# Patient Record
Sex: Female | Born: 1940
Health system: Southern US, Community
[De-identification: ages and names within clinical notes are randomized; demographics above are authoritative.]

## PROBLEM LIST (undated history)

## (undated) DIAGNOSIS — IMO0001 Reserved for inherently not codable concepts without codable children: Secondary | ICD-10-CM

## (undated) DIAGNOSIS — M316 Other giant cell arteritis: Secondary | ICD-10-CM

## (undated) DIAGNOSIS — IMO0002 Reserved for concepts with insufficient information to code with codable children: Secondary | ICD-10-CM

## (undated) DIAGNOSIS — C50919 Malignant neoplasm of unspecified site of unspecified female breast: Secondary | ICD-10-CM

## (undated) HISTORY — DX: Reserved for inherently not codable concepts without codable children: IMO0001

## (undated) HISTORY — DX: Reserved for concepts with insufficient information to code with codable children: IMO0002

## (undated) HISTORY — PX: TUBAL LIGATION: SHX77

---

## 2006-01-25 ENCOUNTER — Emergency Department (HOSPITAL_COMMUNITY): Admission: EM | Admit: 2006-01-25 | Discharge: 2006-01-25 | Payer: Self-pay | Admitting: *Deleted

## 2006-01-26 ENCOUNTER — Emergency Department (HOSPITAL_COMMUNITY): Admission: EM | Admit: 2006-01-26 | Discharge: 2006-01-26 | Payer: Self-pay | Admitting: Emergency Medicine

## 2013-12-26 ENCOUNTER — Other Ambulatory Visit: Payer: Self-pay

## 2013-12-26 ENCOUNTER — Encounter (HOSPITAL_COMMUNITY): Payer: Self-pay | Admitting: Emergency Medicine

## 2013-12-26 ENCOUNTER — Ambulatory Visit: Payer: Medicare HMO

## 2013-12-26 ENCOUNTER — Observation Stay (HOSPITAL_COMMUNITY)
Admission: EM | Admit: 2013-12-26 | Discharge: 2013-12-28 | Disposition: A | Discharge status: Home / Self Care | Payer: Medicare HMO | Attending: Internal Medicine | Admitting: Internal Medicine

## 2013-12-26 ENCOUNTER — Ambulatory Visit (INDEPENDENT_AMBULATORY_CARE_PROVIDER_SITE_OTHER): Payer: Medicare HMO | Admitting: Emergency Medicine

## 2013-12-26 VITALS — BP 104/68 | HR 121 | Temp 99.2°F | Resp 18 | Ht 65.0 in | Wt 138.6 lb

## 2013-12-26 DIAGNOSIS — Z7982 Long term (current) use of aspirin: Secondary | ICD-10-CM | POA: Insufficient documentation

## 2013-12-26 DIAGNOSIS — R6884 Jaw pain: Secondary | ICD-10-CM | POA: Insufficient documentation

## 2013-12-26 DIAGNOSIS — R5383 Other fatigue: Secondary | ICD-10-CM

## 2013-12-26 DIAGNOSIS — R51 Headache: Secondary | ICD-10-CM

## 2013-12-26 DIAGNOSIS — N63 Unspecified lump in unspecified breast: Secondary | ICD-10-CM

## 2013-12-26 DIAGNOSIS — N6320 Unspecified lump in the left breast, unspecified quadrant: Secondary | ICD-10-CM

## 2013-12-26 DIAGNOSIS — R9431 Abnormal electrocardiogram [ECG] [EKG]: Secondary | ICD-10-CM | POA: Diagnosis present

## 2013-12-26 DIAGNOSIS — R634 Abnormal weight loss: Secondary | ICD-10-CM

## 2013-12-26 DIAGNOSIS — M316 Other giant cell arteritis: Principal | ICD-10-CM | POA: Diagnosis present

## 2013-12-26 DIAGNOSIS — R5381 Other malaise: Secondary | ICD-10-CM | POA: Insufficient documentation

## 2013-12-26 LAB — CBC WITH DIFFERENTIAL/PLATELET
Basophils Absolute: 0 10*3/uL (ref 0.0–0.1)
Basophils Relative: 0 % (ref 0–1)
EOS PCT: 0 % (ref 0–5)
Eosinophils Absolute: 0 10*3/uL (ref 0.0–0.7)
HEMATOCRIT: 34.8 % — AB (ref 36.0–46.0)
Hemoglobin: 12 g/dL (ref 12.0–15.0)
LYMPHS ABS: 1 10*3/uL (ref 0.7–4.0)
LYMPHS PCT: 12 % (ref 12–46)
MCH: 29.4 pg (ref 26.0–34.0)
MCHC: 34.5 g/dL (ref 30.0–36.0)
MCV: 85.3 fL (ref 78.0–100.0)
MONO ABS: 1 10*3/uL (ref 0.1–1.0)
Monocytes Relative: 12 % (ref 3–12)
Neutro Abs: 6.1 10*3/uL (ref 1.7–7.7)
Neutrophils Relative %: 76 % (ref 43–77)
Platelets: 395 10*3/uL (ref 150–400)
RBC: 4.08 MIL/uL (ref 3.87–5.11)
RDW: 12.6 % (ref 11.5–15.5)
WBC: 8.1 10*3/uL (ref 4.0–10.5)

## 2013-12-26 LAB — URINALYSIS, ROUTINE W REFLEX MICROSCOPIC
Bilirubin Urine: NEGATIVE
Glucose, UA: NEGATIVE mg/dL
Hgb urine dipstick: NEGATIVE
KETONES UR: 15 mg/dL — AB
Leukocytes, UA: NEGATIVE
NITRITE: NEGATIVE
PH: 8 (ref 5.0–8.0)
Protein, ur: NEGATIVE mg/dL
Specific Gravity, Urine: 1.009 (ref 1.005–1.030)
UROBILINOGEN UA: 1 mg/dL (ref 0.0–1.0)

## 2013-12-26 LAB — POCT URINALYSIS DIPSTICK
BILIRUBIN UA: NEGATIVE
Blood, UA: NEGATIVE
Glucose, UA: NEGATIVE
KETONES UA: 15
LEUKOCYTES UA: NEGATIVE
Nitrite, UA: NEGATIVE
PROTEIN UA: 30
SPEC GRAV UA: 1.01
Urobilinogen, UA: 1
pH, UA: 7

## 2013-12-26 LAB — COMPREHENSIVE METABOLIC PANEL
ALT: 11 U/L (ref 0–35)
AST: 20 U/L (ref 0–37)
Albumin: 3.2 g/dL — ABNORMAL LOW (ref 3.5–5.2)
Alkaline Phosphatase: 92 U/L (ref 39–117)
BUN: 9 mg/dL (ref 6–23)
CO2: 23 meq/L (ref 19–32)
CREATININE: 0.73 mg/dL (ref 0.50–1.10)
Calcium: 9.5 mg/dL (ref 8.4–10.5)
Chloride: 96 mEq/L (ref 96–112)
GFR, EST NON AFRICAN AMERICAN: 83 mL/min — AB (ref >90)
GLUCOSE: 119 mg/dL — AB (ref 70–99)
Potassium: 3.8 mEq/L (ref 3.7–5.3)
Sodium: 137 mEq/L (ref 137–147)
Total Bilirubin: 0.6 mg/dL (ref 0.3–1.2)
Total Protein: 7.3 g/dL (ref 6.0–8.3)

## 2013-12-26 LAB — POCT CBC
GRANULOCYTE PERCENT: 72.9 % (ref 37–80)
HEMATOCRIT: 39.6 % (ref 37.7–47.9)
Hemoglobin: 12.5 g/dL (ref 12.2–16.2)
Lymph, poc: 1.7 (ref 0.6–3.4)
MCH: 28.2 pg (ref 27–31.2)
MCHC: 31.6 g/dL — AB (ref 31.8–35.4)
MCV: 89.5 fL (ref 80–97)
MID (CBC): 0.8 (ref 0–0.9)
MPV: 8.6 fL (ref 0–99.8)
PLATELET COUNT, POC: 416 10*3/uL (ref 142–424)
POC Granulocyte: 6.8 (ref 2–6.9)
POC LYMPH %: 18.1 % (ref 10–50)
POC MID %: 9 %M (ref 0–12)
RBC: 4.43 M/uL (ref 4.04–5.48)
RDW, POC: 13.6 %
WBC: 9.3 10*3/uL (ref 4.6–10.2)

## 2013-12-26 LAB — TROPONIN I
Troponin I: <0.3 ng/mL (ref <0.30)
Troponin I: <0.3 ng/mL (ref <0.30)

## 2013-12-26 LAB — TSH: TSH: 2.428 u[IU]/mL (ref 0.350–4.500)

## 2013-12-26 LAB — T4, FREE: Free T4: 1.18 ng/dL (ref 0.80–1.80)

## 2013-12-26 LAB — POCT SEDIMENTATION RATE: POCT SED RATE: 80 mm/hr — AB (ref 0–22)

## 2013-12-26 MED ORDER — PREDNISONE 50 MG PO TABS
60.0000 mg | ORAL_TABLET | Freq: Every day | ORAL | Status: DC
  Administered 2013-12-27 – 2013-12-28 (×2): 60 mg via ORAL
  Filled 2013-12-26 (×3): qty 1

## 2013-12-26 MED ORDER — ASPIRIN EC 81 MG PO TBEC
162.0000 mg | DELAYED_RELEASE_TABLET | Freq: Every day | ORAL | Status: DC
  Administered 2013-12-26 – 2013-12-27 (×2): 162 mg via ORAL
  Filled 2013-12-26 (×3): qty 2

## 2013-12-26 MED ORDER — HEPARIN SODIUM (PORCINE) 5000 UNIT/ML IJ SOLN
5000.0000 [IU] | Freq: Three times a day (TID) | INTRAMUSCULAR | Status: DC
  Filled 2013-12-26 (×8): qty 1

## 2013-12-26 MED ORDER — SODIUM CHLORIDE 0.9 % IJ SOLN: 3.0000 mL | Freq: Two times a day (BID) | INTRAMUSCULAR | Status: DC

## 2013-12-26 MED ORDER — PREDNISONE 20 MG PO TABS
60.0000 mg | ORAL_TABLET | Freq: Once | ORAL | Status: AC
  Administered 2013-12-26: 60 mg via ORAL
  Filled 2013-12-26: qty 3

## 2013-12-26 MED ORDER — SODIUM CHLORIDE 0.9 % IV SOLN
1000.0000 mg | Freq: Once | INTRAVENOUS | Status: DC
  Filled 2013-12-26: qty 8

## 2013-12-26 MED ORDER — SODIUM CHLORIDE 0.9 % IV BOLUS (SEPSIS)
1000.0000 mL | Freq: Once | INTRAVENOUS | Status: AC
  Administered 2013-12-26: 1000 mL via INTRAVENOUS

## 2013-12-26 MED ORDER — SODIUM CHLORIDE 0.9 % IV SOLN
INTRAVENOUS | Status: DC
  Administered 2013-12-26 (×2): via INTRAVENOUS

## 2013-12-26 NOTE — ED Notes (Signed)
Pt alert, NAD, calm, interactive, skin W&D, resps e/u, speaking in clear complete sentences, ("feel about the same, not better, not worse"), family at [facility].

## 2013-12-26 NOTE — ED Notes (Signed)
Pt went to [facility] to be seen for weakness that started in January. States that weakness is not getting worse just getting tired of it and never had a PCP in last 20 yrs. Loss of appetite, increased weakness, lethargy, and lost 40 lbs. EMS Sinus tach 115, BP 136/102, Resp 18, 99% room air, CBG 154. Alert/Oriented x4

## 2013-12-26 NOTE — ED Notes (Signed)
EKG completed given to EDP.  

## 2013-12-26 NOTE — H&P (Signed)
[facility] History and Physical  CARRINE KROBOTH NTI:[id number] DOB: Nov 16, 1940 DOA: 12/26/2013  Referring physician: EDP PCP: No primary provider on file.   Chief Complaint: Fatigue, weight loss   HPI: Lindsey [name] is a 73 y.o. female who presented to UC earlier today with c/o night sweats, loss of appetite, weakness, cold intolerance.  Symptoms onset mid Jan.  Also noticed lump in L breast, soreness in temples and prominence of vasculature in temples.  Never had a mammogram nor screening colonoscopy, last check up with doctor several years ago.  Review of Systems: No chest pain, no SOB, no DOE, Systems reviewed.  As above, otherwise negative  History reviewed. No pertinent past medical history. Past Surgical History  Procedure Laterality Date  . Tubal ligation     Social History:  reports that she has never smoked. She has never used smokeless tobacco. She reports that she drinks alcohol. She reports that she does not use illicit drugs.  No Known Allergies  Family History  Problem Relation Age of Onset  . Diabetes Paternal Uncle      Prior to Admission medications   Medication Sig Start Date End Date Taking? Authorizing Provider  aspirin EC 81 MG tablet Take 162 mg by mouth at bedtime.   Yes Historical Provider, MD  Multiple Vitamins-Minerals (MULTIVITAMIN WITH MINERALS) tablet Take 1 tablet by mouth daily.   Yes Historical Provider, MD   Physical Exam: Filed Vitals:   12/26/13 1912  BP: 150/83  Pulse: 99  Temp: 98.3 F (36.8 C)  Resp: 17    BP 150/83  Pulse 99  Temp(Src) 98.3 F (36.8 C) (Oral)  Resp 17  Ht $R'5\' 6"'VY$  (1.676 m)  Wt 62.596 kg (138 lb)  BMI 22.28 kg/m2  SpO2 99%  General Appearance:    Alert, oriented, no distress, appears stated age  Head:    Normocephalic, atraumatic, Prominance of the temporal arteries, bilaterally but especially so on the R side, tender.  Eyes:    PERRL, EOMI, sclera non-icteric        Nose:   Nares without drainage  or epistaxis. Mucosa, turbinates normal  Throat:   Moist mucous membranes. Oropharynx without erythema or exudate.  Neck:   Supple. No carotid bruits.  No thyromegaly.  No lymphadenopathy.   Back:     No CVA tenderness, no spinal tenderness  Lungs:     Clear to auscultation bilaterally, without wheezes, rhonchi or rales  Chest wall:    No tenderness to palpitation, 6x6 cm mobile mass superior left breast.  Heart:    Regular rate and rhythm without murmurs, gallops, rubs  Abdomen:     Soft, non-tender, nondistended, normal bowel sounds, no organomegaly  Genitalia:    deferred  Rectal:    deferred  Extremities:   No clubbing, cyanosis or edema.  Pulses:   2+ and symmetric all extremities  Skin:   Skin color, texture, turgor normal, no rashes or lesions  Lymph nodes:   Cervical, supraclavicular, and axillary nodes normal  Neurologic:   CNII-XII intact. Normal strength, sensation and reflexes      throughout    Labs on Admission:  Basic Metabolic Panel:  Recent Labs Lab 12/26/13 1600  NA 137  K 3.8  CL 96  CO2 23  GLUCOSE 119*  BUN 9  CREATININE 0.73  CALCIUM 9.5   Liver Function Tests:  Recent Labs Lab 12/26/13 1600  AST 20  ALT 11  ALKPHOS 92  BILITOT 0.6  PROT  7.3  ALBUMIN 3.2*   No results found for this basename: LIPASE, AMYLASE,  in the last 168 hours No results found for this basename: AMMONIA,  in the last 168 hours CBC:  Recent Labs Lab 12/26/13 1343 12/26/13 1600  WBC 9.3 8.1  NEUTROABS  --  6.1  HGB 12.5 12.0  HCT 39.6 34.8*  MCV 89.5 85.3  PLT  --  395   Cardiac Enzymes:  Recent Labs Lab 12/26/13 1600  TROPONINI <0.30    BNP (last 3 results) No results found for this basename: PROBNP,  in the last 8760 hours CBG: No results found for this basename: GLUCAP,  in the last 168 hours  Radiological Exams on Admission: Dg Chest 2 View  12/26/2013   CLINICAL DATA:  Left breast mass.  Weight loss.  EXAM: CHEST  2 VIEW  COMPARISON:  None.   FINDINGS: Cardiac silhouette normal in size. Thoracic aorta mildly atherosclerotic. Hilar and mediastinal contours otherwise unremarkable. Lungs hyperinflated. Lungs clear. Bronchovascular markings normal. Pulmonary vascularity normal. No visible pleural effusions. No pneumothorax. Calcification of the central bronchial cartilages. Degenerative changes involving the thoracic spine.  IMPRESSION: Hyperinflation consistent with COPD and/or asthma. No acute cardiopulmonary disease.  No discrepancy with the original interpretation by Dr. Everlene Farrier.   Electronically Signed   By: Evangeline Dakin M.D.   On: 12/26/2013 15:05    EKG: Independently reviewed.  Demonstrates Anterior T wave inversions.  Assessment/Plan Principal Problem:   Temporal arteritis Active Problems:   Breast mass, left   Abnormal resting ECG findings   1. Temporal arteritis - elevated ESR, temporal artery prominance, and symptoms suspicious for temporal arteritis.  Empiric treatment with prednisone 13m daily, first dose now.  Will need temporal artery biopsy. 2. Breast mass, left - diagnostic mammography ordered, may need [place]/ biopsy as well depending on findings. 3. Abnormal resting ECG - serial troponins ordered, patient NPO after midnight in case stress test is desired.    Code Status: Full  Family Communication: [name] Disposition Plan: Admit to inpatient   Time spent: 768min  Crystle Carelli M. [facility] Pager [phone] If 7AM-7PM, please contact the day team taking care of the patient [email or web address] Password [facility]2/16/2015, 8:05 PM

## 2013-12-26 NOTE — ED Provider Notes (Signed)
Medical screening examination/treatment/procedure(s) were conducted as a shared visit with non-physician practitioner(s) and myself.  I personally evaluated the patient during the encounter.    Date: 12/26/2013  Rate: 107  Rhythm: sinus tachycardia  QRS Axis: normal  Intervals: normal  ST/T Wave abnormalities: nonspecific ST/T changes  Conduction Disutrbances:none  Narrative Interpretation:   Old EKG Reviewed: none available    Patient with multiple problems. Her exam is concerning for temporal arteritis, will start on high dose steroids. Her weight loss and breast mass with anorexia are concerning for breast cancer. She has no follow up at this time and never sees a doctor. She also has exertional fatigue with concerning EKG. Could be related to the likely cancer, but with nonspecific ST and T waves I will admit for serial troponins, EKGs and cardiac w/u in addition to her other problems.  Ephraim Hamburger, MD 12/26/13 [phone]

## 2013-12-26 NOTE — Progress Notes (Signed)
Subjective:    Patient ID: Lindsey [name], female    DOB: 03-Aug-1941, 73 y.o.   MRN: [id number]  HPI 73 y.o. Female widow presents to clinic with night sweats, loss of appetite,weakness, cold intolerance since mid January. States that she often has to get up at night to change pajamas due to these night sweats. Has noticed herself having some indigestion and soft/lose stools. Reports that she doesn't have a family dr. Shann Medal that she noticed a lump in her left breast, soreness in her temples and her face breaking out. States that she hasn't been seen for these symptoms before. Has never had a mammogram or screening colonoscopy. States that last check up with a doctor was several years ago.    Review of Systems     Objective:   Physical Exam patient appears ill but not toxic. She has bilateral prominent temporal arteries with multiple branches. They're tender to touch. Her neck is supple chest is clear. Cardiac revealed a very rapid rhythm slightly irregular. There is a 6 x 6 cm mobile mass superior left breast above the areola. There is a pigmented lesion left anterior chest and also left mid abdomen. There are no evident dominant masses palpable. Extremities are without edema. Results for orders placed in visit on 12/26/13  POCT CBC      Result Value Ref Range   WBC 9.3  4.6 - 10.2 K/uL   Lymph, poc 1.7  0.6 - 3.4   POC LYMPH PERCENT 18.1  10 - 50 %L   MID (cbc) 0.8  0 - 0.9   POC MID % 9.0  0 - 12 %M   POC Granulocyte 6.8  2 - 6.9   Granulocyte percent 72.9  37 - 80 %G   RBC 4.43  4.04 - 5.48 M/uL   Hemoglobin 12.5  12.2 - 16.2 g/dL   HCT, POC 39.6  37.7 - 47.9 %   MCV 89.5  80 - 97 fL   MCH, POC 28.2  27 - 31.2 pg   MCHC 31.6 (*) 31.8 - 35.4 g/dL   RDW, POC 13.6     Platelet Count, POC 416  142 - 424 K/uL   MPV 8.6  0 - 99.8 fL  POCT URINALYSIS DIPSTICK      Result Value Ref Range   Color, UA yellow     Clarity, UA clear     Glucose, UA neg     Bilirubin, UA neg     Ketones, UA 15     Spec Grav, UA 1.010     Blood, UA neg     pH, UA 7.0     Protein, UA 30     Urobilinogen, UA 1.0     Nitrite, UA neg     Leukocytes, UA Negative     [facility] reading (PRIMARY) by  Dr. Everlene Farrier is an increased AP diameter but I do not see any lung masses. EKG sinus tachycardia wit rate of 114 your is borderline short and there is diffuse ST changes 23 aVF V1 to V3       Assessment & Plan:  Problem #1 prominent temporal arteries suspicious for temporal arteritis problem #2 weight loss of approximately 40 pounds since last summer problem #3 abnormal EKG with tachycardia and diffuse ST-T changes. Problem #4 mass of the left breast approximately 6 x 7 cm problem #5 no health screening in the last 25 years for her multiple medical problems that could explain her situation. The  prominent temporal arteries and what appears to be a rising sedimentation rate could be related to temporal arteritis the weight loss is very worrisome but possibly could be secondary to thyroid disease. The mass in her left breast needs mammography and biopsy if needed. I did call the daughter at [phone] whose name is Wells Guiles and advised her of the situation . The patient also has 2 pigmented lesions on the abdomen which need to be biopsied. The patient's chest x-ray looked like COPD but I did not see any definite masses the reading from the radiologist is still pending

## 2013-12-26 NOTE — ED Provider Notes (Signed)
CSN: [id number]     Arrival date & time 12/26/13  1503 History   First MD Initiated Contact with Patient 12/26/13 1533     Chief Complaint  Patient presents with  . Abnormal ECG  . Weakness     (Consider location/radiation/quality/duration/timing/severity/associated sxs/prior Treatment) HPI 73 yo female presents by referral from UC with multiple complaints of night sweats, weight loss (23-24 lbs), and weakness since January. Admits to cold interolance "for years". Admits to "indigestion" and few intermittent episodes of loose stools. Denies Nausea, vomiting, constipation. Denies chest pain, shortness of breath, HA, dizziness, visual changes, fever. Admits to red rash on chest in the past week. Denies urinary sxs. Denies bloody or painful stools. Admits to difficulty opening jaw over the past 3 days. Denies alcohol or tobacco use.  Patient has not ever had screening mammogram or colonoscopy. Patient denies having seen a doctor in "years".    History reviewed. No pertinent past medical history. Past Surgical History  Procedure Laterality Date  . Tubal ligation     Family History  Problem Relation Age of Onset  . Diabetes Paternal Uncle    History  Substance Use Topics  . Smoking status: Never Smoker   . Smokeless tobacco: Never Used  . Alcohol Use: Yes     Comment: rare occaisons glass of wine   OB History   Grav Para Term Preterm Abortions TAB SAB Ect Mult Living                 Review of Systems  All other systems reviewed and are negative.      Allergies  Review of patient's allergies indicates no known allergies.  Home Medications   Current Outpatient Rx  Name  Route  Sig  Dispense  Refill  . aspirin EC 81 MG tablet   Oral   Take 162 mg by mouth at bedtime.         . Multiple Vitamins-Minerals (MULTIVITAMIN WITH MINERALS) tablet   Oral   Take 1 tablet by mouth daily.          BP 140/89  Pulse 104  Temp(Src) 98.3 F (36.8 C) (Oral)  Resp 25  Ht 5'  6" (1.676 m)  Wt 138 lb (62.596 kg)  BMI 22.28 kg/m2  SpO2 99% Physical Exam  Nursing note and vitals reviewed. Constitutional: She is oriented to person, place, and time. She appears well-developed and well-nourished. No distress.  HENT:  Head: Normocephalic and atraumatic.    Right Ear: Tympanic membrane and ear canal normal. No drainage. Tympanic membrane is not erythematous.  Left Ear: Tympanic membrane and ear canal normal. No drainage. Tympanic membrane is not erythematous.  Nose: Nose normal. Right sinus exhibits no maxillary sinus tenderness and no frontal sinus tenderness. Left sinus exhibits no maxillary sinus tenderness and no frontal sinus tenderness.  Mouth/Throat: Uvula is midline, oropharynx is clear and moist and mucous membranes are normal. There is trismus in the jaw. No uvula swelling. No oropharyngeal exudate, posterior oropharyngeal edema or posterior oropharyngeal erythema.  Eyes: Conjunctivae and EOM are normal. Pupils are equal, round, and reactive to light. Right eye exhibits no discharge. Left eye exhibits no discharge. No scleral icterus.  Neck: Normal range of motion and phonation normal. Neck supple. No JVD present. No rigidity. No tracheal deviation, no edema and no erythema present.  Cardiovascular: Normal rate, regular rhythm and intact distal pulses.  Exam reveals no gallop and no friction rub.   No murmur heard. Pulmonary/Chest: Effort normal  and breath sounds normal. No stridor. No respiratory distress. She has no decreased breath sounds. She has no wheezes. She has no rhonchi. She has no rales. Right breast exhibits no inverted nipple, no mass, no nipple discharge, no skin change and no tenderness. Left breast exhibits mass. Left breast exhibits no inverted nipple, no nipple discharge, no skin change and no tenderness.  Large firm mass of left breast. Non tender to palpation. No notable skin changes no axillary nodes palpated.   Patient also noted to have a  scattered rash across upper chest. Small erythematous maculopapular plaque-like lesions. Non pruritic, non painful.   Abdominal: Soft. Bowel sounds are normal. She exhibits no distension. There is no hepatosplenomegaly. There is no tenderness. There is no rigidity, no rebound, no guarding, no tenderness at McBurney's point and negative Murphy's sign.  Musculoskeletal: Normal range of motion. She exhibits no edema.  Lymphadenopathy:       Head (right side): Submandibular adenopathy present.       Head (left side): Submandibular adenopathy present.    She has no cervical adenopathy.    She has no axillary adenopathy.       Right: No supraclavicular adenopathy present.       Left: No supraclavicular adenopathy present.  Neurological: She is alert and oriented to person, place, and time.  Skin: Skin is warm and dry. She is not diaphoretic.  Psychiatric: She has a normal mood and affect. Her behavior is normal.    ED Course  Procedures (including critical care time) Labs Review Labs Reviewed  CBC WITH DIFFERENTIAL - Abnormal; Notable for the following:    HCT 34.8 (*)    All other components within normal limits  COMPREHENSIVE METABOLIC PANEL - Abnormal; Notable for the following:    Glucose, Bld 119 (*)    Albumin 3.2 (*)    GFR calc non Af Amer 83 (*)    All other components within normal limits  URINALYSIS, ROUTINE W REFLEX MICROSCOPIC - Abnormal; Notable for the following:    Ketones, ur 15 (*)    All other components within normal limits  TROPONIN I  TSH  T4, FREE  C-REACTIVE PROTEIN  TROPONIN I  TROPONIN I  TROPONIN I  TSH   Imaging Review Dg Chest 2 View  12/26/2013   CLINICAL DATA:  Left breast mass.  Weight loss.  EXAM: CHEST  2 VIEW  COMPARISON:  None.  FINDINGS: Cardiac silhouette normal in size. Thoracic aorta mildly atherosclerotic. Hilar and mediastinal contours otherwise unremarkable. Lungs hyperinflated. Lungs clear. Bronchovascular markings normal. Pulmonary  vascularity normal. No visible pleural effusions. No pneumothorax. Calcification of the central bronchial cartilages. Degenerative changes involving the thoracic spine.  IMPRESSION: Hyperinflation consistent with COPD and/or asthma. No acute cardiopulmonary disease.  No discrepancy with the original interpretation by Dr. Everlene Farrier.   Electronically Signed   By: Evangeline Dakin M.D.   On: 12/26/2013 15:05    EKG Interpretation   None       MDM   Final diagnoses:  Breast mass, left  Abnormal resting ECG findings  Temporal arteritis    Patient afebrile.  Fluctuating tachycardia.  Troponin negative CXR shows hyperinflation but no acute findings.  Normal E-lytes No leukocytosis Elevated ESR at 80 from UC TSH/T4 - pending.  CRP - Pending  Patient discussed with Dr. Regenia Skeeter.   Plan to have patient admitted for further work up and treatment of suspect temporal arteritis, LEFT breast mass, and abnormal ECG.  Sherrie George, PA-C 12/26/13 2042

## 2013-12-26 NOTE — ED Notes (Signed)
Initial IVF bolus continuing, prednisone taken w/o difficulty, up to b/w, steady gait, daughter at [facility]. VSS.

## 2013-12-27 ENCOUNTER — Encounter (HOSPITAL_COMMUNITY)
Admission: EM | Disposition: A | Discharge status: Home / Self Care | Payer: Self-pay | Source: Home / Self Care | Attending: Internal Medicine

## 2013-12-27 ENCOUNTER — Encounter (HOSPITAL_COMMUNITY): Payer: Medicare HMO | Admitting: Anesthesiology

## 2013-12-27 ENCOUNTER — Inpatient Hospital Stay (HOSPITAL_COMMUNITY): Payer: Medicare HMO | Admitting: Anesthesiology

## 2013-12-27 ENCOUNTER — Encounter (HOSPITAL_COMMUNITY): Payer: Self-pay | Admitting: General Surgery

## 2013-12-27 DIAGNOSIS — M316 Other giant cell arteritis: Secondary | ICD-10-CM

## 2013-12-27 DIAGNOSIS — R61 Generalized hyperhidrosis: Secondary | ICD-10-CM

## 2013-12-27 DIAGNOSIS — N63 Unspecified lump in unspecified breast: Secondary | ICD-10-CM

## 2013-12-27 HISTORY — PX: ARTERY BIOPSY: SHX891

## 2013-12-27 LAB — TSH: TSH: 2.391 u[IU]/mL (ref 0.350–4.500)

## 2013-12-27 LAB — COMPREHENSIVE METABOLIC PANEL
ALT: 12 U/L (ref 0–35)
AST: 21 U/L (ref 0–37)
Albumin: 3.6 g/dL (ref 3.5–5.2)
Alkaline Phosphatase: 93 U/L (ref 39–117)
BILIRUBIN TOTAL: 0.6 mg/dL (ref 0.2–1.2)
BUN: 10 mg/dL (ref 6–23)
CALCIUM: 9.2 mg/dL (ref 8.4–10.5)
CHLORIDE: 96 meq/L (ref 96–112)
CO2: 25 meq/L (ref 19–32)
Creat: 0.68 mg/dL (ref 0.50–1.10)
GLUCOSE: 133 mg/dL — AB (ref 70–99)
Potassium: 4.1 mEq/L (ref 3.5–5.3)
Sodium: 132 mEq/L — ABNORMAL LOW (ref 135–145)
TOTAL PROTEIN: 6.7 g/dL (ref 6.0–8.3)

## 2013-12-27 LAB — TROPONIN I

## 2013-12-27 LAB — T4, FREE: FREE T4: 1.34 ng/dL (ref 0.80–1.80)

## 2013-12-27 LAB — C-REACTIVE PROTEIN: CRP: 14 mg/dL — ABNORMAL HIGH (ref <0.60)

## 2013-12-27 SURGERY — BIOPSY TEMPORAL ARTERY
Anesthesia: Monitor Anesthesia Care | Site: Head | Laterality: Right

## 2013-12-27 MED ORDER — CEFAZOLIN SODIUM-DEXTROSE 2-3 GM-% IV SOLR
INTRAVENOUS | Status: AC
  Filled 2013-12-27: qty 50

## 2013-12-27 MED ORDER — PROPOFOL 10 MG/ML IV BOLUS
INTRAVENOUS | Status: AC
  Filled 2013-12-27: qty 20

## 2013-12-27 MED ORDER — LACTATED RINGERS IV SOLN
INTRAVENOUS | Status: DC | PRN
  Administered 2013-12-27 (×2): via INTRAVENOUS

## 2013-12-27 MED ORDER — CEFAZOLIN SODIUM-DEXTROSE 2-3 GM-% IV SOLR
2.0000 g | INTRAVENOUS | Status: AC
  Administered 2013-12-27: 2 g via INTRAVENOUS
  Filled 2013-12-27: qty 50

## 2013-12-27 MED ORDER — FENTANYL CITRATE 0.05 MG/ML IJ SOLN: 25.0000 ug | INTRAMUSCULAR | Status: DC | PRN

## 2013-12-27 MED ORDER — LIDOCAINE HCL (CARDIAC) 20 MG/ML IV SOLN
INTRAVENOUS | Status: DC | PRN
  Administered 2013-12-27: 40 mg via INTRAVENOUS

## 2013-12-27 MED ORDER — LIDOCAINE HCL (PF) 1 % IJ SOLN
INTRAMUSCULAR | Status: AC
  Filled 2013-12-27: qty 30

## 2013-12-27 MED ORDER — ONDANSETRON HCL 4 MG/2ML IJ SOLN
INTRAMUSCULAR | Status: DC | PRN
  Administered 2013-12-27: 4 mg via INTRAVENOUS

## 2013-12-27 MED ORDER — OXYCODONE HCL 5 MG/5ML PO SOLN: 5.0000 mg | Freq: Once | ORAL | Status: DC | PRN

## 2013-12-27 MED ORDER — BUPIVACAINE HCL (PF) 0.25 % IJ SOLN
INTRAMUSCULAR | Status: AC
  Filled 2013-12-27: qty 30

## 2013-12-27 MED ORDER — MORPHINE SULFATE 2 MG/ML IJ SOLN: 1.0000 mg | INTRAMUSCULAR | Status: DC | PRN

## 2013-12-27 MED ORDER — MIDAZOLAM HCL 2 MG/2ML IJ SOLN
INTRAMUSCULAR | Status: AC
  Filled 2013-12-27: qty 2

## 2013-12-27 MED ORDER — ONDANSETRON HCL 4 MG/2ML IJ SOLN: 4.0000 mg | Freq: Four times a day (QID) | INTRAMUSCULAR | Status: DC | PRN

## 2013-12-27 MED ORDER — 0.9 % SODIUM CHLORIDE (POUR BTL) OPTIME
TOPICAL | Status: DC | PRN
  Administered 2013-12-27: 1000 mL

## 2013-12-27 MED ORDER — OXYCODONE HCL 5 MG PO TABS: 5.0000 mg | ORAL_TABLET | Freq: Once | ORAL | Status: DC | PRN

## 2013-12-27 MED ORDER — BUPIVACAINE-EPINEPHRINE (PF) 0.25% -1:200000 IJ SOLN
INTRAMUSCULAR | Status: AC
  Filled 2013-12-27: qty 30

## 2013-12-27 MED ORDER — LIDOCAINE HCL (CARDIAC) 20 MG/ML IV SOLN
INTRAVENOUS | Status: AC
  Filled 2013-12-27: qty 5

## 2013-12-27 MED ORDER — FENTANYL CITRATE 0.05 MG/ML IJ SOLN
INTRAMUSCULAR | Status: AC
  Filled 2013-12-27: qty 5

## 2013-12-27 MED ORDER — HYDROCODONE-ACETAMINOPHEN 5-325 MG PO TABS: 1.0000 | ORAL_TABLET | ORAL | Status: DC | PRN

## 2013-12-27 MED ORDER — LIDOCAINE HCL 1 % IJ SOLN
INTRAMUSCULAR | Status: DC | PRN
  Administered 2013-12-27: 15:00:00

## 2013-12-27 MED ORDER — LACTATED RINGERS IV SOLN: INTRAVENOUS | Status: DC

## 2013-12-27 MED ORDER — ROCURONIUM BROMIDE 50 MG/5ML IV SOLN
INTRAVENOUS | Status: AC
  Filled 2013-12-27: qty 1

## 2013-12-27 MED ORDER — MIDAZOLAM HCL 5 MG/5ML IJ SOLN
INTRAMUSCULAR | Status: DC | PRN
  Administered 2013-12-27: 2 mg via INTRAVENOUS

## 2013-12-27 MED ORDER — PROPOFOL INFUSION 10 MG/ML OPTIME
INTRAVENOUS | Status: DC | PRN
  Administered 2013-12-27: 75 ug/kg/min via INTRAVENOUS

## 2013-12-27 SURGICAL SUPPLY — 52 items
ADH SKN CLS APL DERMABOND .7 (GAUZE/BANDAGES/DRESSINGS) ×1
BLADE SURG 15 STRL LF DISP TIS (BLADE) ×1 IMPLANT
BLADE SURG 15 STRL SS (BLADE) ×2
CANISTER SUCTION 2500CC (MISCELLANEOUS) ×1 IMPLANT
CLIP TI WIDE RED SMALL 6 (CLIP) ×1 IMPLANT
COVER SURGICAL LIGHT HANDLE (MISCELLANEOUS) ×2 IMPLANT
CRADLE DONUT ADULT HEAD (MISCELLANEOUS) ×2 IMPLANT
DERMABOND ADVANCED (GAUZE/BANDAGES/DRESSINGS) ×1
DERMABOND ADVANCED .7 DNX12 (GAUZE/BANDAGES/DRESSINGS) ×1 IMPLANT
DRAPE PED LAPAROTOMY (DRAPES) ×2 IMPLANT
DRAPE UTILITY 15X26 W/TAPE STR (DRAPE) ×2 IMPLANT
ELECT CAUTERY BLADE 6.4 (BLADE) ×2 IMPLANT
ELECT REM PT RETURN 9FT ADLT (ELECTROSURGICAL) ×2
ELECTRODE REM PT RTRN 9FT ADLT (ELECTROSURGICAL) ×1 IMPLANT
GAUZE SPONGE 4X4 16PLY XRAY LF (GAUZE/BANDAGES/DRESSINGS) ×2 IMPLANT
GEL ULTRASOUND 20GR AQUASONIC (MISCELLANEOUS) ×2 IMPLANT
GLOVE BIO SURGEON STRL SZ8 (GLOVE) ×1 IMPLANT
GLOVE BIOGEL M STRL SZ7.5 (GLOVE) ×1 IMPLANT
GLOVE BIOGEL PI IND STRL 6.5 (GLOVE) IMPLANT
GLOVE BIOGEL PI IND STRL 7.0 (GLOVE) IMPLANT
GLOVE BIOGEL PI IND STRL 8 (GLOVE) ×1 IMPLANT
GLOVE BIOGEL PI INDICATOR 6.5 (GLOVE) ×1
GLOVE BIOGEL PI INDICATOR 7.0 (GLOVE) ×1
GLOVE BIOGEL PI INDICATOR 8 (GLOVE)
GLOVE SURG SS PI 7.0 STRL IVOR (GLOVE) ×1 IMPLANT
GOWN STRL NON-REIN LRG LVL3 (GOWN DISPOSABLE) ×2 IMPLANT
GOWN STRL REIN XL XLG (GOWN DISPOSABLE) ×2 IMPLANT
HEMOSTAT SURGICEL 2X14 (HEMOSTASIS) IMPLANT
KIT BASIN OR (CUSTOM PROCEDURE TRAY) ×2 IMPLANT
KIT ROOM TURNOVER OR (KITS) ×2 IMPLANT
NEEDLE 22X1 1/2 (OR ONLY) (NEEDLE) ×1 IMPLANT
NS IRRIG 1000ML POUR BTL (IV SOLUTION) ×2 IMPLANT
PACK SURGICAL SETUP 50X90 (CUSTOM PROCEDURE TRAY) ×2 IMPLANT
PAD ARMBOARD 7.5X6 YLW CONV (MISCELLANEOUS) ×4 IMPLANT
PENCIL BUTTON HOLSTER BLD 10FT (ELECTRODE) ×2 IMPLANT
SPECIMEN JAR SMALL (MISCELLANEOUS) ×2 IMPLANT
SPONGE INTESTINAL PEANUT (DISPOSABLE) IMPLANT
SUCTION FRAZIER TIP 10 FR DISP (SUCTIONS) ×1 IMPLANT
SUT MNCRL AB 4-0 PS2 18 (SUTURE) ×1 IMPLANT
SUT SILK 2 0 (SUTURE) ×2
SUT SILK 2-0 18XBRD TIE 12 (SUTURE) IMPLANT
SUT VIC AB 3-0 54X BRD REEL (SUTURE) ×1 IMPLANT
SUT VIC AB 3-0 BRD 54 (SUTURE)
SUT VIC AB 3-0 SH 18 (SUTURE) ×1 IMPLANT
SUT VIC AB 4-0 P-3 18X BRD (SUTURE) ×1 IMPLANT
SUT VIC AB 4-0 P3 18 (SUTURE)
SYR BULB 3OZ (MISCELLANEOUS) ×1 IMPLANT
SYR CONTROL 10ML LL (SYRINGE) ×2 IMPLANT
TOWEL OR 17X24 6PK STRL BLUE (TOWEL DISPOSABLE) ×1 IMPLANT
TOWEL OR 17X26 10 PK STRL BLUE (TOWEL DISPOSABLE) ×2 IMPLANT
TUBE CONNECTING 12X1/4 (SUCTIONS) ×1 IMPLANT
WATER STERILE IRR 1000ML POUR (IV SOLUTION) IMPLANT

## 2013-12-27 NOTE — Consult Note (Signed)
Reason for Consult:Temporal tenderness Referring Physician: Rai  CC: Temporal tenderness  HPI: Lindsey [name] is an 73 y.o. female who reports that she wen to he clinic yesterday with multiple complaints.  One of them was pain at her temples bilaterally, in her jaws and in her neck.  She was not aware of any visual complaints until she kept being asked about them and she then recalled that she had a few episodes over the past few weeks when she would have some blurry vision.  These spells were short-lived and resolved spontaneously.  Patient admitted for further evaluation.  History reviewed. No pertinent past medical history.  Past Surgical History  Procedure Laterality Date  . Tubal ligation      Family History  Problem Relation Age of Onset  . Diabetes Paternal Uncle     Social History:  reports that she has never smoked. She has never used smokeless tobacco. She reports that she drinks alcohol. She reports that she does not use illicit drugs.  No Known Allergies  Medications:  I have reviewed the patient's current medications. Prior to Admission:  Prescriptions prior to admission  Medication Sig Dispense Refill  . aspirin EC 81 MG tablet Take 162 mg by mouth at bedtime.      . Multiple Vitamins-Minerals (MULTIVITAMIN WITH MINERALS) tablet Take 1 tablet by mouth daily.       Scheduled: . aspirin EC  162 mg Oral QHS  .  ceFAZolin (ANCEF) IV  2 g Intravenous On Call  . heparin  5,000 Units Subcutaneous 3 times per day  . predniSONE  60 mg Oral Q breakfast  . sodium chloride  3 mL Intravenous Q12H    ROS: History obtained from the patient  General ROS: night sweats, loss of appetite Psychological ROS: negative for - behavioral disorder, hallucinations, memory difficulties, mood swings or suicidal ideation Ophthalmic ROS: negative for - blurry vision, double vision, eye pain or loss of vision ENT ROS: negative for - epistaxis, nasal discharge, oral lesions, sore throat,  tinnitus or vertigo Allergy and Immunology ROS: negative for - hives or itchy/watery eyes Hematological and Lymphatic ROS: negative for - bleeding problems, bruising or swollen lymph nodes Endocrine ROS:  temperature intolerance Respiratory ROS: negative for - cough, hemoptysis, shortness of breath or wheezing Cardiovascular ROS: negative for - chest pain, dyspnea on exertion, edema or irregular heartbeat Gastrointestinal ROS: negative for - abdominal pain, diarrhea, hematemesis, nausea/vomiting or stool incontinence Genito-Urinary ROS: negative for - dysuria, hematuria, incontinence or urinary frequency/urgency Musculoskeletal ROS:  weakness Neurological ROS: as noted in HPI Dermatological ROS: negative for rash and skin lesion changes  Physical Examination: Blood pressure 129/68, pulse 92, temperature 98.3 F (36.8 C), temperature source Oral, resp. rate 20, height 5' 6" (1.676 m), weight 63.322 kg (139 lb 9.6 oz), SpO2 97.00%.  Neurologic Examination Mental Status: Alert, oriented, thought content appropriate.  Speech fluent without evidence of aphasia.  Able to follow 3 step commands without difficulty. Cranial Nerves: II: Discs flat bilaterally; Visual fields grossly normal, pupils equal, round, reactive to light and accommodation III,IV, VI: ptosis not present, extra-ocular motions intact bilaterally V,VII: smile symmetric, facial light touch sensation normal bilaterally VIII: hearing normal bilaterally IX,X: gag reflex present XI: bilateral shoulder shrug XII: midline tongue extension Motor: Right : Upper extremity   5/5    Left:     Upper extremity   5/5  Lower extremity   5/5     Lower extremity   5/5 Tone and  bulk:normal tone throughout; no atrophy noted Sensory: Pinprick and light touch intact throughout, bilaterally Deep Tendon Reflexes: 2+ and symmetric throughout Plantars: Right: downgoing   Left: downgoing Cerebellar: normal finger-to-nose, normal rapid alternating  movements and normal heel-to-shin test Gait: normal gait and station CV: pulses palpable throughout     Laboratory Studies:   Basic Metabolic Panel:  Recent Labs Lab 12/26/13 1336 12/26/13 1600  NA 132* 137  K 4.1 3.8  CL 96 96  CO2 25 23  GLUCOSE 133* 119*  BUN 10 9  CREATININE 0.68 0.73  CALCIUM 9.2 9.5    Liver Function Tests:  Recent Labs Lab 12/26/13 1336 12/26/13 1600  AST 21 20  ALT 12 11  ALKPHOS 93 92  BILITOT 0.6 0.6  PROT 6.7 7.3  ALBUMIN 3.6 3.2*   No results found for this basename: LIPASE, AMYLASE,  in the last 168 hours No results found for this basename: AMMONIA,  in the last 168 hours  CBC:  Recent Labs Lab 12/26/13 1343 12/26/13 1600  WBC 9.3 8.1  NEUTROABS  --  6.1  HGB 12.5 12.0  HCT 39.6 34.8*  MCV 89.5 85.3  PLT  --  395    Cardiac Enzymes:  Recent Labs Lab 12/26/13 1600 12/26/13 2011 12/27/13 0438 12/27/13 0658  TROPONINI <0.30 <0.30 <0.30 <0.30    BNP: No components found with this basename: POCBNP,   CBG: No results found for this basename: GLUCAP,  in the last 168 hours  Microbiology: No results found for this or any previous visit.  Coagulation Studies: No results found for this basename: LABPROT, INR,  in the last 72 hours  Urinalysis:  Recent Labs Lab 12/26/13 1343 12/26/13 1551  COLORURINE  --  YELLOW  LABSPEC  --  1.009  PHURINE  --  8.0  GLUCOSEU  --  NEGATIVE  HGBUR  --  NEGATIVE  BILIRUBINUR neg NEGATIVE  KETONESUR  --  15*  PROTEINUR  --  NEGATIVE  UROBILINOGEN 1.0 1.0  NITRITE neg NEGATIVE  LEUKOCYTESUR Negative NEGATIVE    Lipid Panel:  No results found for this basename: chol, trig, hdl, cholhdl, vldl, ldlcalc    HgbA1C:  No results found for this basename: HGBA1C    Urine Drug Screen:   No results found for this basename: labopia, cocainscrnur, labbenz, amphetmu, thcu, labbarb    Alcohol Level: No results found for this basename: ETH,  in the last 168 hours  Other  results: EKG: sinus tachycardia at 107 bpm.  Imaging: Dg Chest 2 View  12/26/2013   CLINICAL DATA:  Left breast mass.  Weight loss.  EXAM: CHEST  2 VIEW  COMPARISON:  None.  FINDINGS: Cardiac silhouette normal in size. Thoracic aorta mildly atherosclerotic. Hilar and mediastinal contours otherwise unremarkable. Lungs hyperinflated. Lungs clear. Bronchovascular markings normal. Pulmonary vascularity normal. No visible pleural effusions. No pneumothorax. Calcification of the central bronchial cartilages. Degenerative changes involving the thoracic spine.  IMPRESSION: Hyperinflation consistent with COPD and/or asthma. No acute cardiopulmonary disease.  No discrepancy with the original interpretation by Dr. Everlene Farrier.   Electronically Signed   By: Evangeline Dakin M.D.   On: 12/26/2013 15:05     Assessment/Plan: 73 year old female presenting with complaints of generalized weakness, bitemporal tenderness, and intermittent visual disturbances.  CRP 14.0.  ESR 80.  Temporal arteritis likely.  Patient started on steroids.  Currently on 40m daily.    Recommendations: 1.  Temporal artery biopsy 2.  Continue prednisone at current dose 3.  No further neurologic intervention is recommended at this time.  If further questions arise, please call or page at that time.  Thank you for allowing neurology to participate in the care of this patient.   Alexis Goodell, MD [facility] [phone] 12/27/2013, 1:38 PM

## 2013-12-27 NOTE — Consult Note (Signed)
Reason for Consult: temporal artery biopsy and left breast mass Referring Physician: Dr. Estill Cotta   HPI: Lindsey [name] is a relatively healthy 73 year old female who presented to [facility] yesterday with complaints of night sweats and weight loss.  She reports 22-23lb unintentional weight loss over the last 6 months.  She was admitted for suspected temporal arteritis and a left beast mass.  She has a ESR of 80, CRP of 14.  A normal TSH, CBC, LFTs showed a low albumin level.  She had an abnormal ECG with negative troponin's x4.  We have been asked to see the patient for further evaluation of the breast mass and temporal artery biopsy.   1) left beast mass.  The patient first noticed this back in November.  She denies pain, nipple discharge or inversion.  It has not changed in size.  She denies fever, erythema around the site.  She has never had a mammogram.  She denies a family history of cancer, particularly breast, ovarian, uterine and colon cancer.  Her symptoms are associated with anorexia, malaise, night sweats an weight loss. 2) presumed temporal arteritis: the patient reports she noticed swelling to bilateral temporal region mid January.  Associate with tenderness, night sweats.  She also reports inability to focus and blurred vision x2 episodes last week.  This has since resolved.  She denies vision loss headaches.  No aggravating or alleviating factors.  No modifying factors.  Mild in severity.  Coarse is unchanged, left she reports has resolved, but increased in size on the right.  She has been started on prednisone.  She takes 2 ASA $Rem'81mg'mjwd$  at HS.    Past surgeries include a tubal ligation.  She is not up to date on preventative care ie mammogram, colonoscopy.   History reviewed. No pertinent past medical history.  Past Surgical History  Procedure Laterality Date  . Tubal ligation      Family History  Problem Relation Age of Onset  . Diabetes Paternal Uncle     Social History:  reports  that she has never smoked. She has never used smokeless tobacco. She reports that she drinks alcohol. She reports that she does not use illicit drugs.  Allergies: No Known Allergies  Medications:  Scheduled Meds: . aspirin EC  162 mg Oral QHS  . heparin  5,000 Units Subcutaneous 3 times per day  . predniSONE  60 mg Oral Q breakfast  . sodium chloride  3 mL Intravenous Q12H   Continuous Infusions: . sodium chloride 75 mL/hr at 12/26/13 2200   PRN Meds:.  Results for orders placed during the hospital encounter of 12/26/13 (from the past 48 hour(s))  URINALYSIS, ROUTINE W REFLEX MICROSCOPIC     Status: Abnormal   Collection Time    12/26/13  3:51 PM      Result Value Ref Range   Color, Urine YELLOW  YELLOW   APPearance CLEAR  CLEAR   Specific Gravity, Urine 1.009  1.005 - 1.030   pH 8.0  5.0 - 8.0   Glucose, UA NEGATIVE  NEGATIVE mg/dL   Hgb urine dipstick NEGATIVE  NEGATIVE   Bilirubin Urine NEGATIVE  NEGATIVE   Ketones, ur 15 (*) NEGATIVE mg/dL   Protein, ur NEGATIVE  NEGATIVE mg/dL   Urobilinogen, UA 1.0  0.0 - 1.0 mg/dL   Nitrite NEGATIVE  NEGATIVE   Leukocytes, UA NEGATIVE  NEGATIVE   Comment: MICROSCOPIC NOT DONE ON URINES WITH NEGATIVE PROTEIN, BLOOD, LEUKOCYTES, NITRITE, OR GLUCOSE <1000 mg/dL.  CBC WITH DIFFERENTIAL     Status: Abnormal   Collection Time    12/26/13  4:00 PM      Result Value Ref Range   WBC 8.1  4.0 - 10.5 K/uL   RBC 4.08  3.87 - 5.11 MIL/uL   Hemoglobin 12.0  12.0 - 15.0 g/dL   HCT 34.8 (*) 36.0 - 46.0 %   MCV 85.3  78.0 - 100.0 fL   MCH 29.4  26.0 - 34.0 pg   MCHC 34.5  30.0 - 36.0 g/dL   RDW 12.6  11.5 - 15.5 %   Platelets 395  150 - 400 K/uL   Neutrophils Relative % 76  43 - 77 %   Neutro Abs 6.1  1.7 - 7.7 K/uL   Lymphocytes Relative 12  12 - 46 %   Lymphs Abs 1.0  0.7 - 4.0 K/uL   Monocytes Relative 12  3 - 12 %   Monocytes Absolute 1.0  0.1 - 1.0 K/uL   Eosinophils Relative 0  0 - 5 %   Eosinophils Absolute 0.0  0.0 - 0.7 K/uL    Basophils Relative 0  0 - 1 %   Basophils Absolute 0.0  0.0 - 0.1 K/uL  COMPREHENSIVE METABOLIC PANEL     Status: Abnormal   Collection Time    12/26/13  4:00 PM      Result Value Ref Range   Sodium 137  137 - 147 mEq/L   Potassium 3.8  3.7 - 5.3 mEq/L   Chloride 96  96 - 112 mEq/L   CO2 23  19 - 32 mEq/L   Glucose, Bld 119 (*) 70 - 99 mg/dL   BUN 9  6 - 23 mg/dL   Creatinine, Ser 0.73  0.50 - 1.10 mg/dL   Calcium 9.5  8.4 - 10.5 mg/dL   Total Protein 7.3  6.0 - 8.3 g/dL   Albumin 3.2 (*) 3.5 - 5.2 g/dL   AST 20  0 - 37 U/L   ALT 11  0 - 35 U/L   Alkaline Phosphatase 92  39 - 117 U/L   Total Bilirubin 0.6  0.3 - 1.2 mg/dL   GFR calc non Af Amer 83 (*) >90 mL/min   GFR calc Af Amer >90  >90 mL/min   Comment: (NOTE)     The eGFR has been calculated using the CKD EPI equation.     This calculation has not been validated in all clinical situations.     eGFR's persistently <90 mL/min signify possible Chronic Kidney     Disease.  TROPONIN I     Status: None   Collection Time    12/26/13  4:00 PM      Result Value Ref Range   Troponin I <0.30  <0.30 ng/mL   Comment:            Due to the release kinetics of cTnI,     a negative result within the first hours     of the onset of symptoms does not rule out     myocardial infarction with certainty.     If myocardial infarction is still suspected,     repeat the test at appropriate intervals.  TSH     Status: None   Collection Time    12/26/13  4:00 PM      Result Value Ref Range   TSH 2.428  0.350 - 4.500 uIU/mL   Comment: Performed at [facility]  T4,  FREE     Status: None   Collection Time    12/26/13  4:00 PM      Result Value Ref Range   Free T4 1.18  0.80 - 1.80 ng/dL   Comment: Performed at [place]     Status: Abnormal   Collection Time    12/26/13  7:30 PM      Result Value Ref Range   CRP 14.0 (*) <0.60 mg/dL   Comment: Performed at [facility]  TROPONIN I      Status: None   Collection Time    12/26/13  8:11 PM      Result Value Ref Range   Troponin I <0.30  <0.30 ng/mL   Comment:            Due to the release kinetics of cTnI,     a negative result within the first hours     of the onset of symptoms does not rule out     myocardial infarction with certainty.     If myocardial infarction is still suspected,     repeat the test at appropriate intervals.  TROPONIN I     Status: None   Collection Time    12/27/13  4:38 AM      Result Value Ref Range   Troponin I <0.30  <0.30 ng/mL   Comment:            Due to the release kinetics of cTnI,     a negative result within the first hours     of the onset of symptoms does not rule out     myocardial infarction with certainty.     If myocardial infarction is still suspected,     repeat the test at appropriate intervals.  TROPONIN I     Status: None   Collection Time    12/27/13  6:58 AM      Result Value Ref Range   Troponin I <0.30  <0.30 ng/mL   Comment:            Due to the release kinetics of cTnI,     a negative result within the first hours     of the onset of symptoms does not rule out     myocardial infarction with certainty.     If myocardial infarction is still suspected,     repeat the test at appropriate intervals.    Dg Chest 2 View  12/26/2013   CLINICAL DATA:  Left breast mass.  Weight loss.  EXAM: CHEST  2 VIEW  COMPARISON:  None.  FINDINGS: Cardiac silhouette normal in size. Thoracic aorta mildly atherosclerotic. Hilar and mediastinal contours otherwise unremarkable. Lungs hyperinflated. Lungs clear. Bronchovascular markings normal. Pulmonary vascularity normal. No visible pleural effusions. No pneumothorax. Calcification of the central bronchial cartilages. Degenerative changes involving the thoracic spine.  IMPRESSION: Hyperinflation consistent with COPD and/or asthma. No acute cardiopulmonary disease.  No discrepancy with the original interpretation by Dr. Everlene Farrier.    Electronically Signed   By: Evangeline Dakin M.D.   On: 12/26/2013 15:05    Review of Systems  Constitutional: Positive for weight loss, malaise/fatigue and diaphoresis. Negative for fever and chills.  HENT: Negative for hearing loss and tinnitus.   Eyes: Positive for blurred vision. Negative for double vision, photophobia, pain, discharge and redness.  Respiratory: Negative for cough, shortness of breath and wheezing.   Cardiovascular: Negative for chest pain and palpitations.  Gastrointestinal: Negative  for nausea, vomiting, abdominal pain, diarrhea, constipation, blood in stool and melena.  Genitourinary: Negative for dysuria, urgency and frequency.  Neurological: Negative for dizziness, tingling, tremors, sensory change, speech change, focal weakness, seizures, loss of consciousness, weakness and headaches.  Psychiatric/Behavioral: Negative.    Blood pressure 123/67, pulse 96, temperature 98.1 F (36.7 C), temperature source Oral, resp. rate 18, height $RemoveBe'5\' 6"'[id number]$  (1.676 m), weight 139 lb 9.6 oz (63.322 kg), SpO2 97.00%. Physical Exam  Constitutional: She is oriented to person, place, and time. She appears well-developed and well-nourished. No distress.  HENT:  Head: Normocephalic and atraumatic.  Palpable right temporal artery, no erythema, minimal tenderness  Eyes: Conjunctivae are normal. Right eye exhibits no discharge. Left eye exhibits no discharge. No scleral icterus.  Cardiovascular: Normal rate, regular rhythm, normal heart sounds and intact distal pulses.  Exam reveals no gallop and no friction rub.   No murmur heard. Respiratory: Effort normal and breath sounds normal. No respiratory distress. She has no wheezes. She has no rales. She exhibits no tenderness.  GI: Soft. Bowel sounds are normal. She exhibits no distension and no mass. There is no tenderness. There is no rebound and no guarding.  Musculoskeletal: Normal range of motion. She exhibits no edema and no tenderness.   Neurological: She is oriented to person, place, and time.  Skin: Skin is warm and dry. No rash noted. She is not diaphoretic. No erythema. No pallor.  Left breast-4-5cm palpable, hard, immovable mass.  There is no nipple inversion asymmetric breasts.  There is no palpable regional adenopathy on my exam  Psychiatric: She has a normal mood and affect. Her behavior is normal. Judgment and thought content normal.    Assessment/Plan: 1. Presumed temporal arteritis- maintain NPO, pending surgeon and OR availability we may proceed with the biopsy today.  Hold next dose of heparin.  The patient is agreeable with the procedure. 2. Left breast mass-agree with diagnostic mammogram.  Further work up will likely be done in the breast clinic on more urgent outpatient basis.  Will discuss with Dr. Redmond Pulling.   Thank you for the consult.   Angeline Trick ANP-BC 12/27/2013, 10:34 AM

## 2013-12-27 NOTE — Anesthesia Procedure Notes (Signed)
Procedure Name: MAC Date/Time: 12/27/2013 2:29 PM Performed by: Jenne Campus Pre-anesthesia Checklist: Patient identified, Emergency Drugs available, Patient being monitored, Suction available and Timeout performed Patient Re-evaluated:Patient Re-evaluated prior to inductionOxygen Delivery Method: Nasal cannula Intubation Type: IV induction

## 2013-12-27 NOTE — Anesthesia Preprocedure Evaluation (Addendum)
Anesthesia Evaluation  Patient identified by MRN, date of birth, ID band  Reviewed: Allergy & Precautions, H&P , NPO status , Patient's Chart, lab work & pertinent test results  History of Anesthesia Complications Negative for: history of anesthetic complications  Airway Mallampati: III TM Distance: >3 FB Neck ROM: Full  Mouth opening: Limited Mouth Opening  Dental  (+) Teeth Intact, Dental Advisory Given   Pulmonary neg pulmonary ROS,  breath sounds clear to auscultation        Cardiovascular negative cardio ROS  Rhythm:Regular Rate:Normal     Neuro/Psych negative neurological ROS  negative psych ROS   GI/Hepatic negative GI ROS, Neg liver ROS,   Endo/Other  negative endocrine ROS  Renal/GU negative Renal ROS     Musculoskeletal   Abdominal   Peds  Hematology   Anesthesia Other Findings   Reproductive/Obstetrics negative OB ROS                           Anesthesia Physical Anesthesia Plan  ASA: I  Anesthesia Plan: MAC   Post-op Pain Management:    Induction: Intravenous  Airway Management Planned: Nasal Cannula  Additional Equipment:   Intra-op Plan:   Post-operative Plan:   Informed Consent: I have reviewed the patients History and Physical, chart, labs and discussed the procedure including the risks, benefits and alternatives for the proposed anesthesia with the patient or authorized representative who has indicated his/her understanding and acceptance.     Plan Discussed with: CRNA, Anesthesiologist and Surgeon  Anesthesia Plan Comments:         Anesthesia Quick Evaluation

## 2013-12-27 NOTE — Progress Notes (Signed)
Patient ID: Lindsey [name]  female  ZOX:[id number]    DOB: [birth year]    DOA: 12/26/2013  PCP: No primary provider on file.  Assessment/Plan: Principal Problem:   Temporal arteritis-suspected with presenting complaints of intermittent visual disturbance, patient started on steroids - ESR, CRP elevated, discussed with Dr. Doy Mince from neurology service for consult - Discussed with Dr. Redmond Pulling for temporal artery biopsy - Continue prednisone 60 mg daily, will need outpatient neurology followup   Active Problems:   Breast mass, left - Patient never had any screening mammogram or colonoscopy is done, she's never been to any physician for primary care - Discussed with surgery service for possible left breast biopsy - Patient will need a mammogram scheduled outpatient and breast ultrasound, primary care physician - Discussed in detail with the patient at the bedside, discussed with case management also     Abnormal EKG - With repolarization changes, serial troponins have been negative, patient has no chest pain or shortness of breath -Repeat EKG in a.m.  DVT Prophylaxis: SCDs  Code Status: Full CODE STATUS  Family Communication: Patient is alert and oriented, discussed in detail with patient herself   Disposition:    Subjective: Patient seen and examined, has multiple complaints      Objective: Weight change:   Intake/Output Summary (Last 24 hours) at 12/27/13 1344 Last data filed at 12/26/13 2050  Gross per 24 hour  Intake   1000 ml  Output      0 ml  Net   1000 ml   Blood pressure 129/68, pulse 92, temperature 98.3 F (36.8 C), temperature source Oral, resp. rate 20, height $RemoveBe'5\' 6"'iQrqPQyCR$  (1.676 m), weight 63.322 kg (139 lb 9.6 oz), SpO2 97.00%.  Physical Exam: General: Alert and awake, oriented x3, not in any acute distress. CVS: S1-S2 clear, no murmur rubs or gallops Chest: clear to auscultation bilaterally, no wheezing, rales or rhonchi Breast: About 5 cm mass in the left  breast medial upper quadrant Abdomen: soft nontender, nondistended, normal bowel sounds  Extremities: no cyanosis, clubbing or edema noted bilaterally Neuro: Cranial nerves II-XII intact, no focal neurological deficits  Lab Results: Basic Metabolic Panel:  Recent Labs Lab 12/26/13 1336 12/26/13 1600  NA 132* 137  K 4.1 3.8  CL 96 96  CO2 25 23  GLUCOSE 133* 119*  BUN 10 9  CREATININE 0.68 0.73  CALCIUM 9.2 9.5   Liver Function Tests:  Recent Labs Lab 12/26/13 1336 12/26/13 1600  AST 21 20  ALT 12 11  ALKPHOS 93 92  BILITOT 0.6 0.6  PROT 6.7 7.3  ALBUMIN 3.6 3.2*   No results found for this basename: LIPASE, AMYLASE,  in the last 168 hours No results found for this basename: AMMONIA,  in the last 168 hours CBC:  Recent Labs Lab 12/26/13 1343 12/26/13 1600  WBC 9.3 8.1  NEUTROABS  --  6.1  HGB 12.5 12.0  HCT 39.6 34.8*  MCV 89.5 85.3  PLT  --  395   Cardiac Enzymes:  Recent Labs Lab 12/26/13 2011 12/27/13 0438 12/27/13 0658  TROPONINI <0.30 <0.30 <0.30   BNP: No components found with this basename: POCBNP,  CBG: No results found for this basename: GLUCAP,  in the last 168 hours   Micro Results: No results found for this or any previous visit (from the past 240 hour(s)).  Studies/Results: Dg Chest 2 View  12/26/2013   CLINICAL DATA:  Left breast mass.  Weight loss.  EXAM: CHEST  2 VIEW  COMPARISON:  None.  FINDINGS: Cardiac silhouette normal in size. Thoracic aorta mildly atherosclerotic. Hilar and mediastinal contours otherwise unremarkable. Lungs hyperinflated. Lungs clear. Bronchovascular markings normal. Pulmonary vascularity normal. No visible pleural effusions. No pneumothorax. Calcification of the central bronchial cartilages. Degenerative changes involving the thoracic spine.  IMPRESSION: Hyperinflation consistent with COPD and/or asthma. No acute cardiopulmonary disease.  No discrepancy with the original interpretation by Dr. Everlene Farrier.    Electronically Signed   By: Evangeline Dakin M.D.   On: 12/26/2013 15:05    Medications: Scheduled Meds: . aspirin EC  162 mg Oral QHS  .  ceFAZolin (ANCEF) IV  2 g Intravenous On Call  . heparin  5,000 Units Subcutaneous 3 times per day  . predniSONE  60 mg Oral Q breakfast  . sodium chloride  3 mL Intravenous Q12H      LOS: 1 day   RAI,RIPUDEEP M.D. [facility] 12/27/2013, 1:44 PM Pager: [phone]  If 7PM-7AM, please contact night-coverage [email or web address] Password [email or web address]

## 2013-12-27 NOTE — Progress Notes (Signed)
INITIAL NUTRITION ASSESSMENT  DOCUMENTATION CODES Per approved criteria  -Not Applicable   INTERVENTION: Supplement diet as appropriate once advanced.   NUTRITION DIAGNOSIS: Unintentional weight loss related to poor appetite as evidenced by per pt report.   Goal: Pt to meet >/= 90% of their estimated nutrition needs   Monitor:  Diet advancement, PO intake, weight trend, labs  Reason for Assessment: Pt identified as at nutrition risk on the Malnutrition Screen Tool  73 y.o. female  Admitting Dx: Temporal arteritis  ASSESSMENT: Pt admitted with fatigue and weight loss. Pt with temporal arteritis and left breast lump. Pt starting steroids. Pt NPO for pending surgical consult. Mammogram pending.  Pt is not followed by a PCP.  Per pt she began to make diet changes back last summer and began to lose some weight (2-3 lb per month). However, in January 2015 pt began to lose her appetite and lose weight more rapidly. She is not sure how much she weighed at that time.  Neurology in to see pt and then pt to surgery. Unable to complete nutrition-focused physical exam at this time.   Height: Ht Readings from Last 1 Encounters:  12/26/13 5\' 6"  (1.676 m)    Weight: Wt Readings from Last 1 Encounters:  12/27/13 139 lb 9.6 oz (63.322 kg)    Ideal Body Weight: 59 kg   % Ideal Body Weight: 107%  Wt Readings from Last 10 Encounters:  12/27/13 139 lb 9.6 oz (63.322 kg)  12/26/13 138 lb 9.6 oz (62.869 kg)    Usual Body Weight: unknown  % Usual Body Weight: -  BMI:  Body mass index is 22.54 kg/(m^2).  Estimated Nutritional Needs: Kcal: 1450-1600 Protein: 70-80 grams Fluid: > 1.5 L/day  Skin: no issues noted  Diet Order: NPO  EDUCATION NEEDS: -No education needs identified at this time   Intake/Output Summary (Last 24 hours) at 12/27/13 1209 Last data filed at 12/26/13 2050  Gross per 24 hour  Intake   1000 ml  Output      0 ml  Net   1000 ml    Last BM: PTA    Labs:   Recent Labs Lab 12/26/13 1336 12/26/13 1600  NA 132* 137  K 4.1 3.8  CL 96 96  CO2 25 23  BUN 10 9  CREATININE 0.68 0.73  CALCIUM 9.2 9.5  GLUCOSE 133* 119*    CBG (last 3)  No results found for this basename: GLUCAP,  in the last 72 hours  Scheduled Meds: . aspirin EC  162 mg Oral QHS  . heparin  5,000 Units Subcutaneous 3 times per day  . predniSONE  60 mg Oral Q breakfast  . sodium chloride  3 mL Intravenous Q12H    Continuous Infusions: . sodium chloride 75 mL/hr at 12/26/13 2200    History reviewed. No pertinent past medical history.  Past Surgical History  Procedure Laterality Date  . Tubal ligation      [place], [place], [place] Pager [phone] After Hours Pager

## 2013-12-27 NOTE — Brief Op Note (Signed)
12/26/2013 - 12/27/2013  4:13 PM  PATIENT:  [name]  73 y.o. female  PRE-OPERATIVE DIAGNOSIS:  Temporal Arteritis  POST-OPERATIVE DIAGNOSIS:  Temporal Arteritis  PROCEDURE:  Procedure(s): BIOPSY TEMPORAL ARTERY (Right)  SURGEON:  Surgeon(s) and Role:    * Gayland Curry, MD - Primary  PHYSICIAN ASSISTANT: none  ASSISTANTS: none   ANESTHESIA:   local and MAC  EBL:  Total I/O In: 1000 [I.V.:1000] Out: -   BLOOD ADMINISTERED:none  DRAINS: none   LOCAL MEDICATIONS USED:  MARCAINE    and LIDOCAINE   SPECIMEN:  Source of Specimen:  right temporal artery  DISPOSITION OF SPECIMEN:  PATHOLOGY  COUNTS:  YES  TOURNIQUET:  * No tourniquets in log *  DICTATION: .Other Dictation: Dictation Number [id number]  PLAN OF CARE: pacu then return to floor  PATIENT DISPOSITION:  PACU - hemodynamically stable.   Delay start of Pharmacological VTE agent (>24hrs) due to surgical blood loss or risk of bleeding: no  Leighton Ruff. Redmond Pulling, MD, FACS General, Bariatric, & Minimally Invasive Surgery [facility] [place], [place]

## 2013-12-27 NOTE — Anesthesia Postprocedure Evaluation (Signed)
Anesthesia Post Note  Patient: Lindsey [name]  Procedure(s) Performed: Procedure(s) (LRB): BIOPSY TEMPORAL ARTERY (Right)  Anesthesia type: MAC  Patient location: PACU  Post pain: Pain level controlled and Adequate analgesia  Post assessment: Post-op Vital signs reviewed, Patient's Cardiovascular Status Stable and Respiratory Function Stable  Last Vitals:  Filed Vitals:   12/27/13 1610  BP: 124/69  Pulse: 86  Temp:   Resp: 15    Post vital signs: Reviewed and stable  Level of consciousness: awake, alert  and oriented  Complications: No apparent anesthesia complications

## 2013-12-27 NOTE — Care Management (Signed)
1406 12-27-13 CM provided pt with the Health Connect Number to set up PCP in her local area. No further needs from CM a this time. Lindsey Roys, RN,BSN [phone]

## 2013-12-27 NOTE — Consult Note (Signed)
I saw the patient, participated in the history, exam and medical decision making, and concur with the physician assistant's note above. No active cardiac ROS. Denies Amaurosis fugax and TIA.   Alert, nad Bounding Rt temporal artery No LAD Breast exam - supervised by nurse -  Rt breast wnl - no nipple/skin retraction, no palp masses. No axill LAD Left breast abnml - obvious well circumscribed mass in upper breast, firm, immobile. Mass from 10 -1 o'clock about 4-5cm; larger than golf ball. No nipple/skin changes. No LAD.  No supraclavicular LAD   night sweats, Weight loss Jaw pain Weakness Cold intolerance L breast mass  Symptoms suggestive of temporal arteritis - discussed bx with pt and daughter. Discussed risk/benefits/complications - bleeding, infection, injury to surrounding structure, scarring, non-dx biopsy, anesthesia complications. Discussed typical post op course. To OR today for temporal artery biopsy  L breast mass - no prior mammogram. Informed pt and daughter that is very suspicious for a cancer til proven o/w. Needs mammogram and u/s and ultimately bx which can be done as outpt. Pt states she will followup with imaging and workup as requested.  Leighton Ruff. Redmond Pulling, MD, FACS General, Bariatric, & Minimally Invasive Surgery [facility], [place]

## 2013-12-28 MED ORDER — PREDNISONE 20 MG PO TABS: 60.0000 mg | ORAL_TABLET | Freq: Every day | ORAL | Status: DC

## 2013-12-28 NOTE — Op Note (Signed)
NAMEMarland Kitchen  PAMI, WOOL NO.:  [id number]  MEDICAL RECORD NO.:  [id number]  LOCATION:  3W10C                        FACILITY:  [facility]  PHYSICIAN:  Leighton Ruff. Redmond Pulling, MD, FACSDATE OF BIRTH:  10-03-1941  DATE OF PROCEDURE:  12/27/2013 DATE OF DISCHARGE:                              OPERATIVE REPORT   PREOPERATIVE DIAGNOSES:  Intermittent visual disturbance, elevated ESR and CRP, jaw claudication, concerning for temporal arteritis.  POSTOPERATIVE DIAGNOSES:  Intermittent visual disturbance, elevated ESR and CRP, jaw claudication, concerning for temporal arteritis.  PROCEDURE:  Right temporal artery biopsy.  SURGEON:  Leighton Ruff. Redmond Pulling, MD, FACS  ASSISTANT SURGEON:  None.  ANESTHESIA:  Local plus monitored anesthesia care.  ESTIMATED BLOOD LOSS:  Minimal.  SPECIMEN:  Right temporal artery.  INDICATIONS FOR PROCEDURE:  The patient is complaining of pain along her temples bilaterally as well as in her jaws and neck, as well as some blurry vision.  They generally resolved spontaneously.  She is found to have an elevated CRP and ESR concerning for temporal arteritis.  We were consulted for temporal artery biopsy.  I have discussed the risks and benefits of the procedure with the patient and her daughter including, but not limited to, bleeding, infection, injury to surrounding structures, scarring, hematoma formation, seroma formation, nondiagnostic biopsy, need for additional procedures, anesthesia complications, nerve injury as well as typical postop recovery course.  DESCRIPTION OF PROCEDURE:  After obtaining informed consent, the patient was taken to the operating room 1 at [facility], placed supine on the operating table.  Monitored anesthesia care was established.  A surgical time-out was performed.  Her right forehead was prepped and draped in the usual standard surgical fashion with Betadine. A surgical time-out was performed.  She received IV  antibiotics prior to skin incision.  She had a palpable bounding right temporal artery. I outlined a planned oblique incision posterior to her temporal artery involving her hairline. The skin incision was anesthetized with local.  A oblique 3 cm incision was made sharply with a 15 blade.  The deep dermis was divided with electrocautery.  The patient did cough somewhat during the procedure and her monitored anesthesia, her MAC was lightened.  I was able to isolate what appeared to be the frontal branch of the superficial temporal artery.  A 2 cm segment was isolated.  Proximally, it was ligated with a 2-0 silk suture as well as a small Hemoclip.  Distally, also ligated in the similar fashion with a 2-0 silk tie and a Hemoclip.  The isolated segment was ligated and excised.  It was passed off the field and sent to Pathology for analysis.  The wound was irrigated.  The deep dermis was reapproximated with inverted 3-0 Vicryl, and the skin was reapproximated with a running 4-0 Monocryl in subcuticular fashion followed by the application of Dermabond.  The patient tolerated the procedure well.  There were no immediate complications.     Leighton Ruff. Redmond Pulling, MD, FACS     EMW/MEDQ  D:  12/27/2013  T:  12/28/2013  Job:  [id number]  cc:   Alexis Goodell, MD Estill Cotta, MD

## 2013-12-28 NOTE — Progress Notes (Signed)
1 Day Post-Op  Subjective: No pain at surgical site.  Wants to shower.    Objective: Vital signs in last 24 hours: Temp:  [98.3 F (36.8 C)-99 F (37.2 C)] 98.5 F (36.9 C) (02/18 0704) Pulse Rate:  [77-96] 77 (02/18 0704) Resp:  [15-20] 18 (02/18 0704) BP: (110-131)/(62-72) 123/62 mmHg (02/18 0704) SpO2:  [94 %-100 %] 95 % (02/18 0704) Last BM Date: 12/26/13  Intake/Output from previous day: 02/17 0701 - 02/18 0700 In: 1100 [I.V.:1100] Out: -  Intake/Output this shift:   PE Wound: right temporal region, incision is c/d/i, dermabond in place.  Lab Results:   Recent Labs  12/26/13 1343 12/26/13 1600  WBC 9.3 8.1  HGB 12.5 12.0  HCT 39.6 34.8*  PLT  --  395   BMET  Recent Labs  12/26/13 1336 12/26/13 1600  NA 132* 137  K 4.1 3.8  CL 96 96  CO2 25 23  GLUCOSE 133* 119*  BUN 10 9  CREATININE 0.68 0.73  CALCIUM 9.2 9.5   PT/INR No results found for this basename: LABPROT, INR,  in the last 72 hours ABG No results found for this basename: PHART, PCO2, PO2, HCO3,  in the last 72 hours  Studies/Results: Dg Chest 2 View  12/26/2013   CLINICAL DATA:  Left breast mass.  Weight loss.  EXAM: CHEST  2 VIEW  COMPARISON:  None.  FINDINGS: Cardiac silhouette normal in size. Thoracic aorta mildly atherosclerotic. Hilar and mediastinal contours otherwise unremarkable. Lungs hyperinflated. Lungs clear. Bronchovascular markings normal. Pulmonary vascularity normal. No visible pleural effusions. No pneumothorax. Calcification of the central bronchial cartilages. Degenerative changes involving the thoracic spine.  IMPRESSION: Hyperinflation consistent with COPD and/or asthma. No acute cardiopulmonary disease.  No discrepancy with the original interpretation by Dr. Everlene Farrier.   Electronically Signed   By: Evangeline Dakin M.D.   On: 12/26/2013 15:05    Anti-infectives: Anti-infectives   Start     Dose/Rate Route Frequency Ordered Stop   12/27/13 1245  [MAR Hold]  ceFAZolin (ANCEF)  IVPB 2 g/50 mL premix     (On MAR Hold since 12/27/13 1352)   2 g 100 mL/hr over 30 Minutes Intravenous On call 12/27/13 1231 12/27/13 1422      Assessment/Plan: Presumed temporal arteritis -s/p biopsy.  POD#1.   -Biopsy pending.   Left breast mass -needs a diagnostic mammogram with an ultrasound and a biopsy of the right breast on Friday which was arranged by primary team. An appointment has been made on behalf of the patient in our clinic on 01/06/14 with Dr. Excell Seltzer.      LOS: 2 days    Alias Villagran ANP-BC 12/28/2013 11:13 AM

## 2013-12-28 NOTE — Care Management Note (Signed)
    Page 1 of 1   12/28/2013     10:57:40 AM   CARE MANAGEMENT NOTE 12/28/2013  Patient:  Lindsey [name], Lindsey [name]   Account Number:  [id number]  Date Initiated:  12/28/2013  Documentation initiated by:  GRAVES-BIGELOW,Amarri Michaelson  Subjective/Objective Assessment:   Temporal arteritis-suspected with presenting complaints of intermittent visual disturbance, patient started on steroids.     Action/Plan:   CM provided pt with an appointment to [facility]. CM will fax information to [facility]. CM did call Arnaldo Natal to make a PCP appointment.   Anticipated DC Date:  12/28/2013   Anticipated DC Plan:  [place]  CM consult      Choice offered to / List presented to:             Status of service:  Completed, signed off Medicare Important Message given?   (If response is "NO", the following Medicare IM given date fields will be blank) Date Medicare IM given:   Date Additional Medicare IM given:    Discharge Disposition:  HOME/SELF CARE  Per UR Regulation:  Reviewed for med. necessity/level of care/duration of stay  If discussed at [place] of Stay Meetings, dates discussed:    Comments:

## 2013-12-28 NOTE — Discharge Instructions (Signed)
Breast Biopsy °A breast biopsy is a procedure where a sample of breast tissue is removed from your breast. The tissue is examined under a microscope to see if cancerous cells are present. A breast biopsy is done when there is: °· Any undiagnosed breast mass (tumor). °· Nipple abnormalities, dimpling, crusting, or ulcerations. °· Abnormal discharge from the nipple, especially blood. °· Redness, swelling, and pain of the breast. °· Calcium deposits (calcifications) or abnormalities seen on a mammogram, ultrasound result, or results of magnetic resonance imaging (MRI). °· Suspicious changes in the breast seen on your mammogram. °If the tumor is found to be cancerous (malignant), a breast biopsy can help to determine what the best treatment is for you. There are many different types of breast biopsies. Talk to your caregiver about your options and which type is best for you. °LET YOUR CAREGIVER KNOW ABOUT: °· Allergies to food or medicine. °· Medicines taken, including vitamins, herbs, eyedrops, over-the-counter medicines, and creams. °· Use of steroids (by mouth or creams). °· Previous problems with anesthetics or numbing medicines. °· History of bleeding problems or blood clots. °· Previous surgery. °· Other health problems, including diabetes and kidney problems. °· Any recent colds or infections. °· Possibility of pregnancy, if this applies. °RISKS AND COMPLICATIONS  °· Bleeding. °· Infection. °· Allergy to medicines. °· Bruising and swelling of the breast. °· Alteration in the shape of the breast. °· Not finding the lump or abnormality. °· Needing more surgery. °BEFORE THE PROCEDURE °· Arrange for someone to drive you home after the procedure. °· Do not smoke for 2 weeks before the procedure. Stop smoking, if you smoke. °· Do not drink alcohol for 24 hours before procedure. °· Wear a good support bra to the procedure. °PROCEDURE  °You may be given a medicine to numb the breast area (local anesthesia) or a medicine  to make you sleep (general anesthesia) during the procedure. The following are the different types of biopsies that can be performed.  °· Fine-needle aspiration A thin needle is attached to a syringe and inserted into the breast lump. Fluid and cells are removed and then looked at under a microscope. If the breast lump cannot be felt, an ultrasound may be used to help locate the lump and place the needle in the correct area.   °· Core needle biopsy A wide, hollow needle (core needle) is inserted into the breast lump 3 6 times to get tissue samples or cores. The samples are removed. The needle is usually placed in the correct area by using an ultrasound or X-ray.   °· Stereotactic biopsy X-ray equipment and a computer are used to analyze X-ray pictures of the breast lump. The computer then finds exactly where the core needle needs to be inserted. Tissue samples are removed.   °· Vacuum-assisted biopsy A small incision (less than ¼ inch) is made in your breast. A biopsy device that includes a hollow needle and vacuum is passed through the incision and into the breast tissue. The vacuum gently draws abnormal breast tissue into the needle to remove it. This type of biopsy removes a larger tissue sample than a regular core needle biopsy. No stitches are needed, and there is usually little scarring. °· Ultrasound-guided core needle biopsy A high frequency ultrasound helps guide the core needle to the area of the mass or abnormality. An incision is made to insert the needle. Tissue samples are removed. °· Open biopsy A larger incision is made in the breast. Your caregiver will attempt   the core needle to the area of the mass or abnormality. An incision is made to insert the needle. Tissue samples are removed.  · Open biopsy A larger incision is made in the breast. Your caregiver will attempt to remove the whole breast lump or as much as possible.  AFTER THE PROCEDURE  · You will be taken to the recovery area. If you are doing well and have no problems, you will be allowed to go home.  · You may notice bruising on your breast. This is normal.  · Your caregiver may apply a pressure dressing on your breast for 24 48 hours. A  pressure dressing is a bandage that is wrapped tightly around the chest to stop fluid from collecting underneath tissues.  Document Released: 10/27/2005 Document Revised: 02/21/2013 Document Reviewed: 11/27/2011  ExitCare® Patient Information ©2014 [employer].

## 2013-12-28 NOTE — Discharge Summary (Signed)
Physician Discharge Summary  Lindsey [name] CRS:[id number] DOB: Sep 27, 1941 DOA: 12/26/2013  PCP: No primary provider on file.  Admit date: 12/26/2013 Discharge date: 12/28/2013  Time spent: 40 minutes  Recommendations for Outpatient Follow-up:  1. Followup with primary care physician within one week.  Discharge Diagnoses:  Principal Problem:   Temporal arteritis Active Problems:   Breast mass, left   Abnormal resting ECG findings   Discharge Condition: Stable  Diet recommendation: Heart healthy  Filed Weights   12/26/13 1504 12/26/13 2115 12/27/13 0542  Weight: 62.596 kg (138 lb) 63.322 kg (139 lb 9.6 oz) 63.322 kg (139 lb 9.6 oz)    History of present illness:  Lindsey [name] is a 73 y.o. female who presented to UC earlier today with c/o night sweats, loss of appetite, weakness, cold intolerance. Symptoms onset mid Jan. Also noticed lump in L breast, soreness in temples and prominence of vasculature in temples. Never had a mammogram nor screening colonoscopy, last check up with doctor several years ago.  Hospital Course:   1. Possible temporal arteritis: Patient initially was presented to urgent care Center for having different symptoms including general weakness and fatigability, she has drenching night sweats, loss of appetite and lost 23 pounds over the past 6 months. She also mentioned that she has intermittent visual disturbances, temporal tenderness. At the time of admission to the hospital ESR and CRP were elevated, neurology service consulted and this is said to be likely temporal arteritis. General surgery consulted for temporal artery biopsy patient was started immediately on prednisone 60 mg. Temporal artery biopsy was done yesterday, biopsy pending. Patient wants to be discharged home today to followup with primary care physician.  2. Left breast mass: Patient said she has never had any screening mammogram done before, she had left breast mass measures about 6x6 cm  above the nipple, there is no fungating masses, no discharge no redness not attached to the skin or the muscle. Patient needs mammogram and breast ultrasound as outpatient. This is scheduled to be done as outpatient 2 days from now Friday, 01/01/2014 results will be sent to primary care physician as well as general surgeon Dr. Andrey Campanile.  Procedures:  Right temporal artery biopsy done by Dr. Andrey Campanile on 12/27/2013.  Consultations:  General surgery  Discharge Exam: Filed Vitals:   12/28/13 0704  BP: 123/62  Pulse: 77  Temp: 98.5 F (36.9 C)  Resp: 18   General: Alert and awake, oriented x3, not in any acute distress. HEENT: anicteric sclera, pupils reactive to light and accommodation, EOMI CVS: S1-S2 clear, no murmur rubs or gallops Chest: clear to auscultation bilaterally, no wheezing, rales or rhonchi, left breast mass measures about 6 x 6 cm 12 o'clock. Abdomen: soft nontender, nondistended, normal bowel sounds, no organomegaly Extremities: no cyanosis, clubbing or edema noted bilaterally Neuro: Cranial nerves II-XII intact, no focal neurological deficits  Discharge Instructions     Medication List         aspirin EC 81 MG tablet  Take 162 mg by mouth at bedtime.     multivitamin with minerals tablet  Take 1 tablet by mouth daily.     predniSONE 20 MG tablet  Commonly known as:  DELTASONE  Take 3 tablets (60 mg total) by mouth daily with breakfast.       No Known Allergies Follow-up Information   Follow up with [facility] On 12/30/2013. (@ 10:15 am please arrive at 10:00 am with insurance information and any prior films if you  have had a mammogram. )    Specialty:  Diagnostic Radiology   Contact information:   [address] [place] [address] [phone]       Follow up with [facility]. Call on 12/28/2013.   Contact information:   [place] [id number]        The results of significant  diagnostics from this hospitalization (including imaging, microbiology, ancillary and laboratory) are listed below for reference.    Significant Diagnostic Studies: Dg Chest 2 View  12/26/2013   CLINICAL DATA:  Left breast mass.  Weight loss.  EXAM: CHEST  2 VIEW  COMPARISON:  None.  FINDINGS: Cardiac silhouette normal in size. Thoracic aorta mildly atherosclerotic. Hilar and mediastinal contours otherwise unremarkable. Lungs hyperinflated. Lungs clear. Bronchovascular markings normal. Pulmonary vascularity normal. No visible pleural effusions. No pneumothorax. Calcification of the central bronchial cartilages. Degenerative changes involving the thoracic spine.  IMPRESSION: Hyperinflation consistent with COPD and/or asthma. No acute cardiopulmonary disease.  No discrepancy with the original interpretation by Dr. Everlene Farrier.   Electronically Signed   By: Evangeline Dakin M.D.   On: 12/26/2013 15:05    Microbiology: No results found for this or any previous visit (from the past 240 hour(s)).   Labs: Basic Metabolic Panel:  Sedimentation rate is 80 mm/hour CRP is 14.0  Recent Labs Lab 12/26/13 1336 12/26/13 1600  NA 132* 137  K 4.1 3.8  CL 96 96  CO2 25 23  GLUCOSE 133* 119*  BUN 10 9  CREATININE 0.68 0.73  CALCIUM 9.2 9.5   Liver Function Tests:  Recent Labs Lab 12/26/13 1336 12/26/13 1600  AST 21 20  ALT 12 11  ALKPHOS 93 92  BILITOT 0.6 0.6  PROT 6.7 7.3  ALBUMIN 3.6 3.2*   No results found for this basename: LIPASE, AMYLASE,  in the last 168 hours No results found for this basename: AMMONIA,  in the last 168 hours CBC:  Recent Labs Lab 12/26/13 1343 12/26/13 1600  WBC 9.3 8.1  NEUTROABS  --  6.1  HGB 12.5 12.0  HCT 39.6 34.8*  MCV 89.5 85.3  PLT  --  395   Cardiac Enzymes:  Recent Labs Lab 12/26/13 1600 12/26/13 2011 12/27/13 0438 12/27/13 0658  TROPONINI <0.30 <0.30 <0.30 <0.30   BNP: BNP (last 3 results) No results found for this basename: PROBNP,  in  the last 8760 hours CBG: No results found for this basename: GLUCAP,  in the last 168 hours     Signed:  Herlinda Heady A  [facility] 12/28/2013, 11:03 AM

## 2013-12-28 NOTE — Progress Notes (Signed)
Discussed with NP  Martavion Couper M. Keliyah Spillman, MD, FACS General, Bariatric, & Minimally Invasive Surgery [facility]  

## 2013-12-28 NOTE — Transfer of Care (Signed)
Immediate Anesthesia Transfer of Care Note  Patient: Lindsey [name]  Procedure(s) Performed: Procedure(s): BIOPSY TEMPORAL ARTERY (Right)  Patient Location: PACU  Anesthesia Type:MAC  Level of Consciousness: awake, alert , oriented and patient cooperative  Airway & Oxygen Therapy: Patient Spontanous Breathing and Patient connected to nasal cannula oxygen  Post-op Assessment: Report given to PACU RN and Post -op Vital signs reviewed and stable  Post vital signs: Reviewed  Complications: No apparent anesthesia complications

## 2013-12-30 ENCOUNTER — Encounter (HOSPITAL_COMMUNITY): Payer: Self-pay | Admitting: General Surgery

## 2014-01-02 ENCOUNTER — Telehealth (INDEPENDENT_AMBULATORY_CARE_PROVIDER_SITE_OTHER): Payer: Self-pay | Admitting: General Surgery

## 2014-01-02 NOTE — Telephone Encounter (Signed)
Called pt to let her know her temporal artery bx results were positive for temporal arteritis. Pt said she had f/u established for [place]. ( i went ahead and forwarded results to provider she is scheduled to see). Pt also voiced that she was going back to [facility] tomorrow for breast bx which I encouraged her to do. Pt voiced understanding.

## 2014-01-03 ENCOUNTER — Other Ambulatory Visit: Payer: Self-pay | Admitting: Radiology

## 2014-01-03 DIAGNOSIS — C50919 Malignant neoplasm of unspecified site of unspecified female breast: Secondary | ICD-10-CM

## 2014-01-03 HISTORY — PX: BREAST SURGERY: SHX581

## 2014-01-03 HISTORY — DX: Malignant neoplasm of unspecified site of unspecified female breast: C50.919

## 2014-01-06 ENCOUNTER — Encounter (INDEPENDENT_AMBULATORY_CARE_PROVIDER_SITE_OTHER): Payer: Medicare HMO | Admitting: General Surgery

## 2014-01-16 ENCOUNTER — Encounter: Payer: Self-pay | Admitting: Physician Assistant

## 2014-01-16 ENCOUNTER — Ambulatory Visit (INDEPENDENT_AMBULATORY_CARE_PROVIDER_SITE_OTHER): Payer: Medicare HMO | Admitting: Physician Assistant

## 2014-01-16 VITALS — BP 122/70 | HR 100 | Temp 98.1°F | Resp 16 | Wt 140.0 lb

## 2014-01-16 DIAGNOSIS — D242 Benign neoplasm of left breast: Secondary | ICD-10-CM

## 2014-01-16 DIAGNOSIS — D249 Benign neoplasm of unspecified breast: Secondary | ICD-10-CM

## 2014-01-16 DIAGNOSIS — Z0000 Encounter for general adult medical examination without abnormal findings: Secondary | ICD-10-CM

## 2014-01-16 DIAGNOSIS — R61 Generalized hyperhidrosis: Secondary | ICD-10-CM

## 2014-01-16 DIAGNOSIS — Z1211 Encounter for screening for malignant neoplasm of colon: Secondary | ICD-10-CM

## 2014-01-16 DIAGNOSIS — M316 Other giant cell arteritis: Secondary | ICD-10-CM

## 2014-01-16 MED ORDER — PREDNISONE 20 MG PO TABS: ORAL_TABLET | ORAL | Status: DC

## 2014-01-16 MED ORDER — PREDNISONE 20 MG PO TABS: 60.0000 mg | ORAL_TABLET | Freq: Every day | ORAL | Status: DC

## 2014-01-16 NOTE — Progress Notes (Signed)
Pre visit review using our clinic review tool, if applicable. No additional management support is needed unless otherwise documented below in the visit note. 

## 2014-01-16 NOTE — Patient Instructions (Addendum)
It was great to meet you today Lindsey [name]!   We have ordered no labs for you today.   I have sent a refill prescription of Prednisone to your preferred pharmacy. Please take as directed.   Take 2 tablets (40 mg total) daily for the next two weeks, then take one tablet ( 20 mg total) for the next two weeks. Then continue with 20 mg daily from then. You can make the decrease in medication as long as you are not experiencing a change in vision or increase in headaches or head sensitivity.   I would like you to return to the office in one month for further evaluation of your prednisone usage.   Please be certain to keep your future appointments as scheduled.   Temporal Arteritis Arteries are blood vessels that carry blood from the heart to all parts of the body. Temporal arteritis is a swelling (inflammation) of certain large arteries. This usually affects arteries in the head and neck area, including arteries in the area on the side of the head, between the ears and eyes (temples). The condition can be very painful. It also can cause serious problems, even blindness. Early diagnosis and treatment is very important. CAUSES  Temporal arteritis results from the body reacting to injury or infection (inflammation). This may occur when the body's immune system (which fights germs and disease) makes a mistake. It attacks its own arteries. No one knows why this happens. However, certain things (risk factors) make it more likely that a person will develop temporal arteritis. They include:  Age. Most people with temporal arteritis are older than 50. The average age is 12.  Sex. Three times more women than men develop the condition.  Race and ethnic background. Caucasians are more likely to have temporal arteritis than other races. So are people whose families came from [place], [place] or [place]  Having polymyalgia rheumatica (PMR). This condition causes stiffness and  pain in the joints of the neck, shoulders and hips. About 15% of people with PMR also have temporal arteritis. SYMPTOMS  Not everyone with temporal arteritis has the same symptoms. Some people have just one symptom. Others may have several. The most common symptom is a new headache, often in the temple region. Symptoms may show up in other parts of the body too.   Symptoms affecting the head may include:  Temporal arteries that feel hard or swollen. It may hurt when the temples are touched.  Pain when combing your hair, or when laying your head on a pillow.  Pain in the jaw when chewing.  Pain in the throat or tongue.  Visual problems, including sudden loss of vision in one eye, or seeing double.  Symptoms in other parts of the body may include:  Fever.  Fatigue.  A dry cough.  Pain in the hips and shoulders.  Pain in the arms during exercise.  Depression.  Weight loss. DIAGNOSIS  Symptoms of temporal arteritis are similar to symptoms for other conditions. This can make it hard to tell if you have the condition. To be sure, your caregiver will ask about your symptoms and do a physical exam. Certain tests may be necessary, such as:   An exam of your temples. Often, the temporal arteries will be swollen and hard. This can be felt.  A complete blood count. This test shows how many red blood cells are in your blood. Most people with temporal arteritis do not have enough red blood cells (anemic).  Erythrocyte sedimentation (also called sed rate test). It measures inflammation in the body. Almost everyone with temporal arteritis has a high sed rate.  C reactive protein test. This also shows if there is inflammation.  Biopsy a temporal artery. This means the caregiver will take out a small piece of an artery. Then, it is checked under a microscope for inflammation. More than one biopsy may be needed. That is because inflammation can be in one part of an artery and not in others.  Your caregiver may need to check more than one spot. TREATMENT  Starting treatment right away is very important. Often, you will need to see a specialist in immunologic diseases (rheumatologist). Goals of treatment include protecting your eyesight. Once vision is gone, it might not come back. The normal treatment is medication. It usually works well and quickly. Most people start getting better in a few days. Medication options include:  Corticosteroids. These are powerful drugs that fight inflammation. These drugs are most often used to treat temporal arteritis.  Usually, a high dose is taken at first. After symptoms improve, a smaller dose is used. The goal is to take the smallest dose possible and still control your symptoms. That is because using corticosteroids for a long time can cause problems. They can make muscles and bones weak. They can cause blood pressure to go up, and cause diabetes. Also, people often gain weight when they take corticosteroids. Corticosteroids may need to be taken for one or two years.  Several newer drugs are being tested to treat temporal arteritis. Researchers are testing to see if new drugs will work as well as corticosteroids, but cause fewer problems than them. Testing of these drugs is not yet complete.  Some specialists recommend low dose aspirin to prevent blood clots. HOME CARE INSTRUCTIONS   Take any corticosteroids that your caregiver prescribes. Follow the directions carefully.  Take any vitamins or supplements that your caregiver suggests. This may include vitamin D and calcium. They help keep your bones from becoming weak.  Keep all appointments for checkups. Your caregiver will watch for any problems from the medication. Checkups may include:  Periodic blood tests.  Bone density testing. This checks how strong or weak your bones are.  Blood pressure checks. If your blood pressure rises, you may need to take a drug to control it while you are  taking corticosteroids.  Blood sugar checks. This is to be sure you are not developing diabetes. If you have diabetes, corticosteroid medications may make it worse and require increased treatment.  Exercise. First, talk with your caregiver about what would be OK for you to do. Aerobic exercise (which increases your heart rate) is usually suggested. It does not have to require a lot of energy. Walking is aerobic exercise. This type of exercise is good because it helps prevent bone loss. It also helps control your blood pressure.  Follow a healthy diet. The goal is to prevent bone damage and diabetes. Include good sources of protein in your diet. Also, include fruits, vegetables and whole grains. Your caregiver can refer you to an expert on healthy eating (dietitian) for more advice. SEEK MEDICAL CARE IF:   The symptoms that lead to your diagnosis return.  You develop worsening fever, fatigue, headache, weight loss, or pain in your jaw.  You develop signs of infection. Infections can be worse if you are on corticosteroid medication. SEEK IMMEDIATE MEDICAL CARE IF:   Your eyesight changes.  Pain does not go away, even  after taking pain medicine.  You feel pain in your chest.  Breathing is difficult.  One side of your face or body suddenly becomes weak or numb.  You develop a fever of more than 102 F (38.9 C). Document Released: 08/24/2009 Document Revised: 01/19/2012 Document Reviewed: 08/24/2009 [facility] Patient Information 2014 [place], [place].

## 2014-01-16 NOTE — Progress Notes (Signed)
Subjective:    Patient ID: Lindsey [name], female    DOB: 07-08-1941, 73 y.o.   MRN: [id number]  HPI Comments: Patient is a 73 year old female who presents to the office to establish care. Patient is accompanied by her daughter, Jacqlyn Larsen. Reports she has not been seen by a PCP in a number of years. Reports recently diagnosed with temporal arteritis, 12/26/13, has been on Prednisone 60 mg daily since day of diagnosis. Reports approximately one month prior to and up to time of diagnosis she had been experiencing night sweats, HA and temporal head pain with temporal arteries swelling bilateral. States since taking the steroids her symptoms have resolved other than the night sweats. Night sweats started in mid January and have continued since. States has to wake up during night and change her pajamas and bedding. Reports she was also diagnosed with a papilloma of her left breast which she is having removed this month, follows with Dr. Excell Seltzer. Has not had colonoscopy. Patient expresses mild anxiety regarding health issues. Reports she was "ugly" while in hospital because she has always been independent and healthy and does not like everyone telling her right now what she needs to be doing.  Denies chest pain/palpitations, SOB, extremity swelling, cough, visual change/disturbance, lightheaded, dizziness, numbness or weakness.  Flu vaccine: 11/14 Tetanus: 2008 Pneumovax: no Mammogram: 2/15 Colonoscopy: never    Review of Systems  Constitutional: Positive for appetite change (decreased). Negative for fever and chills.       Night sweats  Eyes: Negative for pain and visual disturbance.  Respiratory: Negative for cough, shortness of breath and wheezing.   Cardiovascular: Negative for chest pain and palpitations.  Gastrointestinal: Negative for nausea, vomiting and abdominal pain.  Genitourinary: Negative.   Musculoskeletal: Negative for arthralgias and myalgias.  Neurological: Positive for headaches.  Negative for dizziness, weakness, light-headedness and numbness.  History reviewed. No pertinent past medical history. Family History  Problem Relation Age of Onset  . Diabetes Paternal Uncle    History   Social History  . Marital Status: Widowed    Spouse Name: N/A    Number of Children: N/A  . Years of Education: N/A   Social History Main Topics  . Smoking status: Never Smoker   . Smokeless tobacco: Never Used  . Alcohol Use: No     Comment: rare occaisons glass of wine  . Drug Use: No  . Sexual Activity: No   Other Topics Concern  . None   Social History Narrative  . None   Past Surgical History  Procedure Laterality Date  . Tubal ligation    . Artery biopsy Right 12/27/2013    Procedure: BIOPSY TEMPORAL ARTERY;  Surgeon: Gayland Curry, MD;  Location: [facility];  Service: General;  Laterality: Right;  . Breast surgery      breast biopsy   No Known Allergies Current Outpatient Prescriptions on File Prior to Visit  Medication Sig Dispense Refill  . aspirin EC 81 MG tablet Take 162 mg by mouth at bedtime.      . Multiple Vitamins-Minerals (MULTIVITAMIN WITH MINERALS) tablet Take 1 tablet by mouth daily.       No current facility-administered medications on file prior to visit.      Objective:   Physical Exam  Vitals reviewed. Constitutional: She is oriented to person, place, and time. She appears well-developed and well-nourished. No distress.  HENT:  Head: Normocephalic and atraumatic.  Right Ear: Tympanic membrane, external ear and ear canal  normal.  Left Ear: Tympanic membrane, external ear and ear canal normal.  Nose: Nose normal.  Mouth/Throat: Uvula is midline, oropharynx is clear and moist and mucous membranes are normal. No oropharyngeal exudate.  Right temple with well healing surgical scar. No surrounding erythema or drainage noted. Bilateral temples nontender  Eyes: Conjunctivae and EOM are normal. Pupils are equal, round, and reactive to light. No  scleral icterus.  Neck: Trachea normal and normal range of motion.  Cardiovascular: Normal rate, regular rhythm and intact distal pulses.  Exam reveals no gallop and no friction rub.   No murmur heard. Pulses:      Radial pulses are 2+ on the right side, and 2+ on the left side.       Posterior tibial pulses are 2+ on the right side, and 2+ on the left side.  Pulmonary/Chest: Effort normal and breath sounds normal. She has no decreased breath sounds. She has no wheezes. She has no rhonchi. She has no rales.  Abdominal: Soft. Normal appearance and bowel sounds are normal. There is no hepatosplenomegaly. There is no tenderness. There is no rigidity, no rebound and no guarding.  Genitourinary:  deferred  Musculoskeletal: Normal range of motion.  FROM U/LE bilateral, FROM thoracic and lumbar spine. No midline tenderness noted.   Lymphadenopathy:       Head (right side): No submental, no submandibular, no tonsillar, no preauricular, no posterior auricular and no occipital adenopathy present.       Head (left side): No submental, no submandibular, no tonsillar, no preauricular, no posterior auricular and no occipital adenopathy present.    She has no cervical adenopathy.       Right: No supraclavicular adenopathy present.       Left: No supraclavicular adenopathy present.  Neurological: She is alert and oriented to person, place, and time. She has normal strength. No cranial nerve deficit or sensory deficit. She displays a negative Romberg sign. Coordination and gait normal. GCS eye subscore is 4. GCS verbal subscore is 5. GCS motor subscore is 6.  Reflex Scores:      Patellar reflexes are 2+ on the right side and 2+ on the left side. Skin: Skin is warm and dry. She is not diaphoretic.  Psychiatric: Her speech is normal and behavior is normal. Judgment and thought content normal. Her mood appears anxious. Cognition and memory are normal.   Lab Results  Component Value Date   WBC 8.1 12/26/2013    HGB 12.0 12/26/2013   HCT 34.8* 12/26/2013   PLT 395 12/26/2013   GLUCOSE 119* 12/26/2013   ALT 11 12/26/2013   AST 20 12/26/2013   NA 137 12/26/2013   K 3.8 12/26/2013   CL 96 12/26/2013   CREATININE 0.73 12/26/2013   BUN 9 12/26/2013   CO2 23 12/26/2013   TSH 2.428 12/26/2013   Filed Vitals:   01/16/14 1455  BP: 122/70  Pulse: 100  Temp: 98.1 F (36.7 C)  Resp: 16   Wt Readings from Last 3 Encounters:  01/16/14 140 lb (63.504 kg)  12/27/13 139 lb 9.6 oz (63.322 kg)  12/27/13 139 lb 9.6 oz (63.322 kg)      Assessment & Plan:     CPX/v70.0 - Patient has been counseled on age-appropriate routine health concerns for screening and prevention. These are reviewed and up-to-date. Immunizations are up-to-date or declined. Labs and ECG reviewed.  Temporal arteritis: Refill Prednisone 20 mg. Instructed to discontinue the 60 mg dose and begin to take as below:  Take 2 tablets (40 mg total) daily for the next two weeks, then take one tablet ( 20 mg total) for the next two weeks. Then continue with 20 mg daily from then. You can make the decrease in medication as long as you are not experiencing a change in vision or increase in headaches or head sensitivity.  Instructed to return to the office in one month for further evaluation of your prednisone usage.   Night sweats: Negative chest xray TSH normal To monitor frequency until one month follow up  Papilloma Keep appointment as scheduled for surgical removal.

## 2014-01-19 ENCOUNTER — Ambulatory Visit (INDEPENDENT_AMBULATORY_CARE_PROVIDER_SITE_OTHER): Payer: Medicare HMO | Admitting: General Surgery

## 2014-01-19 ENCOUNTER — Encounter (INDEPENDENT_AMBULATORY_CARE_PROVIDER_SITE_OTHER): Payer: Self-pay | Admitting: General Surgery

## 2014-01-19 VITALS — BP 134/78 | HR 68 | Temp 98.2°F | Resp 14 | Ht 65.0 in | Wt 138.0 lb

## 2014-01-19 DIAGNOSIS — D249 Benign neoplasm of unspecified breast: Secondary | ICD-10-CM

## 2014-01-19 DIAGNOSIS — N6320 Unspecified lump in the left breast, unspecified quadrant: Secondary | ICD-10-CM

## 2014-01-19 DIAGNOSIS — D242 Benign neoplasm of left breast: Secondary | ICD-10-CM

## 2014-01-19 DIAGNOSIS — N63 Unspecified lump in unspecified breast: Secondary | ICD-10-CM

## 2014-01-19 NOTE — Progress Notes (Signed)
Subjective:   left breast mass  Patient ID: Lindsey [name], female   DOB: 1940-11-13, 73 y.o.   MRN: [id number]  HPI Patient is a pleasant 73 year old female here for evaluation of a left breast mass. The patient was recently admitted to the hospital for a constellation of symptoms suggestive of temporal arteritis. She had a temporal artery biopsy and indeed did have this diagnosis. She is now on prednisone and feeling significantly better. At the time of evaluation she was noted to have a left breast mass which she says has been present since about November. She had not had any recent imaging. She has no personal history of breast disease or any family history of breast cancer. After discharge she underwent evaluation with mammogram and ultrasound at [facility]. I've reviewed the studies. Mammogram revealed about a 5 cm multi-lobulated discrete mass at the 12:00 position in the breast. Ultrasound showed it to 4.7 cm partially solid mostly fluid-filled mass. Port core needle biopsy was performed and most of the was obtained was old blood and fluid but a small area of vascular tissue was targeted. Pathology has returned showing fragments of a papillary lesion most consistent with intraductal papilloma. Mass went down a lot after the fluid was aspirated but has enlarged again somewhat.  History reviewed. No pertinent past medical history. Past Surgical History  Procedure Laterality Date  . Tubal ligation    . Artery biopsy Right 12/27/2013    Procedure: BIOPSY TEMPORAL ARTERY;  Surgeon: Gayland Curry, MD;  Location: [facility];  Service: General;  Laterality: Right;  . Breast surgery      breast biopsy   Current Outpatient Prescriptions  Medication Sig Dispense Refill  . Multiple Vitamins-Minerals (MULTIVITAMIN WITH MINERALS) tablet Take 1 tablet by mouth daily.      . predniSONE (DELTASONE) 20 MG tablet Take 2 tablets (40 mg total) daily for the next two weeks, then take one tablet ( 20 mg total) for the next  two weeks. Then continue with 20 mg daily from then.  60 tablet  0  . aspirin EC 81 MG tablet Take 162 mg by mouth at bedtime.       No current facility-administered medications for this visit.   No Known Allergies History  Substance Use Topics  . Smoking status: Never Smoker   . Smokeless tobacco: Never Used  . Alcohol Use: No     Comment: rare occaisons glass of wine     Review of Systems  Constitutional: Positive for fatigue. Negative for fever and chills.  Cardiovascular: Negative.   Gastrointestinal: Negative.   Neurological: Positive for headaches.       Objective:   Physical Exam BP 134/78  Pulse 68  Temp(Src) 98.2 F (36.8 C) (Oral)  Resp 14  Ht 5\' 5"  (1.651 m)  Wt 138 lb (62.596 kg)  BMI 22.96 kg/m2 General: Alert, well-developed Caucasian female, in no distress Skin: Warm and dry without rash or infection. HEENT: No palpable masses or thyromegaly. Sclera nonicteric. Pupils equal round and reactive. Healing incision right temple Lymph nodes: No cervical, supraclavicular, or inguinal nodes palpable. Breasts: In the upper left breast 12:00 position is a soft rounded approximately 3-1/2 cm mass. No other masses in either breast no palpable axillary adenopathy Lungs: Breath sounds clear and equal without increased work of breathing Cardiovascular: Regular rate and rhythm without murmur. No JVD or edema. Peripheral pulses intact. Abdomen: Nondistended. Soft and nontender. No masses palpable. No organomegaly. No palpable hernias. Extremities: No edema  or joint swelling or deformity. No chronic venous stasis changes. Neurologic: Alert and fully oriented. Gait normal.    Assessment:     Left breast mass with recent imaging and biopsy indicating mostly old hematoma associated with a papillary lesion consistent with breast papilloma. Due to symptoms and to rule out a small chance of associated cancer I recommended complete excision of the area. We discussed the nature of  the surgery and recovery. We discussed risks of general anesthesia the slight risk of bleeding infection or possible need for further treatment based on final diagnosis. All of her about his questions were answered.     Plan:     Left breast lumpectomy under general anesthesia as an outpatient.

## 2014-01-30 ENCOUNTER — Encounter (INDEPENDENT_AMBULATORY_CARE_PROVIDER_SITE_OTHER): Payer: Self-pay

## 2014-01-30 ENCOUNTER — Other Ambulatory Visit (INDEPENDENT_AMBULATORY_CARE_PROVIDER_SITE_OTHER): Payer: Self-pay | Admitting: *Deleted

## 2014-01-30 ENCOUNTER — Other Ambulatory Visit (INDEPENDENT_AMBULATORY_CARE_PROVIDER_SITE_OTHER): Payer: Self-pay | Admitting: General Surgery

## 2014-01-30 DIAGNOSIS — C50919 Malignant neoplasm of unspecified site of unspecified female breast: Secondary | ICD-10-CM

## 2014-01-30 MED ORDER — HYDROCODONE-ACETAMINOPHEN 5-325 MG PO TABS: 1.0000 | ORAL_TABLET | ORAL | Status: DC | PRN

## 2014-02-01 ENCOUNTER — Telehealth (INDEPENDENT_AMBULATORY_CARE_PROVIDER_SITE_OTHER): Payer: Self-pay | Admitting: General Surgery

## 2014-02-01 NOTE — Telephone Encounter (Signed)
Called pt and discussed path report 

## 2014-02-02 ENCOUNTER — Other Ambulatory Visit (INDEPENDENT_AMBULATORY_CARE_PROVIDER_SITE_OTHER): Payer: Self-pay

## 2014-02-02 DIAGNOSIS — C50919 Malignant neoplasm of unspecified site of unspecified female breast: Secondary | ICD-10-CM

## 2014-02-08 ENCOUNTER — Other Ambulatory Visit (INDEPENDENT_AMBULATORY_CARE_PROVIDER_SITE_OTHER): Payer: Self-pay

## 2014-02-08 ENCOUNTER — Encounter: Payer: Self-pay | Admitting: *Deleted

## 2014-02-08 DIAGNOSIS — C50919 Malignant neoplasm of unspecified site of unspecified female breast: Secondary | ICD-10-CM

## 2014-02-08 NOTE — Progress Notes (Signed)
Received the go to scheduled both Med Onc and Rad Onc from [place] at [place] [facility].  Emailed Santiago Glad in [place] to make her aware and took paperwork to Dr. Jana Hakim for an appt.

## 2014-02-08 NOTE — Addendum Note (Signed)
Addended by: Ivor Costa on: 02/08/2014 02:04 PM   Modules accepted: Orders

## 2014-02-10 ENCOUNTER — Encounter: Payer: Self-pay | Admitting: Radiation Oncology

## 2014-02-10 DIAGNOSIS — C50919 Malignant neoplasm of unspecified site of unspecified female breast: Secondary | ICD-10-CM | POA: Insufficient documentation

## 2014-02-10 NOTE — Progress Notes (Signed)
Location of Breast Cancer:  Left Breast, 12 o'clock position   Histology per Pathology Report: Diagnosis 01/03/14: Breast, left, needle core biopsy: DISAGGREGATED FRAGMENTS OF PAPILLARY LESION, SEE COMMENT  Diagnosis 01/03/14: LEFT BREAST, [id number] BENIGN APPEARING DUCTAL EPITHELIAL CELLS AND ABUNDANT BLOOD, NO MALIGNANT CELLS IDENTIFIED  Receptor Status: ER( 100%  ), PR ( 10%), Her2-neu ( -  )  Verbal report per pathology dept, Dr Saralyn Pilar, Dr Lyndon Code  Did patient present with symptoms (if so, please note symptoms) or was this found on screening mammography?: found by patient this past November  Past/Anticipated interventions by surgeon, if any:  01/30/14: Breast, excision, left mass (from previous biopsy)- INVASIVE DUCTAL CARCINOMA WITH PAPILLARY FEATURES (INVASIVE PAPILLARY CARCINOMA), SEE COMMENT.- PREVIOUS BIOPSY SITE PRESENT.- TUMOR IS LESS THAN 0.1MM FROM RESECTION MARGIN,  Dr. Excell Seltzer, Marland Kitchen, Follow up  Appt. 02/17/14  Past/Anticipated interventions by medical oncology, if any: Chemotherapy , Dr Jana Hakim new consultation on 02/17/14  Lymphedema issues, if any: no  Pain issues, if any:   no  SAFETY ISSUES:  Prior radiation? No  Pacemaker/ICD? No  Possible current pregnancy NO  Is the patient on methotrexate? NO  Current Complaints / other details: widowed for many years, retired from tele communications age 73, traveled, was Psychologist, occupational at [employer] x 7 years, 2 daughters, no grandchildren menarche age 65, P9, first live birth age 28, menopause age 60, no HRT    Roslynn Amble, Felicita Gage, RN 02/10/2014,5:40 PM

## 2014-02-11 ENCOUNTER — Other Ambulatory Visit: Payer: Self-pay | Admitting: Oncology

## 2014-02-13 ENCOUNTER — Encounter: Payer: Self-pay | Admitting: *Deleted

## 2014-02-13 ENCOUNTER — Telehealth: Payer: Self-pay | Admitting: *Deleted

## 2014-02-13 DIAGNOSIS — Z170 Estrogen receptor positive status [ER+]: Secondary | ICD-10-CM | POA: Insufficient documentation

## 2014-02-13 DIAGNOSIS — C50412 Malignant neoplasm of upper-outer quadrant of left female breast: Secondary | ICD-10-CM | POA: Insufficient documentation

## 2014-02-13 NOTE — Telephone Encounter (Signed)
Confirmed pt for 02/17/14 at 2:30 with Dr. Jana Hakim.  Notified Dr. Excell Seltzer and his nurse of appt.  Pt denies further needs.  Gave pt contact information.

## 2014-02-13 NOTE — Progress Notes (Signed)
New pt packet mailed

## 2014-02-14 ENCOUNTER — Encounter: Payer: Self-pay | Admitting: *Deleted

## 2014-02-14 NOTE — Progress Notes (Signed)
Chart completed, placed on Dr. Virgie Dad desk for review. Appt 02/17/14.

## 2014-02-15 ENCOUNTER — Encounter: Payer: Self-pay | Admitting: *Deleted

## 2014-02-15 ENCOUNTER — Ambulatory Visit
Admission: RE | Admit: 2014-02-15 | Discharge: 2014-02-15 | Disposition: A | Discharge status: Home / Self Care | Payer: Medicare HMO | Source: Ambulatory Visit | Attending: Radiation Oncology | Admitting: Radiation Oncology

## 2014-02-15 VITALS — BP 127/84 | HR 105 | Temp 97.4°F | Resp 20 | Ht 65.0 in | Wt 142.9 lb

## 2014-02-15 DIAGNOSIS — C50419 Malignant neoplasm of upper-outer quadrant of unspecified female breast: Secondary | ICD-10-CM | POA: Insufficient documentation

## 2014-02-15 DIAGNOSIS — Z510 Encounter for antineoplastic radiation therapy: Secondary | ICD-10-CM | POA: Insufficient documentation

## 2014-02-15 DIAGNOSIS — C50412 Malignant neoplasm of upper-outer quadrant of left female breast: Secondary | ICD-10-CM

## 2014-02-15 HISTORY — DX: Other giant cell arteritis: M31.6

## 2014-02-15 HISTORY — DX: Malignant neoplasm of unspecified site of unspecified female breast: C50.919

## 2014-02-15 NOTE — Progress Notes (Signed)
Radiation Oncology         [phone]) [phone] ________________________________  Initial outpatient Consultation - Date: 02/15/2014   Name: Lindsey [name] MRN: [id number]   DOB: 09-08-1941  REFERRING PHYSICIAN: Mariella Saa, MD  DIAGNOSIS: T1cNX Invasive Ductal Carcinoma of the Left Breast  HISTORY OF PRESENT ILLNESS::Lindsey [name] is a 73 y.o. female  updated a left breast mass in November. She elected not to address this. She was then hospitalized for temporal arteritis. As part of the hospital stay she complained of this left breast mass. An ultrasound and biopsy was performed the ultrasound showed a cystic structure in the left breast. Surgical excision was recommended and this was performed on 01/30/2014. Pathology from this excision showed an invasive ductal carcinoma\invasive papillary carcinoma measuring 1.5 cm. No lymph node sampling was performed. This tumor was reported to be ER/PR positive and HER-2 negative at conference this morning. Margins were close but clear.  She was referred to me from Dr. Johna Sheriff for my opinion regarding radiation in the management of her disease. We discussed her case this morning and multidisciplinary conference. She is seeing surgery on Friday as well as medical oncology. She has healed up well from her surgery. She has no complaints today. No prior history of breast cancer. She is GX P2 with no hormone replacement. She underwent menopause at 11 her first live birth was at 59 and menopause at 77.  PREVIOUS RADIATION THERAPY: No  PAST MEDICAL HISTORY:  has a past medical history of Breast cancer (01/03/14) and Temporal arteritis.    PAST SURGICAL HISTORY: Past Surgical History  Procedure Laterality Date  . Tubal ligation      many years ago  . Artery biopsy Right 12/27/2013    Procedure: BIOPSY TEMPORAL ARTERY;  Surgeon: Atilano Ina, MD;  Location: [facility] OR;  Service: General;  Laterality: Right;  . Breast surgery  01/03/14    breast biopsy     FAMILY HISTORY:  Family History  Problem Relation Age of Onset  . Diabetes Paternal Uncle   . Alzheimer's disease Mother     SOCIAL HISTORY:  History  Substance Use Topics  . Smoking status: Never Smoker   . Smokeless tobacco: Never Used  . Alcohol Use: No     Comment: rare occaisons glass of wine    ALLERGIES: Review of patient's allergies indicates no known allergies.  MEDICATIONS:  Current Outpatient Prescriptions  Medication Sig Dispense Refill  . Multiple Vitamins-Minerals (MULTIVITAMIN WITH MINERALS) tablet Take 1 tablet by mouth daily.      . predniSONE (DELTASONE) 20 MG tablet Take 2 tablets (40 mg total) daily for the next two weeks, then take one tablet ( 20 mg total) for the next two weeks. Then continue with 20 mg daily from then.  60 tablet  0  . aspirin EC 81 MG tablet Take 162 mg by mouth at bedtime.       No current facility-administered medications for this encounter.    REVIEW OF SYSTEMS:  A 15 point review of systems is documented in the electronic medical record. This was obtained by the nursing staff. However, I reviewed this with the patient to discuss relevant findings and make appropriate changes.  Pertinent items are noted in HPI.  PHYSICAL EXAM:  Filed Vitals:   02/15/14 0828  BP: 127/84  Pulse: 105  Temp: 97.4 F (36.3 C)  Resp: 20  .142 lb 14.4 oz (64.819 kg). Pleasant female in no distress appears younger than her  stated age. She has a well-healed curvilinear incision in the superior aspect the left breast. No palpable abnormalities except her scar tissue underneath the incision no palpable abnormalities of the right breast. No palpable cervical subclavicular or axillary adenopathy bilaterally. She is alert and oriented x3.  LABORATORY DATA:  Lab Results  Component Value Date   WBC 8.1 12/26/2013   HGB 12.0 12/26/2013   HCT 34.8* 12/26/2013   MCV 85.3 12/26/2013   PLT 395 12/26/2013   Lab Results  Component Value Date   NA 137 12/26/2013    K 3.8 12/26/2013   CL 96 12/26/2013   CO2 23 12/26/2013   Lab Results  Component Value Date   ALT 11 12/26/2013   AST 20 12/26/2013   ALKPHOS 92 12/26/2013   BILITOT 0.6 12/26/2013     RADIOGRAPHY: No results found.    IMPRESSION: T1 C. N0 invasive ductal/papillary carcinoma of the left breast  PLAN: I discussed with the patient today he the equivalency in terms of survival between mastectomy and lumpectomy plus radiation. We discussed the role of radiation and decreasing local failures in patients who undergo lumpectomy. We discussed that she had not had the lymph nodes removed and therefore her lymph nodes had not been addressed. We discussed that if she chose radiation I would recommend treating with high tangents to treat the lower axillary lymph nodes. We discussed the results of randomized trials showing equivalency in terms of survival between radiation after lumpectomy and radiation plus antiestrogen therapy. We discussed possible side effects of radiation including skin redness and fatigue. I asked her breast cancer navigator to come down and meet with her. She is going to meet with surgery medical oncology on Friday and let me know she is interested in proceeding forward.  I spent 40 minutes  face to face with the patient and more than 50% of that time was spent in counseling and/or coordination of care.   ------------------------------------------------  Thea Silversmith, MD

## 2014-02-15 NOTE — Progress Notes (Signed)
Please see the Nurse Progress Note in the MD Initial Consult Encounter for this patient. 

## 2014-02-16 ENCOUNTER — Ambulatory Visit: Payer: Medicare HMO | Admitting: Physician Assistant

## 2014-02-16 ENCOUNTER — Encounter (INDEPENDENT_AMBULATORY_CARE_PROVIDER_SITE_OTHER): Payer: Self-pay

## 2014-02-17 ENCOUNTER — Ambulatory Visit (INDEPENDENT_AMBULATORY_CARE_PROVIDER_SITE_OTHER): Payer: Medicare HMO | Admitting: General Surgery

## 2014-02-17 ENCOUNTER — Encounter: Payer: Self-pay | Admitting: Physician Assistant

## 2014-02-17 ENCOUNTER — Encounter: Payer: Self-pay | Admitting: Oncology

## 2014-02-17 ENCOUNTER — Other Ambulatory Visit (HOSPITAL_BASED_OUTPATIENT_CLINIC_OR_DEPARTMENT_OTHER): Payer: Medicare HMO

## 2014-02-17 ENCOUNTER — Encounter (INDEPENDENT_AMBULATORY_CARE_PROVIDER_SITE_OTHER): Payer: Self-pay

## 2014-02-17 ENCOUNTER — Ambulatory Visit (HOSPITAL_BASED_OUTPATIENT_CLINIC_OR_DEPARTMENT_OTHER): Payer: Medicare HMO

## 2014-02-17 ENCOUNTER — Ambulatory Visit (HOSPITAL_BASED_OUTPATIENT_CLINIC_OR_DEPARTMENT_OTHER): Payer: Medicare HMO | Admitting: Oncology

## 2014-02-17 ENCOUNTER — Encounter (INDEPENDENT_AMBULATORY_CARE_PROVIDER_SITE_OTHER): Payer: Self-pay | Admitting: General Surgery

## 2014-02-17 VITALS — BP 134/74 | HR 72 | Temp 97.8°F | Ht 65.0 in | Wt 141.6 lb

## 2014-02-17 VITALS — BP 126/77 | HR 106 | Temp 98.1°F | Resp 20 | Ht 65.0 in | Wt 142.2 lb

## 2014-02-17 DIAGNOSIS — C50919 Malignant neoplasm of unspecified site of unspecified female breast: Secondary | ICD-10-CM

## 2014-02-17 DIAGNOSIS — M316 Other giant cell arteritis: Secondary | ICD-10-CM

## 2014-02-17 DIAGNOSIS — C50419 Malignant neoplasm of upper-outer quadrant of unspecified female breast: Secondary | ICD-10-CM

## 2014-02-17 DIAGNOSIS — C50412 Malignant neoplasm of upper-outer quadrant of left female breast: Secondary | ICD-10-CM

## 2014-02-17 LAB — COMPREHENSIVE METABOLIC PANEL (CC13)
ALK PHOS: 80 U/L (ref 40–150)
ALT: 9 U/L (ref 0–55)
AST: 20 U/L (ref 5–34)
Albumin: 3.7 g/dL (ref 3.5–5.0)
Anion Gap: 11 mEq/L (ref 3–11)
BUN: 18.9 mg/dL (ref 7.0–26.0)
CALCIUM: 9.9 mg/dL (ref 8.4–10.4)
CHLORIDE: 104 meq/L (ref 98–109)
CO2: 26 mEq/L (ref 22–29)
Creatinine: 1 mg/dL (ref 0.6–1.1)
Glucose: 145 mg/dl — ABNORMAL HIGH (ref 70–140)
Potassium: 4.5 mEq/L (ref 3.5–5.1)
Sodium: 141 mEq/L (ref 136–145)
Total Bilirubin: 0.47 mg/dL (ref 0.20–1.20)
Total Protein: 6.8 g/dL (ref 6.4–8.3)

## 2014-02-17 LAB — CBC WITH DIFFERENTIAL/PLATELET
BASO%: 0.3 % (ref 0.0–2.0)
BASOS ABS: 0 10*3/uL (ref 0.0–0.1)
EOS ABS: 0 10*3/uL (ref 0.0–0.5)
EOS%: 0 % (ref 0.0–7.0)
HCT: 40.2 % (ref 34.8–46.6)
HEMOGLOBIN: 13.2 g/dL (ref 11.6–15.9)
LYMPH%: 6.1 % — AB (ref 14.0–49.7)
MCH: 28.7 pg (ref 25.1–34.0)
MCHC: 32.7 g/dL (ref 31.5–36.0)
MCV: 87.5 fL (ref 79.5–101.0)
MONO#: 0.2 10*3/uL (ref 0.1–0.9)
MONO%: 1.9 % (ref 0.0–14.0)
NEUT%: 91.7 % — ABNORMAL HIGH (ref 38.4–76.8)
NEUTROS ABS: 8.1 10*3/uL — AB (ref 1.5–6.5)
Platelets: 303 10*3/uL (ref 145–400)
RBC: 4.59 10*6/uL (ref 3.70–5.45)
RDW: 16.5 % — ABNORMAL HIGH (ref 11.2–14.5)
WBC: 8.8 10*3/uL (ref 3.9–10.3)
lymph#: 0.5 10*3/uL — ABNORMAL LOW (ref 0.9–3.3)

## 2014-02-17 NOTE — Progress Notes (Signed)
Chief complaint: Followup left breast lumpectomy  History: Patient returns for followup of left breast lumpectomy. Preoperative biopsy indicated a benign papilloma. However final pathology has revealed a papillary carcinoma. Surgical margins were negative. We did not do a sentinel lymph node biopsy due to her benign core needle biopsy but on discussion at cancer conference medical and radiation oncology we do not plan a sentinel lymph node biopsy as it would not change therapy.  She is feeling well. No postoperative difficulties  Exam: BP 134/74  Pulse 72  Temp(Src) 97.8 F (36.6 C) (Oral)  Ht 5\' 5"  (1.651 m)  Wt 141 lb 9.6 oz (64.229 kg)  BMI 23.56 kg/m2 General: Appears well Breasts: Nicely healing left breast incision with minimal induration  Assessment and plan: Status post lumpectomy for papillary cancer of the left breast. She has seen radiation oncology and will see medical oncology today. Initial recommendations are either radiation or hormonal therapy. I will see her back in 6 months for long-term followup.

## 2014-02-17 NOTE — Progress Notes (Signed)
Checked in new patient with no financial issue.

## 2014-02-17 NOTE — Progress Notes (Signed)
[place]  Telephone:([phone]) [phone] Fax:(336) [phone]     ID: Lindsey [name] OB: February 12, 1941  MR#: [id number]  UYQ#:[id number]  PCP: Scarlette Calico, MD GYN:   SU: Excell Seltzer OTHER MD: Thea Silversmith  CHIEF COMPLAINT: "I was feeling sick in February and they found it had temporal arteritis and also breast cancer"  BREAST CANCER HISTORY: Lindsey [name] noted a mass in her left breast sometime in November. She thought about it but decided to do nothing and specifically she did not bring it to her primary care physician's attention. More recently, in February, she started having systemic symptoms including weight loss, drenching sweats, and overall weakness. She presented to the emergency room with these symptoms and was referred to Dr. Nena Jordan for evaluation. He noted a bilateral temporal artery prominence and tenderness and set the patient up for Temporal artery biopsy, performed to 17 2059 showing ([id number]) giant cell arteritis. She was started on prednisone and her symptoms have considerably improved. She is now on a taper.  As part of her evaluation she was noted to have a left breast mass and was referred to [facility] for evaluation. Ultrasound of the left breast mass to 20 02/27/2014 showed it to be largely cystic and it was aspirated the same day. The pathology ([id number]) showed this aggregated fragments of a papillary lesion without cytologic atypia. Accordingly the patient was referred to Dr. Excell Seltzer for excisional biopsy. This was performed 01/30/2014, and showed ([id number] an invasive ductal carcinoma with papillary features (invasive papillary carcinoma), grade 1, measuring 1.5 cm. Margins were negative but less than a millimeter there was no evidence of lymphovascular invasion. The cells were estrogen receptor 100% positive, progesterone receptor 11% positive, with an MIB-1 of 8% and no HER-2 amplification, the signals ratio being 1.56 and a copy number per cell  3.90.  The patient's case was presented at the multidisciplinary breast cancer conference for age 73. The consensus at that time was that the patient would not need chemotherapy and also would not need sentinel lymph node biopsy as it would not change treatment.  The patient's subsequent history is as detailed below.   INTERVAL HISTORY: Lindsey [name] was evaluated in the breast clinic for 08/29/2014.   REVIEW OF SYSTEMS: And tells me her night sweats have resolved and she is no longer losing weight. Her prednisone is down to 20 mg daily and is being tapered further, to as low dose as she may need to keep her temporal arteritis under control. She has no symptoms related to the recent surgery, and particularly had no pain, fever, bleeding, or swelling. A detailed review of systems today was otherwise noncontributory.  PAST MEDICAL HISTORY: Past Medical History  Diagnosis Date  . Breast cancer 01/03/14    left, 12 o'clock, upper outer  . Temporal arteritis     PAST SURGICAL HISTORY: Past Surgical History  Procedure Laterality Date  . Tubal ligation      many years ago  . Artery biopsy Right 12/27/2013    Procedure: BIOPSY TEMPORAL ARTERY;  Surgeon: Gayland Curry, MD;  Location: [facility];  Service: General;  Laterality: Right;  . Breast surgery  01/03/14    breast biopsy    FAMILY HISTORY Family History  Problem Relation Age of Onset  . Diabetes Paternal Uncle   . Alzheimer's disease Mother    the patient's father died at the age of 6, from unclear causes. The patient's mother died at age 93. The patient had  2 brothers, no sisters. There is no breast or ovarian cancer in the family to her knowledge.  GYNECOLOGIC HISTORY:  Menarche age 35, first live birth age 40, she is Temelec P2. She stopped having periods approximately 1983 and did not take hormone replacement. She did take birth control pills remotely for about 10 years with no complications  SOCIAL HISTORY:  And worked as an Glass blower/designer.  She is now retired. She is a widow. She lives alone at home, with no pets. Her daughter Jonnie Finner lives in [place], where she works for [employer]. Her daughter Claris Gower lives in [place], [place], where she works in a hotel industry. The patient has no grandchildren. She is a Tourist information centre manager    ADVANCED DIRECTIVES: Not in place   HEALTH MAINTENANCE: History  Substance Use Topics  . Smoking status: Never Smoker   . Smokeless tobacco: Never Used  . Alcohol Use: No     Comment: rare occaisons glass of wine     Colonoscopy: Never  PAP:  Bone density: Never  Lipid panel:  No Known Allergies  Current Outpatient Prescriptions  Medication Sig Dispense Refill  . aspirin EC 81 MG tablet Take 162 mg by mouth at bedtime.      . Multiple Vitamins-Minerals (MULTIVITAMIN WITH MINERALS) tablet Take 1 tablet by mouth daily.      . predniSONE (DELTASONE) 20 MG tablet Take 2 tablets (40 mg total) daily for the next two weeks, then take one tablet ( 20 mg total) for the next two weeks. Then continue with 20 mg daily from then.  60 tablet  0   No current facility-administered medications for this visit.    OBJECTIVE: Older white female who appears stated age 73 Vitals:   02/17/14 1451  BP: 126/77  Pulse: 106  Temp: 98.1 F (36.7 C)  Resp: 20     Body mass index is 23.66 kg/(m^2).    ECOG FS:1 - Symptomatic but completely ambulatory  Ocular: Sclerae unicteric, pupils equal, round and reactive to light Ear-nose-throat: Oropharynx clear and moist Lymphatic: No cervical or supraclavicular adenopathy Lungs no rales or rhonchi, good excursion bilaterally Heart regular rate and rhythm, no murmur appreciated Abd soft, nontender, positive bowel sounds MSK no focal spinal tenderness, no joint edema Neuro: non-focal, well-oriented, appropriate affect Breasts: The right breast is unremarkable. The left breast is status post recent excisional biopsy. The incision is healing nicely. The cosmetic  result is good. There are no skin or nipple changes of concern. I do not palpate any abnormalities in the left axilla   LAB RESULTS:  CMP     Component Value Date/Time   NA 137 12/26/2013 1600   K 3.8 12/26/2013 1600   CL 96 12/26/2013 1600   CO2 23 12/26/2013 1600   GLUCOSE 119* 12/26/2013 1600   BUN 9 12/26/2013 1600   CREATININE 0.73 12/26/2013 1600   CREATININE 0.68 12/26/2013 1336   CALCIUM 9.5 12/26/2013 1600   PROT 7.3 12/26/2013 1600   ALBUMIN 3.2* 12/26/2013 1600   AST 20 12/26/2013 1600   ALT 11 12/26/2013 1600   ALKPHOS 92 12/26/2013 1600   BILITOT 0.6 12/26/2013 1600   GFRNONAA 83* 12/26/2013 1600   GFRAA >90 12/26/2013 1600    I No results found for this basename: SPEP, UPEP,  kappa and lambda light chains    Lab Results  Component Value Date   WBC 8.8 02/17/2014   NEUTROABS 8.1* 02/17/2014   HGB 13.2 02/17/2014   HCT 40.2  02/17/2014   MCV 87.5 02/17/2014   PLT 303 02/17/2014      Chemistry      Component Value Date/Time   NA 137 12/26/2013 1600   K 3.8 12/26/2013 1600   CL 96 12/26/2013 1600   CO2 23 12/26/2013 1600   BUN 9 12/26/2013 1600   CREATININE 0.73 12/26/2013 1600   CREATININE 0.68 12/26/2013 1336      Component Value Date/Time   CALCIUM 9.5 12/26/2013 1600   ALKPHOS 92 12/26/2013 1600   AST 20 12/26/2013 1600   ALT 11 12/26/2013 1600   BILITOT 0.6 12/26/2013 1600       No results found for this basename: LABCA2    No components found with this basename: LABCA125    No results found for this basename: INR,  in the last 168 hours  Urinalysis    Component Value Date/Time   COLORURINE YELLOW 12/26/2013 [place] 12/26/2013 1551   LABSPEC 1.009 12/26/2013 1551   PHURINE 8.0 12/26/2013 1551   GLUCOSEU NEGATIVE 12/26/2013 1551   [place] 12/26/2013 1551   [place] 12/26/2013 1551   BILIRUBINUR neg 12/26/2013 1343   KETONESUR 15* 12/26/2013 1551   PROTEINUR NEGATIVE 12/26/2013 1551   PROTEINUR 30 12/26/2013 1343   UROBILINOGEN  1.0 12/26/2013 1551   UROBILINOGEN 1.0 12/26/2013 1343   NITRITE NEGATIVE 12/26/2013 1551   NITRITE neg 12/26/2013 1343   LEUKOCYTESUR NEGATIVE 12/26/2013 1551    STUDIES: No results found.  ASSESSMENT: 73 y.o. [place] woman status post left breast excisional biopsy 01/30/2014 for a pT1c cN0, stage IA invasive ductal (papillary) carcinoma, estrogen receptor 100% positive, progesterone receptor 11% positive, with an MIB-1 of 8% and no HER-2 amplification.  PLAN: We spent the better part of today's hour-long appointment discussing the biology of breast cancer in general, and the specifics of the patient's tumor in particular. Lindsey [name] understands the pattern of her tumor is luminal A- like and therefore her prognosis is good. Her case was discussed at the multidisciplinary breast cancer conference 02/15/2014 and the consensus was she would not need chemotherapy. NCCN guidelines call for anti-estrogen treatment for her systemic disease. The question is whether she would also benefit from radiation therapy.  She has discussed this with Dr. Pablo Ledger and the question was left open. The patient understands randomized studies in women like her tell us that omitting radiation will not affect survival. There is a small increase in local recurrence when radiation is omitted. The concern I have is that although her margins are negative, they are very close. A secondary concern is that we decided to omit a sentinel lymph node biopsy,. For these reasons I suggested she consider radiation before starting antiestrogen therapy  This was a bit confusing to Lindsey [name], who had somehow understood that her choice was either radiation or anti-estrogens but not both. I emphasized that endocrine therapy is standard of care and she should receive that whatever decision she ends up making as far as her radiation treatment is concerned. We then discussed the difference between tamoxifen and  anastrozole. She has a good  understanding of the possible toxicities, side effects and complications of these agents.  After much discussion we decided she would take tamoxifen rather than anastrozole. At the end of our meeting today she was still not clear whether she would take radiation or not. I have gone ahead and put in her tamoxifen prescription so that if she decides to forgo radiation she can go ahead  and start the tamoxifen. In any case she will call us within a few days to let us know her definitive decision.  I have made her a return appointment with me in approximately 4 months, by which time if she is having radiation she will be on tamoxifen for at least 2 months, which will allow [place] to assess how she is tolerating it.  Tedra knows the goal of treatment in her case is cure. She will call us with any problems that may develop before next visit here.   Chauncey Cruel, MD   02/17/2014 2:55 PM

## 2014-02-18 MED ORDER — TAMOXIFEN CITRATE 20 MG PO TABS: 20.0000 mg | ORAL_TABLET | Freq: Every day | ORAL | Status: AC

## 2014-02-21 ENCOUNTER — Encounter (INDEPENDENT_AMBULATORY_CARE_PROVIDER_SITE_OTHER): Payer: Self-pay

## 2014-02-23 ENCOUNTER — Other Ambulatory Visit: Payer: Self-pay | Admitting: Oncology

## 2014-03-01 ENCOUNTER — Ambulatory Visit
Admission: RE | Admit: 2014-03-01 | Discharge: 2014-03-01 | Disposition: A | Discharge status: Home / Self Care | Payer: Medicare HMO | Source: Ambulatory Visit | Attending: Radiation Oncology | Admitting: Radiation Oncology

## 2014-03-01 ENCOUNTER — Encounter: Payer: Self-pay | Admitting: Radiation Oncology

## 2014-03-01 DIAGNOSIS — C50412 Malignant neoplasm of upper-outer quadrant of left female breast: Secondary | ICD-10-CM

## 2014-03-01 NOTE — Progress Notes (Signed)
Name: Lindsey [name]   MRN: [id number]  Date:  03/01/2014  DOB: September 15, 1941  Status:outpatient    DIAGNOSIS: Breast cancer.  CONSENT VERIFIED: yes   SET UP: Patient is setup supine   IMMOBILIZATION:  The following immobilization was used:Custom Moldable Pillow, breast board.   NARRATIVE: [name] was brought to the [facility].  Identity was confirmed.  All relevant records and images related to the planned course of therapy were reviewed.  Then, the patient was positioned in a stable reproducible clinical set-up for radiation therapy.  Wires were placed to delineate the clinical extent of breast tissue. A wire was placed on the scar as well.  CT images were obtained.  An isocenter was placed. Skin markings were placed.  The CT images were loaded into the planning software where the target and avoidance structures were contoured.  The radiation prescription was entered and confirmed. The patient was discharged in stable condition and tolerated simulation well.    TREATMENT PLANNING NOTE:  Treatment planning then occurred. I have requested : MLC's, isodose plan, basic dose calculation  I personally designed and supervised the construction of 3 medically necessary complex treatment devices for the protection of critical normal structures including the lungs and contralateral breast as well as the immobilization device which is necessary for set up certainty.

## 2014-03-02 ENCOUNTER — Encounter: Payer: Self-pay | Admitting: Internal Medicine

## 2014-03-02 ENCOUNTER — Ambulatory Visit (INDEPENDENT_AMBULATORY_CARE_PROVIDER_SITE_OTHER): Payer: Medicare HMO | Admitting: Internal Medicine

## 2014-03-02 ENCOUNTER — Other Ambulatory Visit (INDEPENDENT_AMBULATORY_CARE_PROVIDER_SITE_OTHER): Payer: Medicare HMO

## 2014-03-02 VITALS — BP 132/68 | HR 94 | Temp 97.5°F | Resp 16 | Ht 65.0 in | Wt 145.0 lb

## 2014-03-02 DIAGNOSIS — E785 Hyperlipidemia, unspecified: Secondary | ICD-10-CM | POA: Insufficient documentation

## 2014-03-02 DIAGNOSIS — R7309 Other abnormal glucose: Secondary | ICD-10-CM

## 2014-03-02 DIAGNOSIS — Z0000 Encounter for general adult medical examination without abnormal findings: Secondary | ICD-10-CM

## 2014-03-02 DIAGNOSIS — M316 Other giant cell arteritis: Secondary | ICD-10-CM

## 2014-03-02 DIAGNOSIS — Z23 Encounter for immunization: Secondary | ICD-10-CM

## 2014-03-02 DIAGNOSIS — R739 Hyperglycemia, unspecified: Secondary | ICD-10-CM | POA: Insufficient documentation

## 2014-03-02 LAB — HEMOGLOBIN A1C: HEMOGLOBIN A1C: 5.9 % (ref 4.6–6.5)

## 2014-03-02 LAB — BASIC METABOLIC PANEL
BUN: 14 mg/dL (ref 6–23)
CHLORIDE: 102 meq/L (ref 96–112)
CO2: 31 mEq/L (ref 19–32)
CREATININE: 0.9 mg/dL (ref 0.4–1.2)
Calcium: 9.7 mg/dL (ref 8.4–10.5)
GFR: 68.66 mL/min (ref >60.00)
Glucose, Bld: 113 mg/dL — ABNORMAL HIGH (ref 70–99)
Potassium: 4.7 mEq/L (ref 3.5–5.1)
Sodium: 139 mEq/L (ref 135–145)

## 2014-03-02 LAB — LIPID PANEL
CHOL/HDL RATIO: 3
CHOLESTEROL: 232 mg/dL — AB (ref 0–200)
HDL: 80.3 mg/dL (ref >39.00)
LDL CALC: 129 mg/dL — AB (ref 0–99)
TRIGLYCERIDES: 113 mg/dL (ref 0.0–149.0)
VLDL: 22.6 mg/dL (ref 0.0–40.0)

## 2014-03-02 MED ORDER — PREDNISONE 20 MG PO TABS: ORAL_TABLET | ORAL | Status: DC

## 2014-03-02 NOTE — Assessment & Plan Note (Signed)
I will check her A1C to see if she has developed DM2 

## 2014-03-02 NOTE — Assessment & Plan Note (Signed)
She is doing well on this dose of prednisone, will cont the same for now

## 2014-03-02 NOTE — Assessment & Plan Note (Signed)

## 2014-03-02 NOTE — Patient Instructions (Signed)

## 2014-03-02 NOTE — Progress Notes (Signed)
Pre visit review using our clinic review tool, if applicable. No additional management support is needed unless otherwise documented below in the visit note. 

## 2014-03-02 NOTE — Assessment & Plan Note (Signed)
I will check her FLP today 

## 2014-03-02 NOTE — Progress Notes (Signed)
   Subjective:    Patient ID: Lindsey [name], female    DOB: [birth year], 73 y.o.   MRN: [id number]  HPI Comments: She returns for follow up on TA - on the right. She feels like she is improving with no headaches or visual changes.     Review of Systems  Constitutional: Negative.  Negative for fever, chills, diaphoresis, appetite change and fatigue.  HENT: Negative.   Eyes: Negative.   Respiratory: Negative.  Negative for cough, choking, chest tightness, shortness of breath, wheezing and stridor.   Cardiovascular: Negative.  Negative for chest pain, palpitations and leg swelling.  Gastrointestinal: Negative.  Negative for nausea, vomiting, abdominal pain, diarrhea, constipation and blood in stool.  Endocrine: Negative.  Negative for polydipsia, polyphagia and polyuria.  Genitourinary: Negative.   Musculoskeletal: Negative.  Negative for arthralgias, back pain, gait problem, joint swelling, neck pain and neck stiffness.  Skin: Negative.   Allergic/Immunologic: Negative.   Neurological: Negative.  Negative for dizziness, tremors, facial asymmetry, weakness, light-headedness, numbness and headaches.  Hematological: Negative.  Negative for adenopathy. Does not bruise/bleed easily.  Psychiatric/Behavioral: Negative.        Objective:   Physical Exam  Vitals reviewed. Constitutional: She is oriented to person, place, and time. She appears well-developed and well-nourished. No distress.  HENT:  Head: Normocephalic and atraumatic.  Mouth/Throat: Oropharynx is clear and moist. No oropharyngeal exudate.  Eyes: Conjunctivae and EOM are normal. Pupils are equal, round, and reactive to light. Right eye exhibits no discharge. Left eye exhibits no discharge. No scleral icterus.  Neck: Normal range of motion. Neck supple. No JVD present. No tracheal deviation present. No thyromegaly present.  Cardiovascular: Normal rate, regular rhythm, normal heart sounds and intact distal pulses.  Exam reveals  no gallop and no friction rub.   No murmur heard. Pulmonary/Chest: Effort normal and breath sounds normal. No stridor. No respiratory distress. She has no wheezes. She has no rales. She exhibits no tenderness.  Abdominal: Soft. Bowel sounds are normal. She exhibits no distension and no mass. There is no tenderness. There is no rebound and no guarding.  Musculoskeletal: Normal range of motion. She exhibits no edema and no tenderness.  Lymphadenopathy:    She has no cervical adenopathy.  Neurological: She is alert and oriented to person, place, and time. She has normal reflexes. She displays normal reflexes. No cranial nerve deficit. She exhibits normal muscle tone. Coordination normal.  Skin: Skin is warm and dry. No rash noted. She is not diaphoretic. No erythema. No pallor.  Psychiatric: She has a normal mood and affect. Her behavior is normal. Judgment and thought content normal.     Lab Results  Component Value Date   WBC 8.8 02/17/2014   HGB 13.2 02/17/2014   HCT 40.2 02/17/2014   PLT 303 02/17/2014   GLUCOSE 145* 02/17/2014   ALT 9 02/17/2014   AST 20 02/17/2014   NA 141 02/17/2014   K 4.5 02/17/2014   CL 96 12/26/2013   CREATININE 1.0 02/17/2014   BUN 18.9 02/17/2014   CO2 26 02/17/2014   TSH 2.428 12/26/2013       Assessment & Plan:

## 2014-03-08 ENCOUNTER — Ambulatory Visit
Admission: RE | Admit: 2014-03-08 | Discharge: 2014-03-08 | Disposition: A | Discharge status: Home / Self Care | Payer: Medicare HMO | Source: Ambulatory Visit | Attending: Radiation Oncology | Admitting: Radiation Oncology

## 2014-03-08 DIAGNOSIS — C50412 Malignant neoplasm of upper-outer quadrant of left female breast: Secondary | ICD-10-CM

## 2014-03-08 NOTE — Progress Notes (Signed)
  Radiation Oncology         [phone] ________________________________  Name: Lindsey [name] MRN: [id number]  Date: 03/08/2014  DOB: Aug 01, 1941  Simulation Verification Note  Status: outpatient  NARRATIVE: The patient was brought to the treatment unit and placed in the planned treatment position. The clinical setup was verified. Then port films were obtained and uploaded to the radiation oncology medical record software.  The treatment beams were carefully compared against the planned radiation fields. The position location and shape of the radiation fields was reviewed. The targeted volume of tissue appears appropriately covered by the radiation beams. Organs at risk appear to be excluded as planned.  Based on my personal review, I approved the simulation verification. The patient's treatment will proceed as planned.  ------------------------------------------------  Thea Silversmith, MD

## 2014-03-09 ENCOUNTER — Ambulatory Visit
Admission: RE | Admit: 2014-03-09 | Discharge: 2014-03-09 | Disposition: A | Discharge status: Home / Self Care | Payer: Medicare HMO | Source: Ambulatory Visit | Attending: Radiation Oncology | Admitting: Radiation Oncology

## 2014-03-10 ENCOUNTER — Ambulatory Visit
Admission: RE | Admit: 2014-03-10 | Discharge: 2014-03-10 | Disposition: A | Discharge status: Home / Self Care | Payer: Medicare HMO | Source: Ambulatory Visit | Attending: Radiation Oncology | Admitting: Radiation Oncology

## 2014-03-10 DIAGNOSIS — C50412 Malignant neoplasm of upper-outer quadrant of left female breast: Secondary | ICD-10-CM

## 2014-03-10 MED ORDER — ALRA NON-METALLIC DEODORANT (RAD-ONC)
1.0000 "application " | Freq: Once | TOPICAL | Status: AC
  Administered 2014-03-10: 1 via TOPICAL

## 2014-03-10 MED ORDER — RADIAPLEXRX EX GEL
Freq: Once | CUTANEOUS | Status: AC
  Administered 2014-03-10: 12:00:00 via TOPICAL

## 2014-03-10 NOTE — Progress Notes (Signed)
Routine of clinic reviewed and patient education completed.Given Radiation Therapy and You Booklet, skin care sheet,  alra deodorant, and radiaplex.Reviewed skin care and when to apply gel.Informed not to wear underwire bra.aware of possible skin changes (discoloration), peeling, tenderness and swelling.Has my card and number and aware if any problems to inform us while here for treatment.

## 2014-03-13 ENCOUNTER — Ambulatory Visit
Admission: RE | Admit: 2014-03-13 | Discharge: 2014-03-13 | Disposition: A | Discharge status: Home / Self Care | Payer: Medicare HMO | Source: Ambulatory Visit | Attending: Radiation Oncology | Admitting: Radiation Oncology

## 2014-03-14 ENCOUNTER — Ambulatory Visit
Admission: RE | Admit: 2014-03-14 | Discharge: 2014-03-14 | Disposition: A | Discharge status: Home / Self Care | Payer: Medicare HMO | Source: Ambulatory Visit | Attending: Radiation Oncology | Admitting: Radiation Oncology

## 2014-03-14 VITALS — BP 121/75 | HR 82 | Temp 98.4°F | Wt 147.0 lb

## 2014-03-14 DIAGNOSIS — C50412 Malignant neoplasm of upper-outer quadrant of left female breast: Secondary | ICD-10-CM

## 2014-03-14 NOTE — Progress Notes (Signed)
Weekly Management Note Current Dose:  10.68 Gy  Projected Dose:42.72  Gy   Narrative:  The patient presents for routine under treatment assessment.  CBCT/MVCT images/Port film x-rays were reviewed.  The chart was checked. Doing well. No breast related complaints. Dental surgery tomorrow so she will miss treatment.   Physical Findings: Weight: 147 lb (66.679 kg). Unchanged  Impression:  The patient is tolerating radiation.  Plan:  Continue treatment as planned. Continue radiaplex.

## 2014-03-14 NOTE — Progress Notes (Signed)
Weekly assessment of radiation to left breast.Completed 4 of 16 treatments.Denies pain.No skin changes.Takes prednisone for temporal arteritis.

## 2014-03-15 ENCOUNTER — Ambulatory Visit: Payer: Medicare HMO

## 2014-03-16 ENCOUNTER — Ambulatory Visit
Admission: RE | Admit: 2014-03-16 | Discharge: 2014-03-16 | Disposition: A | Discharge status: Home / Self Care | Payer: Medicare HMO | Source: Ambulatory Visit | Attending: Radiation Oncology | Admitting: Radiation Oncology

## 2014-03-17 ENCOUNTER — Ambulatory Visit
Admission: RE | Admit: 2014-03-17 | Discharge: 2014-03-17 | Disposition: A | Discharge status: Home / Self Care | Payer: Medicare HMO | Source: Ambulatory Visit | Attending: Radiation Oncology | Admitting: Radiation Oncology

## 2014-03-20 ENCOUNTER — Ambulatory Visit
Admission: RE | Admit: 2014-03-20 | Discharge: 2014-03-20 | Disposition: A | Discharge status: Home / Self Care | Payer: Medicare HMO | Source: Ambulatory Visit | Attending: Radiation Oncology | Admitting: Radiation Oncology

## 2014-03-21 ENCOUNTER — Ambulatory Visit
Admission: RE | Admit: 2014-03-21 | Discharge: 2014-03-21 | Disposition: A | Discharge status: Home / Self Care | Payer: Medicare HMO | Source: Ambulatory Visit | Attending: Radiation Oncology | Admitting: Radiation Oncology

## 2014-03-21 VITALS — BP 122/74 | HR 90 | Temp 98.2°F | Wt 143.8 lb

## 2014-03-21 DIAGNOSIS — C50412 Malignant neoplasm of upper-outer quadrant of left female breast: Secondary | ICD-10-CM

## 2014-03-21 NOTE — Progress Notes (Signed)
Weekly Management Note Current Dose: 21.36  Gy  Projected Dose: 42.72 Gy   Narrative:  The patient presents for routine under treatment assessment.  CBCT/MVCT images/Port film x-rays were reviewed.  The chart was checked. Doing well. No complaints. Dental procedure went well.   Physical Findings: Weight: 143 lb 12.8 oz (65.227 kg). Unchanged. Minimal skin redness.   Impression:  The patient is tolerating radiation.  Plan:  Continue treatment as planned. Continue RT/

## 2014-03-21 NOTE — Progress Notes (Signed)
Weekly assessment of radiation to left breast .completed 8 of 16 treatments.skin is pink in some areas.Denies pain or fatigue..No questions or concerns today.

## 2014-03-22 ENCOUNTER — Telehealth: Payer: Self-pay | Admitting: Internal Medicine

## 2014-03-22 ENCOUNTER — Ambulatory Visit
Admission: RE | Admit: 2014-03-22 | Discharge: 2014-03-22 | Disposition: A | Discharge status: Home / Self Care | Payer: Medicare HMO | Source: Ambulatory Visit | Attending: Radiation Oncology | Admitting: Radiation Oncology

## 2014-03-22 DIAGNOSIS — M316 Other giant cell arteritis: Secondary | ICD-10-CM

## 2014-03-22 NOTE — Telephone Encounter (Signed)
Patient is calling in regards to her Prednisone rx. States that we rx'd her Prednisone for 20mg  but at her last OV it was discussed cutting her dosage back to 10mg . Please follow-up.

## 2014-03-23 ENCOUNTER — Ambulatory Visit
Admission: RE | Admit: 2014-03-23 | Discharge: 2014-03-23 | Disposition: A | Discharge status: Home / Self Care | Payer: Medicare HMO | Source: Ambulatory Visit | Attending: Radiation Oncology | Admitting: Radiation Oncology

## 2014-03-23 MED ORDER — PREDNISONE 10 MG PO TABS: 10.0000 mg | ORAL_TABLET | Freq: Every day | ORAL | Status: DC

## 2014-03-23 NOTE — Telephone Encounter (Signed)
Pt stated that there is enough of the Prednisone 20mg  rx left to last still Monday or Tuesday of next week. The rx is being changed to the 10mg  per pt statement.  Is it okay to go ahead and call in the Prednisone 10mg  for the pt? And is all other orders for that rx the same? Please advise Thanks!

## 2014-03-23 NOTE — Telephone Encounter (Signed)
done

## 2014-03-23 NOTE — Telephone Encounter (Signed)
She can break it in half to get that dose

## 2014-03-24 ENCOUNTER — Ambulatory Visit
Admission: RE | Admit: 2014-03-24 | Discharge: 2014-03-24 | Disposition: A | Discharge status: Home / Self Care | Payer: Medicare HMO | Source: Ambulatory Visit | Attending: Radiation Oncology | Admitting: Radiation Oncology

## 2014-03-27 ENCOUNTER — Ambulatory Visit
Admission: RE | Admit: 2014-03-27 | Discharge: 2014-03-27 | Disposition: A | Discharge status: Home / Self Care | Payer: Medicare HMO | Source: Ambulatory Visit | Attending: Radiation Oncology | Admitting: Radiation Oncology

## 2014-03-28 ENCOUNTER — Encounter: Payer: Self-pay | Admitting: Radiation Oncology

## 2014-03-28 ENCOUNTER — Ambulatory Visit
Admission: RE | Admit: 2014-03-28 | Discharge: 2014-03-28 | Disposition: A | Discharge status: Home / Self Care | Payer: Medicare HMO | Source: Ambulatory Visit | Attending: Radiation Oncology | Admitting: Radiation Oncology

## 2014-03-28 VITALS — BP 137/66 | HR 89 | Wt 144.0 lb

## 2014-03-28 DIAGNOSIS — C50412 Malignant neoplasm of upper-outer quadrant of left female breast: Secondary | ICD-10-CM

## 2014-03-28 NOTE — Progress Notes (Signed)
Tolerating radiation therapy well. Denies pain in her left/treated breast. Mild hyperpigmentation without desquamation of left/treated breast. Reports using radiaplex bid as directed. Reports mild fatigue.

## 2014-03-28 NOTE — Progress Notes (Signed)
Weekly Management Note Current Dose:  34.71 Gy  Projected Dose: 42.72 Gy   Narrative:  The patient presents for routine under treatment assessment.  CBCT/MVCT images/Port film x-rays were reviewed.  The chart was checked. Doing well. No complaints. ASked about being in the sun this summer  Physical Findings: Weight: 144 lb (65.318 kg). Pink skin over breast.  Impression:  The patient is tolerating radiation.  Plan:  Continue treatment as planned. Discussed radiation recall and sun protection in the treated area.

## 2014-03-29 ENCOUNTER — Ambulatory Visit
Admission: RE | Admit: 2014-03-29 | Discharge: 2014-03-29 | Disposition: A | Discharge status: Home / Self Care | Payer: Medicare HMO | Source: Ambulatory Visit | Attending: Radiation Oncology | Admitting: Radiation Oncology

## 2014-03-30 ENCOUNTER — Ambulatory Visit
Admission: RE | Admit: 2014-03-30 | Discharge: 2014-03-30 | Disposition: A | Discharge status: Home / Self Care | Payer: Medicare HMO | Source: Ambulatory Visit | Attending: Radiation Oncology | Admitting: Radiation Oncology

## 2014-03-30 ENCOUNTER — Ambulatory Visit: Payer: Medicare HMO

## 2014-03-31 ENCOUNTER — Encounter: Payer: Self-pay | Admitting: Radiation Oncology

## 2014-03-31 ENCOUNTER — Ambulatory Visit
Admission: RE | Admit: 2014-03-31 | Discharge: 2014-03-31 | Disposition: A | Discharge status: Home / Self Care | Payer: Medicare HMO | Source: Ambulatory Visit | Attending: Radiation Oncology | Admitting: Radiation Oncology

## 2014-04-03 NOTE — Progress Notes (Signed)
  Radiation Oncology         [phone] ________________________________  Name: Lindsey [name] MRN: [id number]  Date: 03/31/2014  DOB: Sep 07, 1941  End of Treatment Note  Diagnosis:   T1cNx Invasive Ductal Carcinoma of the Left Breast     Indication for treatment:  Curative       Radiation treatment dates:   03/09/2014-03/31/2014  Site/dose:     Left breast/ 42.72 Gy at 2.67 Gy per fraction x 21 fractions.   Beams/energy:  Opposed tangents with reduced fields / 6 MV photons  Narrative: The patient tolerated radiation treatment relatively well.   She had minimal dermatitis and minimal fatigue.   Plan: The patient has completed radiation treatment. The patient will return to radiation oncology clinic for routine followup in one month. I advised them to call or return sooner if they have any questions or concerns related to their recovery or treatment.  ------------------------------------------------  Thea Silversmith, MD

## 2014-04-07 NOTE — Progress Notes (Signed)
Radiation Oncology         [phone] ________________________________  Name: Lindsey [name]      MRN: [id number]          Date: 03/01/2014              DOB: 1941-05-05  Optical Surface Tracking Plan:  Since intensity modulated radiotherapy (IMRT) and 3D conformal radiation treatment methods are predicated on accurate and precise positioning for treatment, intrafraction motion monitoring is medically necessary to ensure accurate and safe treatment delivery.  The ability to quantify intrafraction motion without excessive ionizing radiation dose can only be performed with optical surface tracking. Accordingly, surface imaging offers the opportunity to obtain 3D measurements of patient position throughout IMRT and 3D treatments without excessive radiation exposure.  I am ordering optical surface tracking for this patient's upcoming course of radiotherapy. ________________________________ Signature   Reference:   Ursula Alert, J, et al. Surface imaging-based analysis of intrafraction motion for breast radiotherapy patients.Journal of [place], n. 6, nov. 2014. ISSN [id number].   Available at: <http://www.jacmp.org/index.php/jacmp/article/view/4957>.

## 2014-05-01 ENCOUNTER — Encounter: Payer: Self-pay | Admitting: *Deleted

## 2014-05-02 ENCOUNTER — Telehealth: Payer: Self-pay | Admitting: Oncology

## 2014-05-02 NOTE — Telephone Encounter (Signed)
s.w. pt and advised on Aug appt....pt ok and aware °

## 2014-05-11 ENCOUNTER — Ambulatory Visit: Payer: Medicare HMO | Admitting: Radiation Oncology

## 2014-05-18 ENCOUNTER — Ambulatory Visit
Admission: RE | Admit: 2014-05-18 | Discharge: 2014-05-18 | Disposition: A | Discharge status: Home / Self Care | Payer: Medicare HMO | Source: Ambulatory Visit | Attending: Radiation Oncology | Admitting: Radiation Oncology

## 2014-05-18 ENCOUNTER — Encounter: Payer: Self-pay | Admitting: Radiation Oncology

## 2014-05-18 VITALS — BP 128/84 | HR 98 | Temp 98.2°F | Resp 20 | Ht 65.0 in | Wt 143.5 lb

## 2014-05-18 DIAGNOSIS — C50412 Malignant neoplasm of upper-outer quadrant of left female breast: Secondary | ICD-10-CM

## 2014-05-18 NOTE — Progress Notes (Signed)
Follow up left breast rad txs 03/09/14-03/31/14, occasional  Discomfort left breast only momentarily then goes away,  No other issues, hasn't set up mammogram in future, gave [facility] flyer will think about it, no longer using radiaplex or lotion, energy  Level good, appetite  Good 2:00 PM

## 2014-05-18 NOTE — Progress Notes (Signed)
   Department of Radiation Oncology  Phone:  [phone] Fax:        [phone]   Name: Lindsey [name] MRN: [id number]  DOB: 09-14-1941  Date: 05/18/2014  Follow Up Visit Note  Diagnosis: T1cN0 Invasive Ductal Carcinoma of the Left Breast  Summary and Interval since last radiation: 42.72 gray completed 03/31/2014  Interval History: Lindsey [name] presents today for routine followup.  She has done well. Her skin is healing well. She is on tamoxifen and has been tolerating that well. She has no complaints.  Allergies: No Known Allergies  Medications:  Current Outpatient Prescriptions  Medication Sig Dispense Refill  . aspirin EC 81 MG tablet Take 162 mg by mouth at bedtime.      . Multiple Vitamins-Minerals (MULTIVITAMIN WITH MINERALS) tablet Take 1 tablet by mouth daily.      . predniSONE (DELTASONE) 10 MG tablet Take 1 tablet (10 mg total) by mouth daily with breakfast.  30 tablet  2  . tamoxifen (NOLVADEX) 20 MG tablet Take 20 mg by mouth daily.       No current facility-administered medications for this encounter.    Physical Exam:  Filed Vitals:   05/18/14 1355  BP: 128/84  Pulse: 98  Temp: 98.2 F (36.8 C)  TempSrc: Oral  Resp: 20  Height: 5\' 5"  (1.651 m)  Weight: 143 lb 8 oz (65.091 kg)   no hyperpigmentation in the treated area. Her skin is healed up well.  IMPRESSION: Lindsey [name] is a 73 y.o. female status post radiation to the left breast with resolving acute effects of treatment  PLAN:  We discussed the importance of yearly mammograms. She is unsure if she would like to proceed on with that. We discussed the importance of following up with medical oncology at least every 6 months. We discussed the importance of sun protection in the treated area. I encouraged her to contact me with any questions. She agreed to do so.    Thea Silversmith, MD

## 2014-06-13 ENCOUNTER — Ambulatory Visit (HOSPITAL_BASED_OUTPATIENT_CLINIC_OR_DEPARTMENT_OTHER): Payer: Medicare HMO | Admitting: Adult Health

## 2014-06-13 ENCOUNTER — Other Ambulatory Visit: Payer: Self-pay | Admitting: *Deleted

## 2014-06-13 ENCOUNTER — Other Ambulatory Visit (HOSPITAL_BASED_OUTPATIENT_CLINIC_OR_DEPARTMENT_OTHER): Payer: Medicare HMO

## 2014-06-13 ENCOUNTER — Encounter: Payer: Self-pay | Admitting: Adult Health

## 2014-06-13 VITALS — BP 113/89 | HR 78 | Temp 98.2°F | Resp 18 | Ht 65.0 in | Wt 144.5 lb

## 2014-06-13 DIAGNOSIS — C50419 Malignant neoplasm of upper-outer quadrant of unspecified female breast: Secondary | ICD-10-CM

## 2014-06-13 DIAGNOSIS — C50412 Malignant neoplasm of upper-outer quadrant of left female breast: Secondary | ICD-10-CM

## 2014-06-13 DIAGNOSIS — Z170 Estrogen receptor positive status [ER+]: Secondary | ICD-10-CM

## 2014-06-13 LAB — CBC WITH DIFFERENTIAL/PLATELET
BASO%: 0.6 % (ref 0.0–2.0)
Basophils Absolute: 0 10*3/uL (ref 0.0–0.1)
EOS%: 0.4 % (ref 0.0–7.0)
Eosinophils Absolute: 0 10*3/uL (ref 0.0–0.5)
HEMATOCRIT: 39.4 % (ref 34.8–46.6)
HGB: 12.8 g/dL (ref 11.6–15.9)
LYMPH#: 0.6 10*3/uL — AB (ref 0.9–3.3)
LYMPH%: 8.8 % — ABNORMAL LOW (ref 14.0–49.7)
MCH: 29.6 pg (ref 25.1–34.0)
MCHC: 32.5 g/dL (ref 31.5–36.0)
MCV: 91.2 fL (ref 79.5–101.0)
MONO#: 0.4 10*3/uL (ref 0.1–0.9)
MONO%: 5.3 % (ref 0.0–14.0)
NEUT#: 6 10*3/uL (ref 1.5–6.5)
NEUT%: 84.9 % — AB (ref 38.4–76.8)
Platelets: 234 10*3/uL (ref 145–400)
RBC: 4.32 10*6/uL (ref 3.70–5.45)
RDW: 13.8 % (ref 11.2–14.5)
WBC: 7.1 10*3/uL (ref 3.9–10.3)

## 2014-06-13 LAB — COMPREHENSIVE METABOLIC PANEL (CC13)
ALT: 6 U/L (ref 0–55)
AST: 18 U/L (ref 5–34)
Albumin: 3.7 g/dL (ref 3.5–5.0)
Alkaline Phosphatase: 47 U/L (ref 40–150)
Anion Gap: 8 mEq/L (ref 3–11)
BILIRUBIN TOTAL: 0.48 mg/dL (ref 0.20–1.20)
BUN: 16.7 mg/dL (ref 7.0–26.0)
CO2: 28 mEq/L (ref 22–29)
Calcium: 9.4 mg/dL (ref 8.4–10.4)
Chloride: 105 mEq/L (ref 98–109)
Creatinine: 1 mg/dL (ref 0.6–1.1)
Glucose: 104 mg/dl (ref 70–140)
Potassium: 4.4 mEq/L (ref 3.5–5.1)
SODIUM: 141 meq/L (ref 136–145)
TOTAL PROTEIN: 6.6 g/dL (ref 6.4–8.3)

## 2014-06-13 NOTE — Patient Instructions (Signed)
You are doing well.  You have no sign of recurrence.  Continue taking Tamoxifen daily.  Should you develop any swelling in your legs please let us know.  Should you develop any vaginal bleeding please let us know.  I recommend you see a gynecologist every year, particularly while taking tamoxifen.   I recommend a screening colonoscopy.  I recommend routine follow up with a primary care doctor where they check your cholesterol, blood pressure, and over all health.  Continue to follow up with an ophthalmologist.    We will see you back in 6 months.  Please call us if you are having any problems before then and we can see you sooner.  I recommend healthy diet, exercise, and monthly breast exams.    Breast Self-Awareness Practicing breast self-awareness may pick up problems early, prevent significant medical complications, and possibly save your life. By practicing breast self-awareness, you can become familiar with how your breasts look and feel and if your breasts are changing. This allows you to notice changes early. It can also offer you some reassurance that your breast health is good. One way to learn what is normal for your breasts and whether your breasts are changing is to do a breast self-exam. If you find a lump or something that was not present in the past, it is best to contact your caregiver right away. Other findings that should be evaluated by your caregiver include nipple discharge, especially if it is bloody; skin changes or reddening; areas where the skin seems to be pulled in (retracted); or new lumps and bumps. Breast pain is seldom associated with cancer (malignancy), but should also be evaluated by a caregiver. HOW TO PERFORM A BREAST SELF-EXAM The best time to examine your breasts is 5-7 days after your menstrual period is over. During menstruation, the breasts are lumpier, and it may be more difficult to pick up changes. If you do not menstruate, have reached menopause, or had your  uterus removed (hysterectomy), you should examine your breasts at regular intervals, such as monthly. If you are breastfeeding, examine your breasts after a feeding or after using a breast pump. Breast implants do not decrease the risk for lumps or tumors, so continue to perform breast self-exams as recommended. Talk to your caregiver about how to determine the difference between the implant and breast tissue. Also, talk about the amount of pressure you should use during the exam. Over time, you will become more familiar with the variations of your breasts and more comfortable with the exam. A breast self-exam requires you to remove all your clothes above the waist. 1. Look at your breasts and nipples. Stand in front of a mirror in a room with good lighting. With your hands on your hips, push your hands firmly downward. Look for a difference in shape, contour, and size from one breast to the other (asymmetry). Asymmetry includes puckers, dips, or bumps. Also, look for skin changes, such as reddened or scaly areas on the breasts. Look for nipple changes, such as discharge, dimpling, repositioning, or redness. 2. Carefully feel your breasts. This is best done either in the shower or tub while using soapy water or when flat on your back. Place the arm (on the side of the breast you are examining) above your head. Use the pads (not the fingertips) of your three middle fingers on your opposite hand to feel your breasts. Start in the underarm area and use  inch (2 cm) overlapping circles to feel  your breast. Use 3 different levels of pressure (light, medium, and firm pressure) at each circle before moving to the next circle. The light pressure is needed to feel the tissue closest to the skin. The medium pressure will help to feel breast tissue a little deeper, while the firm pressure is needed to feel the tissue close to the ribs. Continue the overlapping circles, moving downward over the breast until you feel your  ribs below your breast. Then, move one finger-width towards the center of the body. Continue to use the  inch (2 cm) overlapping circles to feel your breast as you move slowly up toward the collar bone (clavicle) near the base of the neck. Continue the up and down exam using all 3 pressures until you reach the middle of the chest. Do this with each breast, carefully feeling for lumps or changes. 3.  Keep a written record with breast changes or normal findings for each breast. By writing this information down, you do not need to depend only on memory for size, tenderness, or location. Write down where you are in your menstrual cycle, if you are still menstruating. Breast tissue can have some lumps or thick tissue. However, see your caregiver if you find anything that concerns you.  SEEK MEDICAL CARE IF:  You see a change in shape, contour, or size of your breasts or nipples.   You see skin changes, such as reddened or scaly areas on the breasts or nipples.   You have an unusual discharge from your nipples.   You feel a new lump or unusually thick areas.  Document Released: 10/27/2005 Document Revised: 10/13/2012 Document Reviewed: 02/11/2012 [facility] Patient Information 2015 [place], [place]. This information is not intended to replace advice given to you by your health care provider. Make sure you discuss any questions you have with your health care provider.  Colonoscopy A colonoscopy is an exam to look at the entire large intestine (colon). This exam can help find problems such as tumors, polyps, inflammation, and areas of bleeding. The exam takes about 1 hour.  LET [facility] CARE PROVIDER KNOW ABOUT:   Any allergies you have.  All medicines you are taking, including vitamins, herbs, eye drops, creams, and over-the-counter medicines.  Previous problems you or members of your family have had with the use of anesthetics.  Any blood disorders you have.  Previous surgeries you have  had.  Medical conditions you have. RISKS AND COMPLICATIONS  Generally, this is a safe procedure. However, as with any procedure, complications can occur. Possible complications include:  Bleeding.  Tearing or rupture of the colon wall.  Reaction to medicines given during the exam.  Infection (rare). BEFORE THE PROCEDURE   Ask your health care provider about changing or stopping your regular medicines.  You may be prescribed an oral bowel prep. This involves drinking a large amount of medicated liquid, starting the day before your procedure. The liquid will cause you to have multiple loose stools until your stool is almost clear or light green. This cleans out your colon in preparation for the procedure.  Do not eat or drink anything else once you have started the bowel prep, unless your health care provider tells you it is safe to do so.  Arrange for someone to drive you home after the procedure. PROCEDURE   You will be given medicine to help you relax (sedative).  You will lie on your side with your knees bent.  A long, flexible tube with a  light and camera on the end (colonoscope) will be inserted through the rectum and into the colon. The camera sends video back to a computer screen as it moves through the colon. The colonoscope also releases carbon dioxide gas to inflate the colon. This helps your health care provider see the area better.  During the exam, your health care provider may take a small tissue sample (biopsy) to be examined under a microscope if any abnormalities are found.  The exam is finished when the entire colon has been viewed. AFTER THE PROCEDURE   Do not drive for 24 hours after the exam.  You may have a small amount of blood in your stool.  You may pass moderate amounts of gas and have mild abdominal cramping or bloating. This is caused by the gas used to inflate your colon during the exam.  Ask when your test results will be ready and how you will  get your results. Make sure you get your test results. Document Released: 10/24/2000 Document Revised: 08/17/2013 Document Reviewed: 07/04/2013 [facility] Patient Information 2015 [place], [place]. This information is not intended to replace advice given to you by your health care provider. Make sure you discuss any questions you have with your health care provider.

## 2014-06-13 NOTE — Progress Notes (Signed)
[facility]  Telephone:(336) [phone] Fax:(336) [phone]     ID: ELAYJAH CHANEY OB: [birth year]  MR#: [id number]  GML#:[id number]  PCP: Sanda Linger, MD GYN:   SU: Glenna Fellows OTHER MD: Lurline Hare  CHIEF COMPLAINT: "I was feeling sick in February and they found it had temporal arteritis and also breast cancer"  BREAST CANCER HISTORY: Sache noted a mass in her left breast sometime in November. She thought about it but decided to do nothing and specifically she did not bring it to her primary care physician's attention. More recently, in February, she started having systemic symptoms including weight loss, drenching sweats, and overall weakness. She presented to the emergency room with these symptoms and was referred to Dr. Earl Lites for evaluation. He noted a bilateral temporal artery prominence and tenderness and set the patient up for Temporal artery biopsy, performed to 17 2059 showing ([id number]) giant cell arteritis. She was started on prednisone and her symptoms have considerably improved. She is now on a taper.  As part of her evaluation she was noted to have a left breast mass and was referred to [facility] for evaluation. Ultrasound of the left breast mass to 20 02/27/2014 showed it to be largely cystic and it was aspirated the same day. The pathology ([id number] [id number]) showed this aggregated fragments of a papillary lesion without cytologic atypia. Accordingly the patient was referred to Dr. Johna Sheriff for excisional biopsy. This was performed 01/30/2014, and showed ([id number] an invasive ductal carcinoma with papillary features (invasive papillary carcinoma), grade 1, measuring 1.5 cm. Margins were negative but less than a millimeter there was no evidence of lymphovascular invasion. The cells were estrogen receptor 100% positive, progesterone receptor 11% positive, with an MIB-1 of 8% and no HER-2 amplification, the signals ratio being 1.56 and a copy number per cell  3.90.  The patient's case was presented at the multidisciplinary breast cancer conference for age 73. The consensus at that time was that the patient would not need chemotherapy and also would not need sentinel lymph node biopsy as it would not change treatment.  The patient's subsequent history is as detailed below.   INTERVAL HISTORY: Megann is here today for follow up and evaluation of her breast cancer.  She is doing moderately well today.  She is taking Tamoxifen daily and tolerating it moderately well.  She continues on steroids for her temporal arteritis.  She tells me today that she does not like coming to the doctor.  She denies new pain, breast changes, does not examine her breasts, fevers, chills, night sweats, or any further concerns.  Her health maintenance was reviewed below.   REVIEW OF SYSTEMS: A 10 point review of systems was conducted and is otherwise negative except for what is noted above.      PAST MEDICAL HISTORY: Past Medical History  Diagnosis Date  . Breast cancer 01/03/14    left, 12 o'clock, upper outer  . Temporal arteritis   . Radiation 03/09/14-03/31/14    Left Breast     PAST SURGICAL HISTORY: Past Surgical History  Procedure Laterality Date  . Tubal ligation      many years ago  . Artery biopsy Right 12/27/2013    Procedure: BIOPSY TEMPORAL ARTERY;  Surgeon: Atilano Ina, MD;  Location: [facility] OR;  Service: General;  Laterality: Right;  . Breast surgery  01/03/14    breast biopsy    FAMILY HISTORY Family History  Problem Relation Age of Onset  .  Diabetes Paternal Uncle   . Alzheimer's disease Mother    the patient's father died at the age of 59, from unclear causes. The patient's mother died at age 5. The patient had 2 brothers, no sisters. There is no breast or ovarian cancer in the family to her knowledge.  GYNECOLOGIC HISTORY:  Menarche age 61, first live birth age 58, she is [place] P2. She stopped having periods approximately 1983 and did not take  hormone replacement. 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 She lives alone at home, with no pets. Her daughter Jonnie Finner lives in [place], where she works for [employer]. Her daughter Claris Gower lives in [place], [place], where she works in a hotel industry. The patient has no grandchildren. She is a Tourist information centre manager    ADVANCED DIRECTIVES: Not in place   HEALTH MAINTENANCE: History  Substance Use Topics  . Smoking status: Never Smoker   . Smokeless tobacco: Never Used  . Alcohol Use: No     Comment: rare occaisons glass of wine     Colonoscopy: Never  PAP: not recenty  Bone density: Never  Lipid panel: not recently  Eye exam in 02/2014  No Known Allergies  Current Outpatient Prescriptions  Medication Sig Dispense Refill  . aspirin EC 81 MG tablet Take 162 mg by mouth at bedtime.      . Multiple Vitamins-Minerals (MULTIVITAMIN WITH MINERALS) tablet Take 1 tablet by mouth daily.      . predniSONE (DELTASONE) 10 MG tablet Take 1 tablet (10 mg total) by mouth daily with breakfast.  30 tablet  2  . tamoxifen (NOLVADEX) 20 MG tablet Take 20 mg by mouth daily.       No current facility-administered medications for this visit.    OBJECTIVE: Older white female who appears stated age 73 Vitals:   06/13/14 1044  BP: 113/89  Pulse: 78  Temp: 98.2 F (36.8 C)  Resp: 18     Body mass index is 24.05 kg/(m^2).     GENERAL: Patient is a well appearing female in no acute distress HEENT:  Sclerae anicteric.  Oropharynx clear and moist. No ulcerations or evidence of oropharyngeal candidiasis. Neck is supple.  NODES:  No cervical, supraclavicular, or axillary lymphadenopathy palpated.  BREAST EXAM:  Left breast s/p lumpectomy, no nodularity, no sign of recurrence, right breast without nodules or masses LUNGS:  Clear to auscultation bilaterally.  No wheezes  or rhonchi. HEART:  Regular rate and rhythm. No murmur appreciated. ABDOMEN:  Soft, nontender.  Positive, normoactive bowel sounds. No organomegaly palpated. MSK:  No focal spinal tenderness to palpation. Full range of motion bilaterally in the upper extremities. EXTREMITIES:  No peripheral edema.   SKIN:  Clear with no obvious rashes or skin changes. No nail dyscrasia. NEURO:  Nonfocal. Well oriented.  Flat affect   ECOG FS:1 - Symptomatic but completely ambulatory     LAB RESULTS:  CMP     Component Value Date/Time   NA 139 03/02/2014 1403   NA 141 02/17/2014 1441   K 4.7 03/02/2014 1403   K 4.5 02/17/2014 1441   CL 102 03/02/2014 1403   CO2 31 03/02/2014 1403   CO2 26 02/17/2014 1441   GLUCOSE 113* 03/02/2014 1403   GLUCOSE 145* 02/17/2014 1441   BUN 14 03/02/2014 1403   BUN 18.9 02/17/2014 1441   CREATININE  0.9 03/02/2014 1403   CREATININE 1.0 02/17/2014 1441   CREATININE 0.68 12/26/2013 1336   CALCIUM 9.7 03/02/2014 1403   CALCIUM 9.9 02/17/2014 1441   PROT 6.8 02/17/2014 1441   PROT 7.3 12/26/2013 1600   ALBUMIN 3.7 02/17/2014 1441   ALBUMIN 3.2* 12/26/2013 1600   AST 20 02/17/2014 1441   AST 20 12/26/2013 1600   ALT 9 02/17/2014 1441   ALT 11 12/26/2013 1600   ALKPHOS 80 02/17/2014 1441   ALKPHOS 92 12/26/2013 1600   BILITOT 0.47 02/17/2014 1441   BILITOT 0.6 12/26/2013 1600   GFRNONAA 83* 12/26/2013 1600   GFRAA >90 12/26/2013 1600    I No results found for this basename: SPEP,  UPEP,   kappa and lambda light chains    Lab Results  Component Value Date   WBC 7.1 06/13/2014   NEUTROABS 6.0 06/13/2014   HGB 12.8 06/13/2014   HCT 39.4 06/13/2014   MCV 91.2 06/13/2014   PLT 234 06/13/2014      Chemistry      Component Value Date/Time   NA 139 03/02/2014 1403   NA 141 02/17/2014 1441   K 4.7 03/02/2014 1403   K 4.5 02/17/2014 1441   CL 102 03/02/2014 1403   CO2 31 03/02/2014 1403   CO2 26 02/17/2014 1441   BUN 14 03/02/2014 1403   BUN 18.9 02/17/2014 1441   CREATININE 0.9 03/02/2014 1403    CREATININE 1.0 02/17/2014 1441   CREATININE 0.68 12/26/2013 1336      Component Value Date/Time   CALCIUM 9.7 03/02/2014 1403   CALCIUM 9.9 02/17/2014 1441   ALKPHOS 80 02/17/2014 1441   ALKPHOS 92 12/26/2013 1600   AST 20 02/17/2014 1441   AST 20 12/26/2013 1600   ALT 9 02/17/2014 1441   ALT 11 12/26/2013 1600   BILITOT 0.47 02/17/2014 1441   BILITOT 0.6 12/26/2013 1600       No results found for this basename: LABCA2    No components found with this basename: LABCA125    No results found for this basename: INR,  in the last 168 hours  Urinalysis    Component Value Date/Time   COLORURINE YELLOW 12/26/2013 [place] 12/26/2013 1551   LABSPEC 1.009 12/26/2013 1551   PHURINE 8.0 12/26/2013 1551   GLUCOSEU NEGATIVE 12/26/2013 [place] 12/26/2013 [place] 12/26/2013 1551   BILIRUBINUR neg 12/26/2013 1343   KETONESUR 15* 12/26/2013 [place] 12/26/2013 1551   PROTEINUR 30 12/26/2013 1343   UROBILINOGEN 1.0 12/26/2013 1551   UROBILINOGEN 1.0 12/26/2013 1343   NITRITE NEGATIVE 12/26/2013 1551   NITRITE neg 12/26/2013 1343   LEUKOCYTESUR NEGATIVE 12/26/2013 1551    STUDIES: No results found.  ASSESSMENT: 74 y.o. [place] woman status post left breast excisional biopsy 01/30/2014 for a pT1c cN0, stage IA invasive ductal (papillary) carcinoma, estrogen receptor 100% positive, progesterone receptor 11% positive, with an MIB-1 of 8% and no HER-2 amplification.  1. Poppi underwent radiation from 03/09/2014 through 03/31/2014  2.  She was started on Tamoxifen daily on 04/06/2014.  PLAN:  Elease is doing moderately well today.  She has no sign of recurrence.  I recommended that she continue taking Tamoxifen daily.  I reviewed again the adverse effects of Tamoxifen, particularly the need to see a gynecologist annually, which she did tell me that she had never done.   In reviewing her health maintenance I recommended she consider having a  colonoscopy, and to follow up with a primary care doctor who can check her cholesterol, blood pressure, and her over all health.  I recommended she continue to f/u with her ophthalmologist.  All of this was given to her in her AVS.    Kaniah will return in 6 months for labs, and evaluation by Dr. Jana Hakim.  Taliya knows the goal of treatment in her case is cure. She will call us with any problems that may develop before next visit here.  I spent 25 minutes counseling the patient face to face.  The total time spent in the appointment was 30 minutes.  Minette Headland, [place] [phone] 06/13/2014 11:42 AM

## 2014-06-14 ENCOUNTER — Telehealth: Payer: Self-pay | Admitting: Oncology

## 2014-06-14 NOTE — Telephone Encounter (Signed)
lvm for pt regarding to Feb 2016 appt....mailed pt appt sched/avs and letter °

## 2014-06-19 ENCOUNTER — Other Ambulatory Visit: Payer: Self-pay | Admitting: Internal Medicine

## 2014-07-18 ENCOUNTER — Other Ambulatory Visit: Payer: Self-pay | Admitting: Internal Medicine

## 2014-08-16 ENCOUNTER — Other Ambulatory Visit: Payer: Self-pay | Admitting: Internal Medicine

## 2014-08-23 ENCOUNTER — Encounter: Payer: Self-pay | Admitting: Internal Medicine

## 2014-08-23 ENCOUNTER — Ambulatory Visit (INDEPENDENT_AMBULATORY_CARE_PROVIDER_SITE_OTHER): Payer: Medicare HMO | Admitting: Internal Medicine

## 2014-08-23 VITALS — BP 132/62 | HR 93 | Temp 98.2°F | Resp 16 | Ht 66.0 in | Wt 143.0 lb

## 2014-08-23 DIAGNOSIS — H25011 Cortical age-related cataract, right eye: Secondary | ICD-10-CM

## 2014-08-23 DIAGNOSIS — H25019 Cortical age-related cataract, unspecified eye: Secondary | ICD-10-CM | POA: Insufficient documentation

## 2014-08-23 DIAGNOSIS — M316 Other giant cell arteritis: Secondary | ICD-10-CM

## 2014-08-23 MED ORDER — PREDNISONE 5 MG PO TABS: 5.0000 mg | ORAL_TABLET | Freq: Every day | ORAL | Status: DC

## 2014-08-23 NOTE — Progress Notes (Signed)
   Subjective:    Patient ID: Lindsey [name], female    DOB: [birth year], 73 y.o.   MRN: [id number]  HPI Comments: She returns for follow up regarding temporal arteritis, she is feeling well with the exception of decreased VA in her right eye that she thinks is due to a cataract. Otherwise she is doing well.     Review of Systems  Constitutional: Negative.  Negative for fever, chills, diaphoresis, appetite change and fatigue.  HENT: Negative for congestion and trouble swallowing.   Eyes: Positive for visual disturbance. Negative for redness.  Respiratory: Negative.  Negative for cough, choking, chest tightness, shortness of breath, wheezing and stridor.   Cardiovascular: Negative.  Negative for chest pain, palpitations and leg swelling.  Gastrointestinal: Negative.  Negative for nausea, vomiting, abdominal pain, diarrhea and constipation.  Endocrine: Negative.   Genitourinary: Negative.   Musculoskeletal: Negative.  Negative for back pain, gait problem, joint swelling, myalgias and neck pain.  Allergic/Immunologic: Negative.   Neurological: Negative.  Negative for dizziness, tremors, seizures, syncope, facial asymmetry, speech difficulty, weakness, light-headedness, numbness and headaches.  Hematological: Negative.  Negative for adenopathy. Does not bruise/bleed easily.  Psychiatric/Behavioral: Negative.        Objective:   Physical Exam  Vitals reviewed. Constitutional: She is oriented to person, place, and time. She appears well-developed and well-nourished.  Non-toxic appearance. She does not have a sickly appearance. She does not appear ill. No distress.  HENT:  Head: Normocephalic and atraumatic.  Mouth/Throat: Oropharynx is clear and moist. No oropharyngeal exudate.  Eyes: Conjunctivae and EOM are normal. Pupils are equal, round, and reactive to light. Right eye exhibits no discharge. Left eye exhibits no discharge. No scleral icterus.  Neck: Normal range of motion. Neck  supple. No JVD present. No tracheal deviation present. No thyromegaly present.  Cardiovascular: Normal rate, regular rhythm, normal heart sounds and intact distal pulses.  Exam reveals no gallop and no friction rub.   No murmur heard. Pulmonary/Chest: Effort normal and breath sounds normal. No stridor. No respiratory distress. She has no wheezes. She has no rales. She exhibits no tenderness.  Abdominal: Soft. Bowel sounds are normal. She exhibits no distension and no mass. There is no tenderness. There is no rebound and no guarding.  Musculoskeletal: Normal range of motion. She exhibits no edema and no tenderness.  Lymphadenopathy:    She has no cervical adenopathy.  Neurological: She is alert and oriented to person, place, and time. She has normal reflexes. She displays normal reflexes. No cranial nerve deficit. She exhibits normal muscle tone. Coordination normal.  Skin: Skin is warm and dry. No rash noted. She is not diaphoretic. No erythema. No pallor.  Psychiatric: She has a normal mood and affect. Her behavior is normal. Judgment and thought content normal.     Lab Results  Component Value Date   WBC 7.1 06/13/2014   HGB 12.8 06/13/2014   HCT 39.4 06/13/2014   PLT 234 06/13/2014   GLUCOSE 104 06/13/2014   CHOL 232* 03/02/2014   TRIG 113.0 03/02/2014   HDL 80.30 03/02/2014   LDLCALC 129* 03/02/2014   ALT 6 06/13/2014   AST 18 06/13/2014   NA 141 06/13/2014   K 4.4 06/13/2014   CL 102 03/02/2014   CREATININE 1.0 06/13/2014   BUN 16.7 06/13/2014   CO2 28 06/13/2014   TSH 2.428 12/26/2013   HGBA1C 5.9 03/02/2014       Assessment & Plan:

## 2014-08-23 NOTE — Progress Notes (Signed)
Pre visit review using our clinic review tool, if applicable. No additional management support is needed unless otherwise documented below in the visit note. 

## 2014-08-23 NOTE — Patient Instructions (Signed)
Temporal Arteritis Arteries are blood vessels that carry blood from the heart to all parts of the body. Temporal arteritis is a swelling (inflammation) of certain large arteries. This usually affects arteries in the head and neck area, including arteries in the area on the side of the head, between the ears and eyes (temples). The condition can be very painful. It also can cause serious problems, even blindness. Early diagnosis and treatment is very important. CAUSES  Temporal arteritis results from the body reacting to injury or infection (inflammation). This may occur when the body's immune system (which fights germs and disease) makes a mistake. It attacks its own arteries. No one knows why this happens. However, certain things (risk factors) make it more likely that a person will develop temporal arteritis. They include:  Age. Most people with temporal arteritis are older than 50. The average age is 70.  Sex. Three times more women than men develop the condition.  Race and ethnic background. Caucasians are more likely to have temporal arteritis than other races. So are people whose families came from [place] ([place], [place], [place] or [place]).  Having polymyalgia rheumatica (PMR). This condition causes stiffness and pain in the joints of the neck, shoulders and hips. About 15% of people with PMR also have temporal arteritis. SYMPTOMS  Not everyone with temporal arteritis has the same symptoms. Some people have just one symptom. Others may have several. The most common symptom is a new headache, often in the temple region. Symptoms may show up in other parts of the body too.   Symptoms affecting the head may include:  Temporal arteries that feel hard or swollen. It may hurt when the temples are touched.  Pain when combing your hair, or when laying your head on a pillow.  Pain in the jaw when chewing.  Pain in the throat or tongue.  Visual problems, including sudden loss of  vision in one eye, or seeing double.  Symptoms in other parts of the body may include:  Fever.  Fatigue.  A dry cough.  Pain in the hips and shoulders.  Pain in the arms during exercise.  Depression.  Weight loss. DIAGNOSIS  Symptoms of temporal arteritis are similar to symptoms for other conditions. This can make it hard to tell if you have the condition. To be sure, your caregiver will ask about your symptoms and do a physical exam. Certain tests may be necessary, such as:   An exam of your temples. Often, the temporal arteries will be swollen and hard. This can be felt.  A complete blood count. This test shows how many red blood cells are in your blood. Most people with temporal arteritis do not have enough red blood cells (anemic).  Erythrocyte sedimentation (also called sed rate test). It measures inflammation in the body. Almost everyone with temporal arteritis has a high sed rate.  C reactive protein test. This also shows if there is inflammation.  Biopsy a temporal artery. This means the caregiver will take out a small piece of an artery. Then, it is checked under a microscope for inflammation. More than one biopsy may be needed. That is because inflammation can be in one part of an artery and not in others. Your caregiver may need to check more than one spot. TREATMENT  Starting treatment right away is very important. Often, you will need to see a specialist in immunologic diseases (rheumatologist). Goals of treatment include protecting your eyesight. Once vision is gone, it might not come   back. The normal treatment is medication. It usually works well and quickly. Most people start getting better in a few days. Medication options include:  Corticosteroids. These are powerful drugs that fight inflammation. These drugs are most often used to treat temporal arteritis.  Usually, a high dose is taken at first. After symptoms improve, a smaller dose is used. The goal is to take  the smallest dose possible and still control your symptoms. That is because using corticosteroids for a long time can cause problems. They can make muscles and bones weak. They can cause blood pressure to go up, and cause diabetes. Also, people often gain weight when they take corticosteroids. Corticosteroids may need to be taken for one or two years.  Several newer drugs are being tested to treat temporal arteritis. Researchers are testing to see if new drugs will work as well as corticosteroids, but cause fewer problems than them. Testing of these drugs is not yet complete.  Some specialists recommend low dose aspirin to prevent blood clots. HOME CARE INSTRUCTIONS   Take any corticosteroids that your caregiver prescribes. Follow the directions carefully.  Take any vitamins or supplements that your caregiver suggests. This may include vitamin D and calcium. They help keep your bones from becoming weak.  Keep all appointments for checkups. Your caregiver will watch for any problems from the medication. Checkups may include:  Periodic blood tests.  Bone density testing. This checks how strong or weak your bones are.  Blood pressure checks. If your blood pressure rises, you may need to take a drug to control it while you are taking corticosteroids.  Blood sugar checks. This is to be sure you are not developing diabetes. If you have diabetes, corticosteroid medications may make it worse and require increased treatment.  Exercise. First, talk with your caregiver about what would be OK for you to do. Aerobic exercise (which increases your heart rate) is usually suggested. It does not have to require a lot of energy. Walking is aerobic exercise. This type of exercise is good because it helps prevent bone loss. It also helps control your blood pressure.  Follow a healthy diet. The goal is to prevent bone damage and diabetes. Include good sources of protein in your diet. Also, include fruits,  vegetables and whole grains. Your caregiver can refer you to an expert on healthy eating (dietitian) for more advice. SEEK MEDICAL CARE IF:   The symptoms that lead to your diagnosis return.  You develop worsening fever, fatigue, headache, weight loss, or pain in your jaw.  You develop signs of infection. Infections can be worse if you are on corticosteroid medication. SEEK IMMEDIATE MEDICAL CARE IF:   Your eyesight changes.  Pain does not go away, even after taking pain medicine.  You feel pain in your chest.  Breathing is difficult.  One side of your face or body suddenly becomes weak or numb.  You develop a fever of more than 102 F (38.9 C). Document Released: 08/24/2009 Document Revised: 01/19/2012 Document Reviewed: 12/21/2013 ExitCare Patient Information 2015 [employer]. This information is not intended to replace advice given to you by your health care provider. Make sure you discuss any questions you have with your health care provider.  

## 2014-08-25 ENCOUNTER — Encounter: Payer: Self-pay | Admitting: Internal Medicine

## 2014-08-25 NOTE — Assessment & Plan Note (Signed)
She is doing well and appears to be ready for a decrease in her prednisone dose Will lower from 10 mg to 5 mg and follow for symptoms recurrence

## 2014-08-25 NOTE — Assessment & Plan Note (Signed)
ophth referral to have this evaluated

## 2014-09-01 ENCOUNTER — Telehealth: Payer: Self-pay | Admitting: Internal Medicine

## 2014-09-01 NOTE — Telephone Encounter (Signed)
Patient states prednisone was cut down to 5mg  and was told if she had issue to call back.  She is having increased pain.  Patient took 10mg  this morning.  She is requesting to increase dosage back to 10mg .

## 2014-09-01 NOTE — Telephone Encounter (Signed)
How about 7.5 mg? 1.5 tabs each day

## 2014-09-01 NOTE — Telephone Encounter (Signed)
Notified pt with md response.../lmb 

## 2014-11-09 ENCOUNTER — Telehealth: Payer: Self-pay | Admitting: Oncology

## 2014-11-09 NOTE — Telephone Encounter (Signed)
Lvm advising appt time chg on 2/3 (md pal) from 10am to 12.45pm with HF. Also mailed appt calendar.

## 2014-12-12 ENCOUNTER — Other Ambulatory Visit: Payer: Self-pay | Admitting: *Deleted

## 2014-12-12 DIAGNOSIS — C50412 Malignant neoplasm of upper-outer quadrant of left female breast: Secondary | ICD-10-CM

## 2014-12-13 ENCOUNTER — Telehealth: Payer: Self-pay | Admitting: Nurse Practitioner

## 2014-12-13 ENCOUNTER — Ambulatory Visit (HOSPITAL_BASED_OUTPATIENT_CLINIC_OR_DEPARTMENT_OTHER): Payer: Medicare HMO | Admitting: Nurse Practitioner

## 2014-12-13 ENCOUNTER — Other Ambulatory Visit (HOSPITAL_BASED_OUTPATIENT_CLINIC_OR_DEPARTMENT_OTHER): Payer: Medicare HMO

## 2014-12-13 ENCOUNTER — Encounter: Payer: Self-pay | Admitting: Nurse Practitioner

## 2014-12-13 ENCOUNTER — Ambulatory Visit: Payer: Medicare HMO | Admitting: Oncology

## 2014-12-13 ENCOUNTER — Other Ambulatory Visit: Payer: Medicare HMO

## 2014-12-13 VITALS — BP 138/72 | HR 88 | Temp 98.5°F | Resp 18 | Ht 66.0 in | Wt 142.4 lb

## 2014-12-13 DIAGNOSIS — C50412 Malignant neoplasm of upper-outer quadrant of left female breast: Secondary | ICD-10-CM

## 2014-12-13 DIAGNOSIS — Z170 Estrogen receptor positive status [ER+]: Secondary | ICD-10-CM

## 2014-12-13 LAB — CBC WITH DIFFERENTIAL/PLATELET
BASO%: 0.6 % (ref 0.0–2.0)
Basophils Absolute: 0 10*3/uL (ref 0.0–0.1)
EOS%: 0.3 % (ref 0.0–7.0)
Eosinophils Absolute: 0 10*3/uL (ref 0.0–0.5)
HCT: 40.4 % (ref 34.8–46.6)
HGB: 12.9 g/dL (ref 11.6–15.9)
LYMPH#: 0.8 10*3/uL — AB (ref 0.9–3.3)
LYMPH%: 12.7 % — ABNORMAL LOW (ref 14.0–49.7)
MCH: 29.4 pg (ref 25.1–34.0)
MCHC: 32 g/dL (ref 31.5–36.0)
MCV: 92.1 fL (ref 79.5–101.0)
MONO#: 0.3 10*3/uL (ref 0.1–0.9)
MONO%: 5.2 % (ref 0.0–14.0)
NEUT#: 4.9 10*3/uL (ref 1.5–6.5)
NEUT%: 81.2 % — AB (ref 38.4–76.8)
Platelets: 241 10*3/uL (ref 145–400)
RBC: 4.39 10*6/uL (ref 3.70–5.45)
RDW: 13.7 % (ref 11.2–14.5)
WBC: 6 10*3/uL (ref 3.9–10.3)

## 2014-12-13 LAB — COMPREHENSIVE METABOLIC PANEL (CC13)
ALT: 8 U/L (ref 0–55)
ANION GAP: 11 meq/L (ref 3–11)
AST: 20 U/L (ref 5–34)
Albumin: 4 g/dL (ref 3.5–5.0)
Alkaline Phosphatase: 60 U/L (ref 40–150)
BILIRUBIN TOTAL: 0.34 mg/dL (ref 0.20–1.20)
BUN: 16.7 mg/dL (ref 7.0–26.0)
CO2: 26 mEq/L (ref 22–29)
Calcium: 9.1 mg/dL (ref 8.4–10.4)
Chloride: 105 mEq/L (ref 98–109)
Creatinine: 0.9 mg/dL (ref 0.6–1.1)
EGFR: 62 mL/min/{1.73_m2} — ABNORMAL LOW (ref >90)
Glucose: 116 mg/dl (ref 70–140)
Potassium: 4.5 mEq/L (ref 3.5–5.1)
Sodium: 141 mEq/L (ref 136–145)
Total Protein: 6.9 g/dL (ref 6.4–8.3)

## 2014-12-13 NOTE — Progress Notes (Signed)
Lindsey [name]  [phone] [phone]     ID: Lindsey [name] OB: Aug 09, 1941  MR#: [id number]  AQT#:[id number]  PCP: Scarlette Calico, MD GYN:   SU: Excell Seltzer OTHER MD: [facility] [facility] CURRENT TREATMENT: tamoxifen daily  BREAST CANCER HISTORY: Lindsey [name] noted a mass in her left breast sometime in November. She thought about it but decided to do nothing and specifically she did not bring it to her primary care physician's attention. More recently, in February, she started having systemic symptoms including weight loss, drenching sweats, and overall weakness. She presented to the emergency room with these symptoms and was referred to Dr. Nena Jordan for evaluation. He noted a bilateral temporal artery prominence and tenderness and set the patient up for Temporal artery biopsy, performed to 17 2059 showing ([id number]) giant cell arteritis. She was started on prednisone and her symptoms have considerably improved. She is now on a taper.  As part of her evaluation she was noted to have a left breast mass and was referred to [facility] for evaluation. Ultrasound of the left breast mass to 20 02/27/2014 showed it to be largely cystic and it was aspirated the same day. The pathology ([id number] [id number]) showed this aggregated fragments of a papillary lesion without cytologic atypia. Accordingly the patient was referred to Dr. Excell Seltzer for excisional biopsy. This was performed 01/30/2014, and showed ([id number] an invasive ductal carcinoma with papillary features (invasive papillary carcinoma), grade 1, measuring 1.5 cm. Margins were negative but less than a millimeter there was no evidence of lymphovascular invasion. The cells were estrogen receptor 100% positive, progesterone receptor 11% positive, with an MIB-1 of 8% and no HER-2 amplification, the signals ratio being 1.56 and a copy number per cell 3.90.  The patient's case was presented at the  multidisciplinary breast cancer conference for age 74. The consensus at that time was that the patient would not need chemotherapy and also would not need sentinel lymph node biopsy as it would not change treatment.  The patient's subsequent history is as detailed below.   INTERVAL HISTORY: Lindsey [name] returns today for follow up and evaluation of her breast cancer. She has been on tamoxifen since May 2015 and is tolerating it well with few complaints. She has some mild vaginal discharge and rare hot flashes. She has joint pain that goes away with stretching. The interval history is generally unremarkable. She continues on steroids for her temporal arteritis, but is down from $RemoveB'60mg'RUIAVRht$  to $R'5mg'MG$  daily. She tolerates those well.    REVIEW OF SYSTEMS: Lindsey [name] denies fevers, chills, nausea, vomiting, or changes in bowel or bladder habits. She has no mouth sores or rashes. She denies shortness of breath, chest pain, cough, palpitations, or fatigue. Her appetite is healthy and she is staying well hydrated. She does not exercise regularly, but may give it some more thought when the weather improves. A detailed review of systems is otherwise negative.    PAST MEDICAL HISTORY: Past Medical History  Diagnosis Date  . Breast cancer 01/03/14    left, 12 o'clock, upper outer  . Temporal arteritis   . Radiation 03/09/14-03/31/14    Left Breast     PAST SURGICAL HISTORY: Past Surgical History  Procedure Laterality Date  . Tubal ligation      many years ago  . Artery biopsy Right 12/27/2013    Procedure: BIOPSY TEMPORAL ARTERY;  Surgeon: Gayland Curry, MD;  Location: [facility];  Service: General;  Laterality:  Right;  . Breast surgery  01/03/14    breast biopsy    FAMILY HISTORY Family History  Problem Relation Age of Onset  . Diabetes Paternal Uncle   . Alzheimer's disease Mother    the patient's father died at the age of 36, from unclear causes. The patient's mother died at age 60. The patient had 2 brothers, no  sisters. There is no breast or ovarian cancer in the family to her knowledge.  GYNECOLOGIC HISTORY:  Menarche age 74, first live birth age 74, she is Lindsey [name]. She stopped having periods approximately 1983 and did not take hormone replacement. 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 She lives alone at home, with no pets. Her daughter Lindsey [name] lives in [place], where she works for [employer]. Her daughter Lindsey [name] lives in [place], [place], where she works in a hotel industry. The patient has no grandchildren. She is a Tourist information centre manager    ADVANCED DIRECTIVES: Not in place   HEALTH MAINTENANCE: History  Substance Use Topics  . Smoking status: Never Smoker   . Smokeless tobacco: Never Used  . Alcohol Use: No     Comment: rare occaisons glass of wine     Colonoscopy: Never  PAP: not recenty  Bone density: Never  Lipid panel: not recently  Eye exam in 02/2014  No Known Allergies  Current Outpatient Prescriptions  Medication Sig Dispense Refill  . aspirin EC 81 MG tablet Take 162 mg by mouth at bedtime.    . Multiple Vitamins-Minerals (MULTIVITAMIN WITH MINERALS) tablet Take 1 tablet by mouth daily.    . predniSONE (DELTASONE) 5 MG tablet Take 1 tablet (5 mg total) by mouth daily with breakfast. 90 tablet 1  . tamoxifen (NOLVADEX) 20 MG tablet Take 20 mg by mouth daily.     No current facility-administered medications for this visit.    OBJECTIVE: Older white female who appears stated age 74 Vitals:   12/13/14 1326  BP: 138/72  Pulse: 88  Temp: 98.5 F (36.9 C)  Resp: 18     Body mass index is 23 kg/(m^2).      Skin: warm, dry  HEENT: sclerae anicteric, conjunctivae pink, oropharynx clear. No thrush or mucositis.  Lymph Nodes: No cervical or supraclavicular lymphadenopathy  Lungs: clear to auscultation bilaterally, no rales, wheezes, or  rhonci  Heart: regular rate and rhythm  Abdomen: round, soft, non tender, positive bowel sounds  Musculoskeletal: No focal spinal tenderness, no peripheral edema  Neuro: non focal, well oriented, positive affect  Breasts: left breast status post lumpectomy and radiation. No evidence of recurrent disease. Left axilla benign. Right breast unremarkable.  ECOG FS:1 - Symptomatic but completely ambulatory  LAB RESULTS:  CMP     Component Value Date/Time   NA 141 12/13/2014 1304   NA 139 03/02/2014 1403   K 4.5 12/13/2014 1304   K 4.7 03/02/2014 1403   CL 102 03/02/2014 1403   CO2 26 12/13/2014 1304   CO2 31 03/02/2014 1403   GLUCOSE 116 12/13/2014 1304   GLUCOSE 113* 03/02/2014 1403   BUN 16.7 12/13/2014 1304   BUN 14 03/02/2014 1403   CREATININE 0.9 12/13/2014 1304   CREATININE 0.9 03/02/2014 1403   CREATININE 0.68 12/26/2013 1336   CALCIUM 9.1 12/13/2014 1304   CALCIUM 9.7 03/02/2014 1403   PROT 6.9 12/13/2014 1304  PROT 7.3 12/26/2013 1600   ALBUMIN 4.0 12/13/2014 1304   ALBUMIN 3.2* 12/26/2013 1600   AST 20 12/13/2014 1304   AST 20 12/26/2013 1600   ALT 8 12/13/2014 1304   ALT 11 12/26/2013 1600   ALKPHOS 60 12/13/2014 1304   ALKPHOS 92 12/26/2013 1600   BILITOT 0.34 12/13/2014 1304   BILITOT 0.6 12/26/2013 1600   GFRNONAA 83* 12/26/2013 1600   GFRAA >90 12/26/2013 1600    I No results found for: SPEP  Lab Results  Component Value Date   WBC 6.0 12/13/2014   NEUTROABS 4.9 12/13/2014   HGB 12.9 12/13/2014   HCT 40.4 12/13/2014   MCV 92.1 12/13/2014   PLT 241 12/13/2014      Chemistry      Component Value Date/Time   NA 141 12/13/2014 1304   NA 139 03/02/2014 1403   K 4.5 12/13/2014 1304   K 4.7 03/02/2014 1403   CL 102 03/02/2014 1403   CO2 26 12/13/2014 1304   CO2 31 03/02/2014 1403   BUN 16.7 12/13/2014 1304   BUN 14 03/02/2014 1403   CREATININE 0.9 12/13/2014 1304   CREATININE 0.9 03/02/2014 1403   CREATININE 0.68 12/26/2013 1336        Component Value Date/Time   CALCIUM 9.1 12/13/2014 1304   CALCIUM 9.7 03/02/2014 1403   ALKPHOS 60 12/13/2014 1304   ALKPHOS 92 12/26/2013 1600   AST 20 12/13/2014 1304   AST 20 12/26/2013 1600   ALT 8 12/13/2014 1304   ALT 11 12/26/2013 1600   BILITOT 0.34 12/13/2014 1304   BILITOT 0.6 12/26/2013 1600       No results found for: LABCA2  No components found for: LABCA125  No results for input(s): INR in the last 168 hours.  Urinalysis    Component Value Date/Time   COLORURINE YELLOW 12/26/2013 1551   APPEARANCEUR CLEAR 12/26/2013 1551   LABSPEC 1.009 12/26/2013 1551   PHURINE 8.0 12/26/2013 1551   GLUCOSEU NEGATIVE 12/26/2013 1551   HGBUR NEGATIVE 12/26/2013 [place] 12/26/2013 1551   BILIRUBINUR neg 12/26/2013 1343   KETONESUR 15* 12/26/2013 1551   PROTEINUR NEGATIVE 12/26/2013 1551   PROTEINUR 30 12/26/2013 1343   UROBILINOGEN 1.0 12/26/2013 1551   UROBILINOGEN 1.0 12/26/2013 1343   NITRITE NEGATIVE 12/26/2013 1551   NITRITE neg 12/26/2013 1343   LEUKOCYTESUR NEGATIVE 12/26/2013 1551    STUDIES: No results found. Diagnostic mammogram due this month.   ASSESSMENT: 74 y.o. [place] woman status post left breast excisional biopsy 01/30/2014 for a pT1c cN0, stage IA invasive ductal (papillary) carcinoma, estrogen receptor 100% positive, progesterone receptor 11% positive, with an MIB-1 of 8% and no HER-2 amplification.  1. Camarie underwent radiation from 03/09/2014 through 03/31/2014  2.  She was started on Tamoxifen daily on 04/06/2014.  PLAN: Danielly looks and feels well today. She is tolerating the tamoxifen well and will continue this drug for 5-10 years of antiestrogen therapy as tolerated.   We spent some time discussing her need for a mammogram. The only one she has ever had was after her breast cancer was discovered. She did not know she would still need one yearly. She believes her last one was in February at [facility], so I have placed orders  for this to be repeated this month at the same place.   Angeleena will return in 6 month for labs and an a follow up visit. She understands and agrees with this plan. She knows the goal  of treatment in her case is cure. She has been encouraged to call with any issues that might arise before her next visit here.   Marcelino Duster, [place] [phone] 12/13/2014 2:15 PM

## 2014-12-13 NOTE — Telephone Encounter (Signed)
per pof tosch pt appt-cld [facility] sch pt appt-gave pt appt time & date-pt understood

## 2015-01-02 LAB — HM MAMMOGRAPHY
HM MAMMO: NORMAL
HM Mammogram: NORMAL

## 2015-01-17 ENCOUNTER — Other Ambulatory Visit: Payer: Self-pay | Admitting: Internal Medicine

## 2015-03-19 ENCOUNTER — Other Ambulatory Visit: Payer: Self-pay | Admitting: *Deleted

## 2015-03-19 MED ORDER — TAMOXIFEN CITRATE 20 MG PO TABS: 20.0000 mg | ORAL_TABLET | Freq: Every day | ORAL | Status: DC

## 2015-03-21 ENCOUNTER — Telehealth: Payer: Self-pay | Admitting: Oncology

## 2015-03-21 NOTE — Telephone Encounter (Signed)
Left message to confirmed change for Aug. Mailed calenda

## 2015-04-02 ENCOUNTER — Encounter: Payer: Self-pay | Admitting: Internal Medicine

## 2015-04-02 ENCOUNTER — Other Ambulatory Visit (INDEPENDENT_AMBULATORY_CARE_PROVIDER_SITE_OTHER): Payer: Medicare HMO

## 2015-04-02 ENCOUNTER — Ambulatory Visit (INDEPENDENT_AMBULATORY_CARE_PROVIDER_SITE_OTHER): Payer: Medicare HMO | Admitting: Internal Medicine

## 2015-04-02 VITALS — BP 112/72 | HR 93 | Temp 98.8°F | Wt 140.8 lb

## 2015-04-02 DIAGNOSIS — E785 Hyperlipidemia, unspecified: Secondary | ICD-10-CM

## 2015-04-02 DIAGNOSIS — M316 Other giant cell arteritis: Secondary | ICD-10-CM | POA: Diagnosis not present

## 2015-04-02 DIAGNOSIS — R739 Hyperglycemia, unspecified: Secondary | ICD-10-CM

## 2015-04-02 LAB — COMPREHENSIVE METABOLIC PANEL
ALK PHOS: 61 U/L (ref 39–117)
ALT: 11 U/L (ref 0–35)
AST: 21 U/L (ref 0–37)
Albumin: 4.3 g/dL (ref 3.5–5.2)
BUN: 16 mg/dL (ref 6–23)
CO2: 29 meq/L (ref 19–32)
Calcium: 9.3 mg/dL (ref 8.4–10.5)
Chloride: 103 mEq/L (ref 96–112)
Creatinine, Ser: 0.88 mg/dL (ref 0.40–1.20)
GFR: 66.67 mL/min (ref >60.00)
Glucose, Bld: 108 mg/dL — ABNORMAL HIGH (ref 70–99)
Potassium: 4.4 mEq/L (ref 3.5–5.1)
SODIUM: 137 meq/L (ref 135–145)
Total Bilirubin: 0.4 mg/dL (ref 0.2–1.2)
Total Protein: 6.9 g/dL (ref 6.0–8.3)

## 2015-04-02 LAB — CBC WITH DIFFERENTIAL/PLATELET
BASOS PCT: 0.4 % (ref 0.0–3.0)
Basophils Absolute: 0 10*3/uL (ref 0.0–0.1)
Eosinophils Absolute: 0 10*3/uL (ref 0.0–0.7)
Eosinophils Relative: 0.1 % (ref 0.0–5.0)
HCT: 39.9 % (ref 36.0–46.0)
HEMOGLOBIN: 13.5 g/dL (ref 12.0–15.0)
Lymphocytes Relative: 17 % (ref 12.0–46.0)
Lymphs Abs: 0.6 10*3/uL — ABNORMAL LOW (ref 0.7–4.0)
MCHC: 33.8 g/dL (ref 30.0–36.0)
MCV: 88.7 fl (ref 78.0–100.0)
MONO ABS: 0.4 10*3/uL (ref 0.1–1.0)
MONOS PCT: 11 % (ref 3.0–12.0)
NEUTROS ABS: 2.7 10*3/uL (ref 1.4–7.7)
Neutrophils Relative %: 71.5 % (ref 43.0–77.0)
PLATELETS: 221 10*3/uL (ref 150.0–400.0)
RBC: 4.5 Mil/uL (ref 3.87–5.11)
RDW: 13.7 % (ref 11.5–15.5)
WBC: 3.8 10*3/uL — AB (ref 4.0–10.5)

## 2015-04-02 LAB — LIPID PANEL
CHOL/HDL RATIO: 3
Cholesterol: 175 mg/dL (ref 0–200)
HDL: 59.7 mg/dL (ref >39.00)
LDL CALC: 90 mg/dL (ref 0–99)
NonHDL: 115.3
Triglycerides: 125 mg/dL (ref 0.0–149.0)
VLDL: 25 mg/dL (ref 0.0–40.0)

## 2015-04-02 LAB — SEDIMENTATION RATE: SED RATE: 7 mm/h (ref 0–22)

## 2015-04-02 LAB — TSH: TSH: 1.2 u[IU]/mL (ref 0.35–4.50)

## 2015-04-02 LAB — HEMOGLOBIN A1C: HEMOGLOBIN A1C: 5.9 % (ref 4.6–6.5)

## 2015-04-02 MED ORDER — PREDNISONE 2.5 MG PO TABS: 2.5000 mg | ORAL_TABLET | Freq: Every day | ORAL | Status: DC

## 2015-04-02 NOTE — Patient Instructions (Signed)
Temporal Arteritis Arteries are blood vessels that carry blood from the heart to all parts of the body. Temporal arteritis is a swelling (inflammation) of certain large arteries. This usually affects arteries in the head and neck area, including arteries in the area on the side of the head, between the ears and eyes (temples). The condition can be very painful. It also can cause serious problems, even blindness. Early diagnosis and treatment is very important. CAUSES  Temporal arteritis results from the body reacting to injury or infection (inflammation). This may occur when the body's immune system (which fights germs and disease) makes a mistake. It attacks its own arteries. No one knows why this happens. However, certain things (risk factors) make it more likely that a person will develop temporal arteritis. They include:  Age. Most people with temporal arteritis are older than 50. The average age is 70.  Sex. Three times more women than men develop the condition.  Race and ethnic background. Caucasians are more likely to have temporal arteritis than other races. So are people whose families came from [place] ([place], [place], [place] or [place]).  Having polymyalgia rheumatica (PMR). This condition causes stiffness and pain in the joints of the neck, shoulders and hips. About 15% of people with PMR also have temporal arteritis. SYMPTOMS  Not everyone with temporal arteritis has the same symptoms. Some people have just one symptom. Others may have several. The most common symptom is a new headache, often in the temple region. Symptoms may show up in other parts of the body too.   Symptoms affecting the head may include:  Temporal arteries that feel hard or swollen. It may hurt when the temples are touched.  Pain when combing your hair, or when laying your head on a pillow.  Pain in the jaw when chewing.  Pain in the throat or tongue.  Visual problems, including sudden loss of  vision in one eye, or seeing double.  Symptoms in other parts of the body may include:  Fever.  Fatigue.  A dry cough.  Pain in the hips and shoulders.  Pain in the arms during exercise.  Depression.  Weight loss. DIAGNOSIS  Symptoms of temporal arteritis are similar to symptoms for other conditions. This can make it hard to tell if you have the condition. To be sure, your caregiver will ask about your symptoms and do a physical exam. Certain tests may be necessary, such as:   An exam of your temples. Often, the temporal arteries will be swollen and hard. This can be felt.  A complete blood count. This test shows how many red blood cells are in your blood. Most people with temporal arteritis do not have enough red blood cells (anemic).  Erythrocyte sedimentation (also called sed rate test). It measures inflammation in the body. Almost everyone with temporal arteritis has a high sed rate.  C reactive protein test. This also shows if there is inflammation.  Biopsy a temporal artery. This means the caregiver will take out a small piece of an artery. Then, it is checked under a microscope for inflammation. More than one biopsy may be needed. That is because inflammation can be in one part of an artery and not in others. Your caregiver may need to check more than one spot. TREATMENT  Starting treatment right away is very important. Often, you will need to see a specialist in immunologic diseases (rheumatologist). Goals of treatment include protecting your eyesight. Once vision is gone, it might not come   back. The normal treatment is medication. It usually works well and quickly. Most people start getting better in a few days. Medication options include:  Corticosteroids. These are powerful drugs that fight inflammation. These drugs are most often used to treat temporal arteritis.  Usually, a high dose is taken at first. After symptoms improve, a smaller dose is used. The goal is to take  the smallest dose possible and still control your symptoms. That is because using corticosteroids for a long time can cause problems. They can make muscles and bones weak. They can cause blood pressure to go up, and cause diabetes. Also, people often gain weight when they take corticosteroids. Corticosteroids may need to be taken for one or two years.  Several newer drugs are being tested to treat temporal arteritis. Researchers are testing to see if new drugs will work as well as corticosteroids, but cause fewer problems than them. Testing of these drugs is not yet complete.  Some specialists recommend low dose aspirin to prevent blood clots. HOME CARE INSTRUCTIONS   Take any corticosteroids that your caregiver prescribes. Follow the directions carefully.  Take any vitamins or supplements that your caregiver suggests. This may include vitamin D and calcium. They help keep your bones from becoming weak.  Keep all appointments for checkups. Your caregiver will watch for any problems from the medication. Checkups may include:  Periodic blood tests.  Bone density testing. This checks how strong or weak your bones are.  Blood pressure checks. If your blood pressure rises, you may need to take a drug to control it while you are taking corticosteroids.  Blood sugar checks. This is to be sure you are not developing diabetes. If you have diabetes, corticosteroid medications may make it worse and require increased treatment.  Exercise. First, talk with your caregiver about what would be OK for you to do. Aerobic exercise (which increases your heart rate) is usually suggested. It does not have to require a lot of energy. Walking is aerobic exercise. This type of exercise is good because it helps prevent bone loss. It also helps control your blood pressure.  Follow a healthy diet. The goal is to prevent bone damage and diabetes. Include good sources of protein in your diet. Also, include fruits,  vegetables and whole grains. Your caregiver can refer you to an expert on healthy eating (dietitian) for more advice. SEEK MEDICAL CARE IF:   The symptoms that lead to your diagnosis return.  You develop worsening fever, fatigue, headache, weight loss, or pain in your jaw.  You develop signs of infection. Infections can be worse if you are on corticosteroid medication. SEEK IMMEDIATE MEDICAL CARE IF:   Your eyesight changes.  Pain does not go away, even after taking pain medicine.  You feel pain in your chest.  Breathing is difficult.  One side of your face or body suddenly becomes weak or numb.  You develop a fever of more than 102 F (38.9 C). Document Released: 08/24/2009 Document Revised: 01/19/2012 Document Reviewed: 12/21/2013 ExitCare Patient Information 2015 [employer]. This information is not intended to replace advice given to you by your health care provider. Make sure you discuss any questions you have with your health care provider.  

## 2015-04-03 ENCOUNTER — Encounter: Payer: Self-pay | Admitting: Internal Medicine

## 2015-04-03 NOTE — Progress Notes (Signed)
Subjective:  Patient ID: Lindsey [name], female    DOB: 04-07-1941  Age: 74 y.o. MRN: [id number]  CC: Medical Clearance   HPI Lindsey [name] presents for a letter to clear her for cataract eye surgery, she feels well and offers no complaints. She is ready to lower the prednisone dose.  Outpatient Prescriptions Prior to Visit  Medication Sig Dispense Refill  . aspirin EC 81 MG tablet Take 162 mg by mouth at bedtime.    . Multiple Vitamins-Minerals (MULTIVITAMIN WITH MINERALS) tablet Take 1 tablet by mouth daily.    . tamoxifen (NOLVADEX) 20 MG tablet Take 1 tablet (20 mg total) by mouth daily. 30 tablet 3  . predniSONE (DELTASONE) 5 MG tablet TAKE 1 TABLET BY MOUTH DAILY WITH BREAKFAST 90 tablet 0   No facility-administered medications prior to visit.    ROS Review of Systems  Constitutional: Negative.  Negative for fever, chills, diaphoresis, appetite change and fatigue.  HENT: Negative.  Negative for sore throat, trouble swallowing and voice change.   Eyes: Positive for visual disturbance ("cloudy" vision in her right eye). Negative for photophobia, pain, redness and itching.  Respiratory: Negative.  Negative for cough, choking, chest tightness, shortness of breath and stridor.   Cardiovascular: Negative.  Negative for chest pain, palpitations and leg swelling.  Gastrointestinal: Negative.  Negative for abdominal pain.  Endocrine: Negative.  Negative for polydipsia, polyphagia and polyuria.  Genitourinary: Negative.   Musculoskeletal: Negative.   Skin: Negative.   Allergic/Immunologic: Negative.   Neurological: Negative.  Negative for dizziness, tremors, seizures, syncope, speech difficulty, light-headedness, numbness and headaches.  Hematological: Negative.  Negative for adenopathy. Does not bruise/bleed easily.  Psychiatric/Behavioral: Negative.  Negative for sleep disturbance, dysphoric mood and decreased concentration. The patient is not nervous/anxious.     Objective:    BP 112/72 mmHg  Pulse 93  Temp(Src) 98.8 F (37.1 C) (Oral)  Wt 140 lb 12.8 oz (63.866 kg)  SpO2 97%  BP Readings from Last 3 Encounters:  04/02/15 112/72  12/13/14 138/72  08/23/14 132/62    Wt Readings from Last 3 Encounters:  04/02/15 140 lb 12.8 oz (63.866 kg)  12/13/14 142 lb 6.7 oz (64.6 kg)  08/23/14 143 lb (64.864 kg)    Physical Exam  Constitutional: She is oriented to person, place, and time. She appears well-developed and well-nourished. No distress.  HENT:  Head: Normocephalic and atraumatic.  Nose: Nose normal.  Mouth/Throat: Oropharynx is clear and moist. No oropharyngeal exudate.  Eyes: Conjunctivae and EOM are normal. Pupils are equal, round, and reactive to light. Right eye exhibits no discharge. Left eye exhibits no discharge. No scleral icterus.  Neck: Normal range of motion. Neck supple. No JVD present. No tracheal deviation present. No thyromegaly present.  Cardiovascular: Normal rate, regular rhythm, normal heart sounds and intact distal pulses.  Exam reveals no gallop and no friction rub.   No murmur heard. Pulmonary/Chest: Effort normal and breath sounds normal. No stridor. No respiratory distress. She has no wheezes. She has no rales. She exhibits no tenderness.  Abdominal: Soft. Bowel sounds are normal. She exhibits no distension and no mass. There is no tenderness. There is no rebound and no guarding.  Musculoskeletal: Normal range of motion. She exhibits no edema or tenderness.  Lymphadenopathy:    She has no cervical adenopathy.  Neurological: She is oriented to person, place, and time.  Skin: Skin is warm and dry. No rash noted. She is not diaphoretic. No erythema. No pallor.  Psychiatric:  She has a normal mood and affect. Judgment and thought content normal.  Vitals reviewed.   Lab Results  Component Value Date   WBC 3.8* 04/02/2015   HGB 13.5 04/02/2015   HCT 39.9 04/02/2015   PLT 221.0 04/02/2015   GLUCOSE 108* 04/02/2015   CHOL 175  04/02/2015   TRIG 125.0 04/02/2015   HDL 59.70 04/02/2015   LDLCALC 90 04/02/2015   ALT 11 04/02/2015   AST 21 04/02/2015   NA 137 04/02/2015   K 4.4 04/02/2015   CL 103 04/02/2015   CREATININE 0.88 04/02/2015   BUN 16 04/02/2015   CO2 29 04/02/2015   TSH 1.20 04/02/2015   HGBA1C 5.9 04/02/2015    No results found.  Assessment & Plan:   Jaquesha was seen today for medical clearance.  Diagnoses and all orders for this visit:  Hyperlipidemia with target LDL less than 160 - Framingham risk score is 4% she does not need to start a statin Orders: -     Lipid panel; Future -     Comprehensive metabolic panel; Future -     CBC with Differential/Platelet; Future -     TSH; Future  Hyperglycemia Orders: -     Comprehensive metabolic panel; Future -     Hemoglobin A1c; Future  Temporal arteritis she is doing well wrt this, will lower the prednisone dose Orders: -     Comprehensive metabolic panel; Future -     CBC with Differential/Platelet; Future -     Sedimentation rate; Future -     predniSONE (DELTASONE) 2.5 MG tablet; Take 1 tablet (2.5 mg total) by mouth daily with breakfast.   I have discontinued Lindsey [name]'s predniSONE. I am also having her start on predniSONE. Additionally, I am having her maintain her aspirin EC, multivitamin with minerals, tamoxifen, and LUMIGAN.  Meds ordered this encounter  Medications  . LUMIGAN 0.01 % SOLN    Sig: 1 drop in both eyes daily    Refill:  1  . predniSONE (DELTASONE) 2.5 MG tablet    Sig: Take 1 tablet (2.5 mg total) by mouth daily with breakfast.    Dispense:  30 tablet    Refill:  2     Follow-up: Return in about 4 weeks (around 04/30/2015).  Scarlette Calico, MD

## 2015-06-14 ENCOUNTER — Other Ambulatory Visit: Payer: Medicare HMO

## 2015-06-14 ENCOUNTER — Ambulatory Visit: Payer: Medicare HMO | Admitting: Nurse Practitioner

## 2015-07-10 ENCOUNTER — Other Ambulatory Visit: Payer: Medicare HMO

## 2015-07-10 ENCOUNTER — Ambulatory Visit: Payer: Medicare HMO | Admitting: Nurse Practitioner

## 2015-07-11 ENCOUNTER — Other Ambulatory Visit (HOSPITAL_BASED_OUTPATIENT_CLINIC_OR_DEPARTMENT_OTHER): Payer: Medicare HMO

## 2015-07-11 ENCOUNTER — Encounter: Payer: Self-pay | Admitting: Nurse Practitioner

## 2015-07-11 ENCOUNTER — Telehealth: Payer: Self-pay | Admitting: Oncology

## 2015-07-11 ENCOUNTER — Ambulatory Visit (HOSPITAL_BASED_OUTPATIENT_CLINIC_OR_DEPARTMENT_OTHER): Payer: Medicare HMO | Admitting: Nurse Practitioner

## 2015-07-11 VITALS — BP 148/86 | HR 91 | Temp 98.1°F | Resp 18 | Ht 66.0 in | Wt 138.6 lb

## 2015-07-11 DIAGNOSIS — M316 Other giant cell arteritis: Secondary | ICD-10-CM

## 2015-07-11 DIAGNOSIS — C50412 Malignant neoplasm of upper-outer quadrant of left female breast: Secondary | ICD-10-CM

## 2015-07-11 DIAGNOSIS — Z79810 Long term (current) use of selective estrogen receptor modulators (SERMs): Secondary | ICD-10-CM | POA: Diagnosis not present

## 2015-07-11 DIAGNOSIS — Z170 Estrogen receptor positive status [ER+]: Secondary | ICD-10-CM

## 2015-07-11 LAB — COMPREHENSIVE METABOLIC PANEL (CC13)
ALBUMIN: 3.5 g/dL (ref 3.5–5.0)
ALK PHOS: 89 U/L (ref 40–150)
ALT: 13 U/L (ref 0–55)
ANION GAP: 7 meq/L (ref 3–11)
AST: 20 U/L (ref 5–34)
BUN: 11.2 mg/dL (ref 7.0–26.0)
CALCIUM: 9.4 mg/dL (ref 8.4–10.4)
CHLORIDE: 105 meq/L (ref 98–109)
CO2: 28 mEq/L (ref 22–29)
Creatinine: 0.8 mg/dL (ref 0.6–1.1)
EGFR: 69 mL/min/{1.73_m2} — ABNORMAL LOW (ref >90)
Glucose: 110 mg/dl (ref 70–140)
POTASSIUM: 4.5 meq/L (ref 3.5–5.1)
Sodium: 140 mEq/L (ref 136–145)
Total Bilirubin: 0.41 mg/dL (ref 0.20–1.20)
Total Protein: 6.6 g/dL (ref 6.4–8.3)

## 2015-07-11 LAB — CBC WITH DIFFERENTIAL/PLATELET
BASO%: 0.8 % (ref 0.0–2.0)
BASOS ABS: 0 10*3/uL (ref 0.0–0.1)
EOS ABS: 0.1 10*3/uL (ref 0.0–0.5)
EOS%: 1.9 % (ref 0.0–7.0)
HEMATOCRIT: 36.4 % (ref 34.8–46.6)
HEMOGLOBIN: 12.1 g/dL (ref 11.6–15.9)
LYMPH#: 0.9 10*3/uL (ref 0.9–3.3)
LYMPH%: 18.9 % (ref 14.0–49.7)
MCH: 29.8 pg (ref 25.1–34.0)
MCHC: 33.2 g/dL (ref 31.5–36.0)
MCV: 89.7 fL (ref 79.5–101.0)
MONO#: 0.6 10*3/uL (ref 0.1–0.9)
MONO%: 13 % (ref 0.0–14.0)
NEUT#: 3.1 10*3/uL (ref 1.5–6.5)
NEUT%: 65.4 % (ref 38.4–76.8)
PLATELETS: 257 10*3/uL (ref 145–400)
RBC: 4.06 10*6/uL (ref 3.70–5.45)
RDW: 13.2 % (ref 11.2–14.5)
WBC: 4.8 10*3/uL (ref 3.9–10.3)

## 2015-07-11 NOTE — Telephone Encounter (Signed)
Gave avs & calendar for March 2017. °

## 2015-07-11 NOTE — Progress Notes (Signed)
[facility]  [phone] [phone]) [phone]     ID: Lindsey [name] OB: [birth year]  MR#: [id number]  BTD#:[id number]  PCP: Scarlette Calico, MD GYN:   SU: Excell Seltzer OTHER MD: [place] breast cancer CURRENT TREATMENT: tamoxifen daily  BREAST CANCER HISTORY: Lindsey [name] noted a mass in her left breast sometime in November. She thought about it but decided to do nothing and specifically she did not bring it to her primary care physician's attention. More recently, in February, she started having systemic symptoms including weight loss, drenching sweats, and overall weakness. She presented to the emergency room with these symptoms and was referred to Dr. Nena Jordan for evaluation. He noted a bilateral temporal artery prominence and tenderness and set the patient up for Temporal artery biopsy, performed to 17 2059 showing ([id number]) giant cell arteritis. She was started on prednisone and her symptoms have considerably improved. She is now on a taper.  As part of her evaluation she was noted to have a left breast mass and was referred to [facility] for evaluation. Ultrasound of the left breast mass to 20 02/27/2014 showed it to be largely cystic and it was aspirated the same day. The pathology ([id number] [id number]) showed this aggregated fragments of a papillary lesion without cytologic atypia. Accordingly the patient was referred to Dr. Excell Seltzer for excisional biopsy. This was performed 01/30/2014, and showed ([id number] an invasive ductal carcinoma with papillary features (invasive papillary carcinoma), grade 1, measuring 1.5 cm. Margins were negative but less than a millimeter there was no evidence of lymphovascular invasion. The cells were estrogen receptor 100% positive, progesterone receptor 11% positive, with an MIB-1 of 8% and no HER-2 amplification, the signals ratio being 1.56 and a copy number per cell 3.90.  The patient's case was presented at the  multidisciplinary breast cancer conference for age 74. The consensus at that time was that the patient would not need chemotherapy and also would not need sentinel lymph node biopsy as it would not change treatment.  The patient's subsequent history is as detailed below.   INTERVAL HISTORY: Lindsey [name] returns today for follow up and evaluation of her breast cancer. She has been on tamoxifen since May 2015 and continues to tolerate this well. She has some mild vaginal discharge and rare hot flashes. The interval history is remarkable for tapering off high dose prednisone 2 weeks ago. This was done by her PCP Dr. Ronnald Ramp, who had been treating her for temporal arteritis. Since this time she has noticed a moderate increase in her joints, including her shoulders, hips, and thighs -- all common sites of joint pain in those with her condition. She has not taken anything OTC to relieve the pain.  REVIEW OF SYSTEMS: Lindsey [name] denies fevers, chills, nausea, vomiting, or changes in bowel or bladder habits. She has some shortness of breath with exertion, but denies chest pain, cough, palpitations, or fatigue. Her appetite is healthy and she is staying well hydrated. She denies headaches, dizziness, or vision changes. She still has not found the motivation to begin exercising. A detailed review of systems is otherwise negative.    PAST MEDICAL HISTORY: Past Medical History  Diagnosis Date  . Breast cancer 01/03/14    left, 12 o'clock, upper outer  . Temporal arteritis   . Radiation 03/09/14-03/31/14    Left Breast     PAST SURGICAL HISTORY: Past Surgical History  Procedure Laterality Date  . Tubal ligation      many  years ago  . Artery biopsy Right 12/27/2013    Procedure: BIOPSY TEMPORAL ARTERY;  Surgeon: Gayland Curry, MD;  Location: [facility];  Service: General;  Laterality: Right;  . Breast surgery  01/03/14    breast biopsy    FAMILY HISTORY Family History  Problem Relation Age of Onset  . Diabetes Paternal  Uncle   . Alzheimer's disease Mother    the patient's father died at the age of 69, from unclear causes. The patient's mother died at age 68. The patient had 2 brothers, no sisters. There is no breast or ovarian cancer in the family to her knowledge.  GYNECOLOGIC HISTORY:  Menarche age 59, first live birth age 80, she is [place] P2. She stopped having periods approximately 1983 and did not take hormone replacement. 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 She lives alone at home, with no pets. Her daughter Jonnie Finner lives in [place], where she works for [employer]. Her daughter Claris Gower lives in [place], [place], where she works in a hotel industry. The patient has no grandchildren. She is a Tourist information centre manager    ADVANCED DIRECTIVES: Not in place   HEALTH MAINTENANCE: Social History  Substance Use Topics  . Smoking status: Never Smoker   . Smokeless tobacco: Never Used  . Alcohol Use: No     Comment: rare occaisons glass of wine     Colonoscopy: Never  PAP: not recenty  Bone density: Never  Lipid panel: not recently  Eye exam in 02/2014  No Known Allergies  Current Outpatient Prescriptions  Medication Sig Dispense Refill  . aspirin EC 81 MG tablet Take 162 mg by mouth at bedtime.    Marland Kitchen BESIVANCE 0.6 % SUSP   0  . LUMIGAN 0.01 % SOLN Place 1 drop into both eyes at bedtime. 1 drop in both eyes daily  1  . Multiple Vitamins-Minerals (MULTIVITAMIN WITH MINERALS) tablet Take 1 tablet by mouth daily.    Marland Kitchen PROLENSA 0.07 % SOLN Place 1 drop into the left eye daily.   0  . tamoxifen (NOLVADEX) 20 MG tablet Take 1 tablet (20 mg total) by mouth daily. 30 tablet 3  . predniSONE (DELTASONE) 2.5 MG tablet Take 1 tablet (2.5 mg total) by mouth daily with breakfast. (Patient not taking: Reported on 07/11/2015) 30 tablet 2   No current facility-administered medications for  this visit.    OBJECTIVE: Older white female who appears stated age 74 Vitals:   07/11/15 1127  BP: 148/86  Pulse: 91  Temp: 98.1 F (36.7 C)  Resp: 18     Body mass index is 22.38 kg/(m^2).      Skin: warm, dry  HEENT: sclerae anicteric, conjunctivae pink, oropharynx clear. No thrush or mucositis.  Lymph Nodes: No cervical or supraclavicular lymphadenopathy  Lungs: clear to auscultation bilaterally, no rales, wheezes, or rhonci  Heart: regular rate and rhythm  Abdomen: round, soft, non tender, positive bowel sounds  Musculoskeletal: No focal spinal tenderness, no peripheral edema  Neuro: non focal, well oriented, positive affect  Breasts: left breast status post lumpectomy and radiation. No evidence of recurrent disease. Left axilla benign. Right breast unremarkable.  ECOG FS:1 - Symptomatic but completely ambulatory  LAB RESULTS:  CMP     Component Value Date/Time   NA 140 07/11/2015 1103   NA 137 04/02/2015 1633   K  4.5 07/11/2015 1103   K 4.4 04/02/2015 1633   CL 103 04/02/2015 1633   CO2 28 07/11/2015 1103   CO2 29 04/02/2015 1633   GLUCOSE 110 07/11/2015 1103   GLUCOSE 108* 04/02/2015 1633   BUN 11.2 07/11/2015 1103   BUN 16 04/02/2015 1633   CREATININE 0.8 07/11/2015 1103   CREATININE 0.88 04/02/2015 1633   CREATININE 0.68 12/26/2013 1336   CALCIUM 9.4 07/11/2015 1103   CALCIUM 9.3 04/02/2015 1633   PROT 6.6 07/11/2015 1103   PROT 6.9 04/02/2015 1633   ALBUMIN 3.5 07/11/2015 1103   ALBUMIN 4.3 04/02/2015 1633   AST 20 07/11/2015 1103   AST 21 04/02/2015 1633   ALT 13 07/11/2015 1103   ALT 11 04/02/2015 1633   ALKPHOS 89 07/11/2015 1103   ALKPHOS 61 04/02/2015 1633   BILITOT 0.41 07/11/2015 1103   BILITOT 0.4 04/02/2015 1633   GFRNONAA 83* 12/26/2013 1600   GFRAA >90 12/26/2013 1600    I No results found for: SPEP  Lab Results  Component Value Date   WBC 4.8 07/11/2015   NEUTROABS 3.1 07/11/2015   HGB 12.1 07/11/2015   HCT 36.4 07/11/2015    MCV 89.7 07/11/2015   PLT 257 07/11/2015      Chemistry      Component Value Date/Time   NA 140 07/11/2015 1103   NA 137 04/02/2015 1633   K 4.5 07/11/2015 1103   K 4.4 04/02/2015 1633   CL 103 04/02/2015 1633   CO2 28 07/11/2015 1103   CO2 29 04/02/2015 1633   BUN 11.2 07/11/2015 1103   BUN 16 04/02/2015 1633   CREATININE 0.8 07/11/2015 1103   CREATININE 0.88 04/02/2015 1633   CREATININE 0.68 12/26/2013 1336      Component Value Date/Time   CALCIUM 9.4 07/11/2015 1103   CALCIUM 9.3 04/02/2015 1633   ALKPHOS 89 07/11/2015 1103   ALKPHOS 61 04/02/2015 1633   AST 20 07/11/2015 1103   AST 21 04/02/2015 1633   ALT 13 07/11/2015 1103   ALT 11 04/02/2015 1633   BILITOT 0.41 07/11/2015 1103   BILITOT 0.4 04/02/2015 1633       No results found for: LABCA2  No components found for: LABCA125  No results for input(s): INR in the last 168 hours.  Urinalysis    Component Value Date/Time   COLORURINE YELLOW 12/26/2013 1551   APPEARANCEUR CLEAR 12/26/2013 1551   LABSPEC 1.009 12/26/2013 1551   PHURINE 8.0 12/26/2013 1551   GLUCOSEU NEGATIVE 12/26/2013 1551   HGBUR NEGATIVE 12/26/2013 [place] 12/26/2013 1551   BILIRUBINUR neg 12/26/2013 1343   KETONESUR 15* 12/26/2013 1551   PROTEINUR NEGATIVE 12/26/2013 1551   PROTEINUR 30 12/26/2013 1343   UROBILINOGEN 1.0 12/26/2013 1551   UROBILINOGEN 1.0 12/26/2013 1343   NITRITE NEGATIVE 12/26/2013 1551   NITRITE neg 12/26/2013 1343   LEUKOCYTESUR NEGATIVE 12/26/2013 1551    STUDIES: No results found. Most recent mammogram at [facility] on 01/02/15 was unremarkable.  ASSESSMENT: 74 y.o. Lindsey [name] woman status post left breast excisional biopsy 01/30/2014 for a pT1c cN0, stage IA invasive ductal (papillary) carcinoma, estrogen receptor 100% positive, progesterone receptor 11% positive, with an MIB-1 of 8% and no HER-2 amplification.  1. Lindsey [name] underwent radiation from 03/09/2014 through 03/31/2014  2.  She was  started on Tamoxifen daily on 04/06/2014.  PLAN: Lindsey [name] is doing well as far as her breast cancer is concerned. Her most recent mammogram was negative. Her labs were entirely normal.  She will continue on the tamoxifen at least 1 more year before giving consideration to aromatase inhibitor therapy. So far she tolerates the tamoxifen well with minor hot flashes and vaginal wetness.   I encouraged her to reach out to her PCP regarding the uptick in her joint pain and stiffness since stopping the steroids 2 weeks ago. This happens to people regularly when stopping steroids, but given her history of temporal arteritis and the location of her pains, more attention may need to be paid to this issue. I advised she try aleve or advil in the meantime.  Genee will have a repeat mammogram at [facility] in late August, then follow up shortly after that for a 6 month follow up visit with Dr. Jana Hakim. She understands and agrees with this plan. She knows the goal of treatment in her case is cure. She has been encouraged to call with any issues that might arise before her next visit here.  Laurie Panda, NP  07/11/2015 12:20 PM

## 2015-08-11 DIAGNOSIS — Z23 Encounter for immunization: Secondary | ICD-10-CM | POA: Diagnosis not present

## 2015-08-28 ENCOUNTER — Encounter: Payer: Self-pay | Admitting: Internal Medicine

## 2015-08-28 ENCOUNTER — Ambulatory Visit (INDEPENDENT_AMBULATORY_CARE_PROVIDER_SITE_OTHER): Payer: Medicare HMO | Admitting: Internal Medicine

## 2015-08-28 VITALS — BP 130/80 | HR 96 | Temp 98.3°F | Resp 16 | Ht 66.0 in | Wt 137.0 lb

## 2015-08-28 DIAGNOSIS — M316 Other giant cell arteritis: Secondary | ICD-10-CM

## 2015-08-28 DIAGNOSIS — M353 Polymyalgia rheumatica: Secondary | ICD-10-CM | POA: Diagnosis not present

## 2015-08-28 DIAGNOSIS — H8110 Benign paroxysmal vertigo, unspecified ear: Secondary | ICD-10-CM | POA: Diagnosis not present

## 2015-08-28 DIAGNOSIS — Z23 Encounter for immunization: Secondary | ICD-10-CM | POA: Diagnosis not present

## 2015-08-28 MED ORDER — PREDNISONE 10 MG PO TABS: 10.0000 mg | ORAL_TABLET | Freq: Every day | ORAL | Status: DC

## 2015-08-28 NOTE — Progress Notes (Signed)
Subjective:  Patient ID: Lindsey [name], female    DOB: 10-Jun-1941  Age: 74 y.o. MRN: [id number]  CC: Follow-up   HPI EMBERLY TOMASSO presents for follow-up on temporal arteritis and polymyalgia rheumatica. She has been on low-dose prednisone for nearly 2 years. She decided 2 months ago to abruptly stop taking prednisone and since then her symptoms have returned. For the last 3-4 weeks she complains of intermittent episodes of vertigo and achy muscles throughout her arms, legs, thighs, neck and shoulders. The muscle aches increase at night. She wants to restart prednisone. She complains that the vertigo only occurs when she is laying down in the bed and moving her head. She denies any other neurological symptoms.  Outpatient Prescriptions Prior to Visit  Medication Sig Dispense Refill  . aspirin EC 81 MG tablet Take 162 mg by mouth at bedtime.    Marland Kitchen LUMIGAN 0.01 % SOLN Place 1 drop into both eyes at bedtime. 1 drop in both eyes daily  1  . Multiple Vitamins-Minerals (MULTIVITAMIN WITH MINERALS) tablet Take 1 tablet by mouth daily.    . tamoxifen (NOLVADEX) 20 MG tablet Take 1 tablet (20 mg total) by mouth daily. 30 tablet 3  . BESIVANCE 0.6 % SUSP   0  . predniSONE (DELTASONE) 2.5 MG tablet Take 1 tablet (2.5 mg total) by mouth daily with breakfast. 30 tablet 2  . PROLENSA 0.07 % SOLN Place 1 drop into the left eye daily.   0   No facility-administered medications prior to visit.    ROS Review of Systems  Constitutional: Negative.  Negative for fever, chills, diaphoresis, appetite change and fatigue.  HENT: Negative.  Negative for trouble swallowing and voice change.   Eyes: Negative.   Respiratory: Negative.  Negative for cough, choking, chest tightness, shortness of breath and stridor.   Cardiovascular: Negative.  Negative for chest pain, palpitations and leg swelling.  Gastrointestinal: Negative.  Negative for nausea, vomiting, abdominal pain, diarrhea, constipation and blood in  stool.  Endocrine: Negative.   Genitourinary: Negative.   Musculoskeletal: Positive for myalgias and neck pain. Negative for back pain, joint swelling, arthralgias, gait problem and neck stiffness.  Skin: Negative.   Allergic/Immunologic: Negative.   Neurological: Negative.  Negative for dizziness, tremors, seizures, syncope, facial asymmetry, speech difficulty, weakness, light-headedness, numbness and headaches.  Hematological: Negative.  Negative for adenopathy. Does not bruise/bleed easily.  Psychiatric/Behavioral: Negative.  Negative for sleep disturbance and decreased concentration. The patient is not nervous/anxious.     Objective:  BP 130/80 mmHg  Pulse 96  Temp(Src) 98.3 F (36.8 C) (Oral)  Resp 16  Ht 5\' 6"  (1.676 m)  Wt 137 lb (62.143 kg)  BMI 22.12 kg/m2  SpO2 96%  BP Readings from Last 3 Encounters:  08/28/15 130/80  07/11/15 148/86  04/02/15 112/72    Wt Readings from Last 3 Encounters:  08/28/15 137 lb (62.143 kg)  07/11/15 138 lb 9.6 oz (62.869 kg)  04/02/15 140 lb 12.8 oz (63.866 kg)    Physical Exam  Constitutional: She is oriented to person, place, and time. She appears well-developed and well-nourished. No distress.  HENT:  Head: Normocephalic and atraumatic.  Mouth/Throat: Oropharynx is clear and moist. No oropharyngeal exudate.  Eyes: Conjunctivae and EOM are normal. Pupils are equal, round, and reactive to light. Right eye exhibits no discharge. Left eye exhibits no discharge. No scleral icterus. Right eye exhibits normal extraocular motion. Left eye exhibits normal extraocular motion.  Neck: Normal range of motion. Neck  supple. No JVD present. No tracheal deviation present. No thyromegaly present.  Cardiovascular: Normal rate, regular rhythm, normal heart sounds and intact distal pulses.  Exam reveals no gallop and no friction rub.   No murmur heard. Pulmonary/Chest: Effort normal and breath sounds normal. No stridor. No respiratory distress. She has  no wheezes. She has no rales. She exhibits no tenderness.  Abdominal: Soft. Bowel sounds are normal. She exhibits no distension and no mass. There is no tenderness. There is no rebound and no guarding.  Musculoskeletal: Normal range of motion. She exhibits no edema or tenderness.  Lymphadenopathy:    She has no cervical adenopathy.  Neurological: She is alert and oriented to person, place, and time. She has normal reflexes. She displays normal reflexes. No cranial nerve deficit. She exhibits normal muscle tone. Coordination normal.  Skin: Skin is warm and dry. No rash noted. She is not diaphoretic. No erythema. No pallor.  Psychiatric: She has a normal mood and affect. Her behavior is normal. Judgment and thought content normal.  Vitals reviewed.   Lab Results  Component Value Date   WBC 4.8 07/11/2015   HGB 12.1 07/11/2015   HCT 36.4 07/11/2015   PLT 257 07/11/2015   GLUCOSE 110 07/11/2015   CHOL 175 04/02/2015   TRIG 125.0 04/02/2015   HDL 59.70 04/02/2015   LDLCALC 90 04/02/2015   ALT 13 07/11/2015   AST 20 07/11/2015   NA 140 07/11/2015   K 4.5 07/11/2015   CL 103 04/02/2015   CREATININE 0.8 07/11/2015   BUN 11.2 07/11/2015   CO2 28 07/11/2015   TSH 1.20 04/02/2015   HGBA1C 5.9 04/02/2015    No results found.  Assessment & Plan:   Myah was seen today for follow-up.  Diagnoses and all orders for this visit:  Temporal arteritis ([facility])- she does not have any symptoms of headache or visual disturbance so I don't think she has recurrence of temporal arteritis that she is having a flare of polymyalgia rheumatica so we'll restart prednisone. -     predniSONE (DELTASONE) 10 MG tablet; Take 1 tablet (10 mg total) by mouth daily with breakfast.  Need for 23-polyvalent pneumococcal polysaccharide vaccine -     Pneumococcal polysaccharide vaccine 23-valent greater than or equal to 2yo subcutaneous/IM  BPPV (benign paroxysmal positional vertigo), unspecified laterality- she was  given an information sheet on performing the Epley maneuvers and she agrees to start doing them.  PMR (polymyalgia rheumatica) (HCC)- she is having a flareup of this, we'll start prednisone at 10 mg per day. -     predniSONE (DELTASONE) 10 MG tablet; Take 1 tablet (10 mg total) by mouth daily with breakfast.   I have discontinued Ms. Stecklein's predniSONE, BESIVANCE, and PROLENSA. I am also having her start on predniSONE. Additionally, I am having her maintain her aspirin EC, multivitamin with minerals, tamoxifen, and LUMIGAN.  Meds ordered this encounter  Medications  . predniSONE (DELTASONE) 10 MG tablet    Sig: Take 1 tablet (10 mg total) by mouth daily with breakfast.    Dispense:  30 tablet    Refill:  2     Follow-up: Return in about 3 months (around 11/28/2015).  Scarlette Calico, MD

## 2015-08-28 NOTE — Patient Instructions (Addendum)

## 2015-08-28 NOTE — Progress Notes (Signed)
Pre visit review using our clinic review tool, if applicable. No additional management support is needed unless otherwise documented below in the visit note. 

## 2015-09-03 DIAGNOSIS — H40013 Open angle with borderline findings, low risk, bilateral: Secondary | ICD-10-CM | POA: Diagnosis not present

## 2015-09-03 DIAGNOSIS — H43812 Vitreous degeneration, left eye: Secondary | ICD-10-CM | POA: Diagnosis not present

## 2015-09-03 DIAGNOSIS — H26493 Other secondary cataract, bilateral: Secondary | ICD-10-CM | POA: Diagnosis not present

## 2015-11-16 ENCOUNTER — Telehealth: Payer: Self-pay | Admitting: Nurse Practitioner

## 2015-11-16 NOTE — Telephone Encounter (Signed)
Called patient x 2 to notify of new appointment,maield a letter and a calendar

## 2015-11-19 ENCOUNTER — Other Ambulatory Visit: Payer: Self-pay | Admitting: Internal Medicine

## 2015-12-04 DIAGNOSIS — H40013 Open angle with borderline findings, low risk, bilateral: Secondary | ICD-10-CM | POA: Diagnosis not present

## 2015-12-21 ENCOUNTER — Other Ambulatory Visit: Payer: Self-pay | Admitting: Internal Medicine

## 2016-01-04 DIAGNOSIS — Z853 Personal history of malignant neoplasm of breast: Secondary | ICD-10-CM | POA: Diagnosis not present

## 2016-01-04 LAB — HM MAMMOGRAPHY

## 2016-01-05 ENCOUNTER — Telehealth: Payer: Self-pay | Admitting: Oncology

## 2016-01-05 NOTE — Telephone Encounter (Signed)
Not able to reach patient by phone or leave message - picks up phone and hangs up. Moved 3/3 lab/fu to 3/10 due to HB out. Schedule mailed. Also left message for patient dtr Wells Guiles re appointment change.

## 2016-01-07 ENCOUNTER — Encounter: Payer: Self-pay | Admitting: Internal Medicine

## 2016-01-10 ENCOUNTER — Other Ambulatory Visit: Payer: Medicare HMO

## 2016-01-10 ENCOUNTER — Ambulatory Visit: Payer: Medicare HMO | Admitting: Oncology

## 2016-01-11 ENCOUNTER — Other Ambulatory Visit: Payer: Medicare HMO

## 2016-01-11 ENCOUNTER — Ambulatory Visit: Payer: Medicare HMO | Admitting: Nurse Practitioner

## 2016-01-17 ENCOUNTER — Other Ambulatory Visit: Payer: Self-pay

## 2016-01-17 DIAGNOSIS — C50412 Malignant neoplasm of upper-outer quadrant of left female breast: Secondary | ICD-10-CM

## 2016-01-18 ENCOUNTER — Encounter: Payer: Self-pay | Admitting: Nurse Practitioner

## 2016-01-18 ENCOUNTER — Other Ambulatory Visit (HOSPITAL_BASED_OUTPATIENT_CLINIC_OR_DEPARTMENT_OTHER): Payer: Medicare HMO

## 2016-01-18 ENCOUNTER — Other Ambulatory Visit: Payer: Self-pay | Admitting: *Deleted

## 2016-01-18 ENCOUNTER — Ambulatory Visit (HOSPITAL_BASED_OUTPATIENT_CLINIC_OR_DEPARTMENT_OTHER): Payer: Medicare HMO | Admitting: Nurse Practitioner

## 2016-01-18 ENCOUNTER — Telehealth: Payer: Self-pay | Admitting: Oncology

## 2016-01-18 VITALS — BP 160/89 | HR 79 | Temp 97.9°F | Resp 18 | Ht 66.0 in | Wt 142.1 lb

## 2016-01-18 DIAGNOSIS — C50412 Malignant neoplasm of upper-outer quadrant of left female breast: Secondary | ICD-10-CM | POA: Diagnosis not present

## 2016-01-18 DIAGNOSIS — Z79810 Long term (current) use of selective estrogen receptor modulators (SERMs): Secondary | ICD-10-CM

## 2016-01-18 DIAGNOSIS — Z170 Estrogen receptor positive status [ER+]: Secondary | ICD-10-CM

## 2016-01-18 LAB — CBC WITH DIFFERENTIAL/PLATELET
BASO%: 0.5 % (ref 0.0–2.0)
BASOS ABS: 0 10*3/uL (ref 0.0–0.1)
EOS ABS: 0 10*3/uL (ref 0.0–0.5)
EOS%: 0.3 % (ref 0.0–7.0)
HCT: 41 % (ref 34.8–46.6)
HGB: 13.3 g/dL (ref 11.6–15.9)
LYMPH%: 12.1 % — AB (ref 14.0–49.7)
MCH: 29.9 pg (ref 25.1–34.0)
MCHC: 32.5 g/dL (ref 31.5–36.0)
MCV: 91.8 fL (ref 79.5–101.0)
MONO#: 0.3 10*3/uL (ref 0.1–0.9)
MONO%: 5.1 % (ref 0.0–14.0)
NEUT#: 5.6 10*3/uL (ref 1.5–6.5)
NEUT%: 82 % — AB (ref 38.4–76.8)
PLATELETS: 223 10*3/uL (ref 145–400)
RBC: 4.46 10*6/uL (ref 3.70–5.45)
RDW: 14 % (ref 11.2–14.5)
WBC: 6.8 10*3/uL (ref 3.9–10.3)
lymph#: 0.8 10*3/uL — ABNORMAL LOW (ref 0.9–3.3)

## 2016-01-18 LAB — COMPREHENSIVE METABOLIC PANEL
ALBUMIN: 4 g/dL (ref 3.5–5.0)
ALK PHOS: 57 U/L (ref 40–150)
ALT: <9 U/L (ref 0–55)
AST: 20 U/L (ref 5–34)
Anion Gap: 9 mEq/L (ref 3–11)
BUN: 15.6 mg/dL (ref 7.0–26.0)
CO2: 26 mEq/L (ref 22–29)
Calcium: 9.1 mg/dL (ref 8.4–10.4)
Chloride: 106 mEq/L (ref 98–109)
Creatinine: 1 mg/dL (ref 0.6–1.1)
EGFR: 55 mL/min/{1.73_m2} — ABNORMAL LOW (ref >90)
GLUCOSE: 108 mg/dL (ref 70–140)
POTASSIUM: 4.6 meq/L (ref 3.5–5.1)
Sodium: 141 mEq/L (ref 136–145)
TOTAL PROTEIN: 6.7 g/dL (ref 6.4–8.3)
Total Bilirubin: 0.61 mg/dL (ref 0.20–1.20)

## 2016-01-18 MED ORDER — TAMOXIFEN CITRATE 20 MG PO TABS: 20.0000 mg | ORAL_TABLET | Freq: Every day | ORAL | Status: DC

## 2016-01-18 NOTE — Progress Notes (Signed)
[facility]  [phone] [phone]     ID: Lindsey [name] OB: 09/12/1941  MR#: [id number]  EKL#:[id number]  PCP: Sanda Linger, MD GYN:   SU: Glenna Fellows OTHER MD: Lurline Hare  CHIEF COMPLAINT:left breast cancer CURRENT TREATMENT: tamoxifen daily  BREAST CANCER HISTORY: Lindsey [name] noted a mass in her left breast sometime in November. She thought about it but decided to do nothing and specifically she did not bring it to her primary care physician's attention. More recently, in February, she started having systemic symptoms including weight loss, drenching sweats, and overall weakness. She presented to the emergency room with these symptoms and was referred to Dr. Earl Lites for evaluation. He noted a bilateral temporal artery prominence and tenderness and set the patient up for Temporal artery biopsy, performed to 17 2059 showing ([id number]) giant cell arteritis. She was started on prednisone and her symptoms have considerably improved. She is now on a taper.  As part of her evaluation she was noted to have a left breast mass and was referred to [facility] for evaluation. Ultrasound of the left breast mass to 20 02/27/2014 showed it to be largely cystic and it was aspirated the same day. The pathology ([id number]) showed this aggregated fragments of a papillary lesion without cytologic atypia. Accordingly the patient was referred to Dr. Johna Sheriff for excisional biopsy. This was performed 01/30/2014, and showed ([id number] an invasive ductal carcinoma with papillary features (invasive papillary carcinoma), grade 1, measuring 1.5 cm. Margins were negative but less than a millimeter there was no evidence of lymphovascular invasion. The cells were estrogen receptor 100% positive, progesterone receptor 11% positive, with an MIB-1 of 8% and no HER-2 amplification, the signals ratio being 1.56 and a copy number per cell 3.90.  The patient's case was presented at the  multidisciplinary breast cancer conference for age 75. The consensus at that time was that the patient would not need chemotherapy and also would not need sentinel lymph node biopsy as it would not change treatment.  The patient's subsequent history is as detailed below.   INTERVAL HISTORY: Dorsie returns today for follow up and evaluation of her breast cancer. She has been on tamoxifen since May 2015 and continues to tolerate this well. She has some mild vaginal discharge that requires a panty liner, but no more ot flashes. The interval history is generally unremarkable.   REVIEW OF SYSTEMS: Lindsey [name] has a history is temporal arteritis. She stopped her low dose prednisone for 2 weeks and suffered a great amount of pain. She is now on her prescribed dose with no issues, though it took awhile to return to baseline. She denies pain, recent illness, fevers, or chills. She has not been sleeping well, but this may be due to lack of use of her "light box" which helps her body reorient with exposure to natural-like light in the mornings. She is not exercising regularly but has been working in the garden since the weather is nice lately. A detailed review of systems is otherwise entirely negative.   PAST MEDICAL HISTORY: Past Medical History  Diagnosis Date  . Breast cancer (HCC) 01/03/14    left, 12 o'clock, upper outer  . Temporal arteritis (HCC)   . Radiation 03/09/14-03/31/14    Left Breast     PAST SURGICAL HISTORY: Past Surgical History  Procedure Laterality Date  . Tubal ligation      many years ago  . Artery biopsy Right 12/27/2013    Procedure:  BIOPSY TEMPORAL ARTERY;  Surgeon: Atilano Ina, MD;  Location[facility] OR;  Service: General;  Laterality: Right;  . Breast surgery  01/03/14    breast biopsy    FAMILY HISTORY Family History  Problem Relation Age of Onset  . Diabetes Paternal Uncle   . Alzheimer's disease Mother    the patient's father died at the age of 51, from unclear causes. The  patient's mother died at age 38. The patient had 2 brothers, no sisters. There is no breast or ovarian cancer in the family to her knowledge.  GYNECOLOGIC HISTORY:  Menarche age 19, first live birth age 68, she is GX P2. She stopped having periods approximately 1983 and did not take hormone replacement. 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 She is a widow. She lives alone at home, with no pets. Her daughter Lindsey [name] lives in [place], where she works for [employer]. Her daughter Lindsey [name] lives in [place], where she works in a hotel industry. The patient has no grandchildren. She is a International aid/development worker    ADVANCED DIRECTIVES: Not in place   HEALTH MAINTENANCE: Social History  Substance Use Topics  . Smoking status: Never Smoker   . Smokeless tobacco: Never Used  . Alcohol Use: No     Comment: rare occaisons glass of wine     Colonoscopy: Never  PAP: not recenty  Bone density: Never  Lipid panel: not recently  Eye exam in 02/2014  No Known Allergies  Current Outpatient Prescriptions  Medication Sig Dispense Refill  . aspirin EC 81 MG tablet Take 162 mg by mouth at bedtime.    . Multiple Vitamins-Minerals (MULTIVITAMIN WITH MINERALS) tablet Take 1 tablet by mouth daily.    . predniSONE (DELTASONE) 10 MG tablet TAKE 1 TABLET(10 MG) BY MOUTH DAILY WITH BREAKFAST 30 tablet 0  . tamoxifen (NOLVADEX) 20 MG tablet Take 1 tablet (20 mg total) by mouth daily. 30 tablet 5   No current facility-administered medications for this visit.    OBJECTIVE: Older white female who appears stated age 75 Vitals:   01/18/16 1128  BP: 160/89  Pulse: 79  Temp: 97.9 F (36.6 C)  Resp: 18     Body mass index is 22.95 kg/(m^2).      Skin: warm, dry  HEENT: sclerae anicteric, conjunctivae pink, oropharynx clear. No thrush or mucositis.  Lymph Nodes: No cervical or  supraclavicular lymphadenopathy  Lungs: clear to auscultation bilaterally, no rales, wheezes, or rhonci  Heart: regular rate and rhythm  Abdomen: round, soft, non tender, positive bowel sounds  Musculoskeletal: No focal spinal tenderness, no peripheral edema  Neuro: non focal, well oriented, positive affect  Breasts: left breast status post lumpectomy and radiation. No evidence of recurrent disease. Left axilla benign. Right breast unremarkable.  ECOG FS:1 - Symptomatic but completely ambulatory  LAB RESULTS:  CMP     Component Value Date/Time   NA 141 01/18/2016 1118   NA 137 04/02/2015 1633   K 4.6 01/18/2016 1118   K 4.4 04/02/2015 1633   CL 103 04/02/2015 1633   CO2 26 01/18/2016 1118   CO2 29 04/02/2015 1633   GLUCOSE 108 01/18/2016 1118   GLUCOSE 108* 04/02/2015 1633   BUN 15.6 01/18/2016 1118   BUN 16 04/02/2015 1633   CREATININE 1.0 01/18/2016 1118   CREATININE 0.88 04/02/2015 1633   CREATININE 0.68 12/26/2013  1336   CALCIUM 9.1 01/18/2016 1118   CALCIUM 9.3 04/02/2015 1633   PROT 6.7 01/18/2016 1118   PROT 6.9 04/02/2015 1633   ALBUMIN 4.0 01/18/2016 1118   ALBUMIN 4.3 04/02/2015 1633   AST 20 01/18/2016 1118   AST 21 04/02/2015 1633   ALT <9 01/18/2016 1118   ALT 11 04/02/2015 1633   ALKPHOS 57 01/18/2016 1118   ALKPHOS 61 04/02/2015 1633   BILITOT 0.61 01/18/2016 1118   BILITOT 0.4 04/02/2015 1633   GFRNONAA 83* 12/26/2013 1600   GFRAA >90 12/26/2013 1600    I No results found for: SPEP  Lab Results  Component Value Date   WBC 6.8 01/18/2016   NEUTROABS 5.6 01/18/2016   HGB 13.3 01/18/2016   HCT 41.0 01/18/2016   MCV 91.8 01/18/2016   PLT 223 01/18/2016      Chemistry      Component Value Date/Time   NA 141 01/18/2016 1118   NA 137 04/02/2015 1633   K 4.6 01/18/2016 1118   K 4.4 04/02/2015 1633   CL 103 04/02/2015 1633   CO2 26 01/18/2016 1118   CO2 29 04/02/2015 1633   BUN 15.6 01/18/2016 1118   BUN 16 04/02/2015 1633   CREATININE  1.0 01/18/2016 1118   CREATININE 0.88 04/02/2015 1633   CREATININE 0.68 12/26/2013 1336      Component Value Date/Time   CALCIUM 9.1 01/18/2016 1118   CALCIUM 9.3 04/02/2015 1633   ALKPHOS 57 01/18/2016 1118   ALKPHOS 61 04/02/2015 1633   AST 20 01/18/2016 1118   AST 21 04/02/2015 1633   ALT <9 01/18/2016 1118   ALT 11 04/02/2015 1633   BILITOT 0.61 01/18/2016 1118   BILITOT 0.4 04/02/2015 1633       No results found for: LABCA2  No components found for: LABCA125  No results for input(s): INR in the last 168 hours.  Urinalysis    Component Value Date/Time   COLORURINE YELLOW 12/26/2013 1551   APPEARANCEUR CLEAR 12/26/2013 1551   LABSPEC 1.009 12/26/2013 1551   PHURINE 8.0 12/26/2013 1551   GLUCOSEU NEGATIVE 12/26/2013 1551   HGBUR NEGATIVE 12/26/2013 [place] 12/26/2013 1551   BILIRUBINUR neg 12/26/2013 1343   KETONESUR 15* 12/26/2013 1551   PROTEINUR NEGATIVE 12/26/2013 1551   PROTEINUR 30 12/26/2013 1343   UROBILINOGEN 1.0 12/26/2013 1551   UROBILINOGEN 1.0 12/26/2013 1343   NITRITE NEGATIVE 12/26/2013 1551   NITRITE neg 12/26/2013 1343   LEUKOCYTESUR NEGATIVE 12/26/2013 1551    STUDIES: No results found. Most recent mammogram at [facility] on 01/04/16 was unremarkable.  ASSESSMENT: 75 y.o. [place] woman status post left breast excisional biopsy 01/30/2014 for a pT1c cN0, stage IA invasive ductal (papillary) carcinoma, estrogen receptor 100% positive, progesterone receptor 11% positive, with an MIB-1 of 8% and no HER-2 amplification.  1. Breckyn underwent radiation from 03/09/2014 through 03/31/2014  2.  She was started on Tamoxifen daily on 04/06/2014.  PLAN: Rosell looks and feels well today. She had questions about why she never had a MRI done, as compared to some of her peers who have had breast cancer. Dr. Jana Hakim explained that the ultrasound was sufficient to rule out spread to the left axilla. In addition they knew her cancer was low risk  and would not require chemotherapy. In this case an MRI is not indicated. She is tolerating the tamoxifen well and will continue this drug until May 2020 to complete 5 years of antiestrogen therapy.   Webb Silversmith  will return in 6 months for labs and a follow up visit. She understands and agrees with this plan. She knows the goal of treatment in her case is cure. She has been encouraged to call with any issues that might arise before her next visit here.   Laurie Panda, NP  01/18/2016 12:22 PM

## 2016-01-18 NOTE — Telephone Encounter (Signed)
Scheduled patient appt per pof, avs report printed.  °

## 2016-01-23 ENCOUNTER — Other Ambulatory Visit: Payer: Self-pay | Admitting: Internal Medicine

## 2016-01-25 ENCOUNTER — Other Ambulatory Visit: Payer: Self-pay | Admitting: Internal Medicine

## 2016-01-31 ENCOUNTER — Encounter: Payer: Self-pay | Admitting: Internal Medicine

## 2016-01-31 ENCOUNTER — Ambulatory Visit (INDEPENDENT_AMBULATORY_CARE_PROVIDER_SITE_OTHER): Payer: Medicare HMO | Admitting: Internal Medicine

## 2016-01-31 VITALS — BP 148/92 | HR 84 | Temp 98.2°F | Resp 16 | Ht 66.0 in | Wt 141.5 lb

## 2016-01-31 DIAGNOSIS — M353 Polymyalgia rheumatica: Secondary | ICD-10-CM

## 2016-01-31 DIAGNOSIS — E2839 Other primary ovarian failure: Secondary | ICD-10-CM | POA: Diagnosis not present

## 2016-01-31 DIAGNOSIS — I10 Essential (primary) hypertension: Secondary | ICD-10-CM | POA: Diagnosis not present

## 2016-01-31 DIAGNOSIS — Z92241 Personal history of systemic steroid therapy: Secondary | ICD-10-CM | POA: Insufficient documentation

## 2016-01-31 DIAGNOSIS — M316 Other giant cell arteritis: Secondary | ICD-10-CM | POA: Diagnosis not present

## 2016-01-31 MED ORDER — PREDNISONE 5 MG PO TABS: 5.0000 mg | ORAL_TABLET | Freq: Every day | ORAL | Status: DC

## 2016-01-31 NOTE — Progress Notes (Signed)
Subjective:  Patient ID: Lindsey [name], female    DOB: 1941-01-22  Age: 75 y.o. MRN: [id number]  CC: Headache   HPI Lindsey [name] presents for follow-up on a history of temporal arteritis and polymyalgia rheumatica. She tells me that she is feeling well enough to lower her prednisone dose. She very rarely has a discomfort in her left temple but states that she has had no muscle aches, joint aches, neck or back ache. She has been taking prednisone 10 mg a day for the last for 6 months.  Outpatient Prescriptions Prior to Visit  Medication Sig Dispense Refill  . aspirin EC 81 MG tablet Take 162 mg by mouth at bedtime.    . Multiple Vitamins-Minerals (MULTIVITAMIN WITH MINERALS) tablet Take 1 tablet by mouth daily.    . tamoxifen (NOLVADEX) 20 MG tablet Take 1 tablet (20 mg total) by mouth daily. 30 tablet 5  . predniSONE (DELTASONE) 10 MG tablet TAKE 1 TABLET(10 MG) BY MOUTH DAILY WITH BREAKFAST 30 tablet 0   No facility-administered medications prior to visit.    ROS Review of Systems  Constitutional: Negative.  Negative for fever, chills, diaphoresis, appetite change and fatigue.  Eyes: Negative.  Negative for photophobia and visual disturbance.  Respiratory: Negative.  Negative for cough, choking, chest tightness, shortness of breath and stridor.   Cardiovascular: Negative.  Negative for chest pain, palpitations and leg swelling.  Gastrointestinal: Negative.  Negative for nausea, vomiting, abdominal pain, diarrhea, constipation and blood in stool.  Endocrine: Negative.   Genitourinary: Negative.   Musculoskeletal: Negative.  Negative for myalgias, back pain, joint swelling, arthralgias, gait problem, neck pain and neck stiffness.  Skin: Negative.  Negative for color change and rash.  Allergic/Immunologic: Negative.   Neurological: Positive for headaches. Negative for dizziness, tremors, seizures, syncope, facial asymmetry, speech difficulty, weakness, light-headedness and  numbness.  Hematological: Negative.  Negative for adenopathy. Does not bruise/bleed easily.  Psychiatric/Behavioral: Negative.     Objective:  BP 148/92 mmHg  Pulse 84  Temp(Src) 98.2 F (36.8 C) (Oral)  Resp 16  Ht 5\' 6"  (1.676 m)  Wt 141 lb 8 oz (64.184 kg)  BMI 22.85 kg/m2  SpO2 97%  BP Readings from Last 3 Encounters:  01/31/16 148/92  01/18/16 160/89  08/28/15 130/80    Wt Readings from Last 3 Encounters:  01/31/16 141 lb 8 oz (64.184 kg)  01/18/16 142 lb 1.6 oz (64.456 kg)  08/28/15 137 lb (62.143 kg)    Physical Exam  Constitutional: She is oriented to person, place, and time. She appears well-developed and well-nourished. No distress.  HENT:  Head: Normocephalic and atraumatic.  Mouth/Throat: Oropharynx is clear and moist. No oropharyngeal exudate.  Eyes: Conjunctivae are normal. Right eye exhibits no discharge. Left eye exhibits no discharge. No scleral icterus.  Neck: Normal range of motion. Neck supple. No JVD present. No tracheal deviation present. No thyromegaly present.  Cardiovascular: Normal rate, regular rhythm, normal heart sounds and intact distal pulses.  Exam reveals no gallop and no friction rub.   No murmur heard. Pulmonary/Chest: Effort normal and breath sounds normal. No stridor. No respiratory distress. She has no wheezes. She has no rales. She exhibits no tenderness.  Abdominal: Soft. Bowel sounds are normal. She exhibits no distension and no mass. There is no tenderness. There is no rebound and no guarding.  Musculoskeletal: Normal range of motion. She exhibits no edema or tenderness.  Lymphadenopathy:    She has no cervical adenopathy.  Neurological: She is  oriented to person, place, and time.  Skin: Skin is warm and dry. No rash noted. She is not diaphoretic. No erythema. No pallor.  Vitals reviewed.   Lab Results  Component Value Date   WBC 6.8 01/18/2016   HGB 13.3 01/18/2016   HCT 41.0 01/18/2016   PLT 223 01/18/2016   GLUCOSE 108  01/18/2016   CHOL 175 04/02/2015   TRIG 125.0 04/02/2015   HDL 59.70 04/02/2015   LDLCALC 90 04/02/2015   ALT <9 01/18/2016   AST 20 01/18/2016   NA 141 01/18/2016   K 4.6 01/18/2016   CL 103 04/02/2015   CREATININE 1.0 01/18/2016   BUN 15.6 01/18/2016   CO2 26 01/18/2016   TSH 1.20 04/02/2015   HGBA1C 5.9 04/02/2015    No results found.  Assessment & Plan:   [place] was seen today for headache.  Diagnoses and all orders for this visit:  PMR (polymyalgia rheumatica) ([place])- will decrease her prednisone dose by 50% -     predniSONE (DELTASONE) 5 MG tablet; Take 1 tablet (5 mg total) by mouth daily with breakfast.  Temporal arteritis ([place])- she appears to be improving and is ready to decrease her prednisone, will drop it down to 5 mg a day -     predniSONE (DELTASONE) 5 MG tablet; Take 1 tablet (5 mg total) by mouth daily with breakfast.  Essential hypertension- her blood pressure is moderately elevated, she is not willing to start an antihypertensive at this time. We'll continue to follow this and treat as indicated.  Estrogen deficiency- she is due for DEXA scan. -     DG Bone Density; Future  H/O systemic steroid therapy- she is due for DEXA scan to see if the steroids have caused osteoporosis -     DG Bone Density; Future  I have discontinued Ms. Gedeon's predniSONE. I am also having her start on predniSONE. Additionally, I am having her maintain her aspirin EC, multivitamin with minerals, and tamoxifen.  Meds ordered this encounter  Medications  . predniSONE (DELTASONE) 5 MG tablet    Sig: Take 1 tablet (5 mg total) by mouth daily with breakfast.    Dispense:  30 tablet    Refill:  5     Follow-up: Return in about 4 months (around 06/01/2016).  Scarlette Calico, MD

## 2016-01-31 NOTE — Patient Instructions (Signed)
Hypertension Hypertension, commonly called high blood pressure, is when the force of blood pumping through your arteries is too strong. Your arteries are the blood vessels that carry blood from your heart throughout your body. A blood pressure reading consists of a higher number over a lower number, such as 110/72. The higher number (systolic) is the pressure inside your arteries when your heart pumps. The lower number (diastolic) is the pressure inside your arteries when your heart relaxes. Ideally you want your blood pressure below 120/80. Hypertension forces your heart to work harder to pump blood. Your arteries may become narrow or stiff. Having untreated or uncontrolled hypertension can cause heart attack, stroke, kidney disease, and other problems. RISK FACTORS Some risk factors for high blood pressure are controllable. Others are not.  Risk factors you cannot control include:   Race. You may be at higher risk if you are African American.  Age. Risk increases with age.  Gender. Men are at higher risk than women before age 45 years. After age 65, women are at higher risk than men. Risk factors you can control include:  Not getting enough exercise or physical activity.  Being overweight.  Getting too much fat, sugar, calories, or salt in your diet.  Drinking too much alcohol. SIGNS AND SYMPTOMS Hypertension does not usually cause signs or symptoms. Extremely high blood pressure (hypertensive crisis) may cause headache, anxiety, shortness of breath, and nosebleed. DIAGNOSIS To check if you have hypertension, your health care provider will measure your blood pressure while you are seated, with your arm held at the level of your heart. It should be measured at least twice using the same arm. Certain conditions can cause a difference in blood pressure between your right and left arms. A blood pressure reading that is higher than normal on one occasion does not mean that you need treatment. If  it is not clear whether you have high blood pressure, you may be asked to return on a different day to have your blood pressure checked again. Or, you may be asked to monitor your blood pressure at home for 1 or more weeks. TREATMENT Treating high blood pressure includes making lifestyle changes and possibly taking medicine. Living a healthy lifestyle can help lower high blood pressure. You may need to change some of your habits. Lifestyle changes may include:  Following the DASH diet. This diet is high in fruits, vegetables, and whole grains. It is low in salt, red meat, and added sugars.  Keep your sodium intake below 2,300 mg per day.  Getting at least 30-45 minutes of aerobic exercise at least 4 times per week.  Losing weight if necessary.  Not smoking.  Limiting alcoholic beverages.  Learning ways to reduce stress. Your health care provider may prescribe medicine if lifestyle changes are not enough to get your blood pressure under control, and if one of the following is true:  You are 18-59 years of age and your systolic blood pressure is above 140.  You are 60 years of age or older, and your systolic blood pressure is above 150.  Your diastolic blood pressure is above 90.  You have diabetes, and your systolic blood pressure is over 140 or your diastolic blood pressure is over 90.  You have kidney disease and your blood pressure is above 140/90.  You have heart disease and your blood pressure is above 140/90. Your personal target blood pressure may vary depending on your medical conditions, your age, and other factors. HOME CARE INSTRUCTIONS    Have your blood pressure rechecked as directed by your health care provider.   Take medicines only as directed by your health care provider. Follow the directions carefully. Blood pressure medicines must be taken as prescribed. The medicine does not work as well when you skip doses. Skipping doses also puts you at risk for  problems.  Do not smoke.   Monitor your blood pressure at home as directed by your health care provider. SEEK MEDICAL CARE IF:   You think you are having a reaction to medicines taken.  You have recurrent headaches or feel dizzy.  You have swelling in your ankles.  You have trouble with your vision. SEEK IMMEDIATE MEDICAL CARE IF:  You develop a severe headache or confusion.  You have unusual weakness, numbness, or feel faint.  You have severe chest or abdominal pain.  You vomit repeatedly.  You have trouble breathing. MAKE SURE YOU:   Understand these instructions.  Will watch your condition.  Will get help right away if you are not doing well or get worse.   This information is not intended to replace advice given to you by your health care provider. Make sure you discuss any questions you have with your health care provider.   Document Released: 10/27/2005 Document Revised: 03/13/2015 Document Reviewed: 08/19/2013 Elsevier Interactive Patient Education 2016 [employer]vier Inc.  

## 2016-01-31 NOTE — Progress Notes (Signed)
Pre visit review using our clinic review tool, if applicable. No additional management support is needed unless otherwise documented below in the visit note. (sarah self, [employer] student  documented this record)  

## 2016-02-14 ENCOUNTER — Ambulatory Visit (INDEPENDENT_AMBULATORY_CARE_PROVIDER_SITE_OTHER)
Admission: RE | Admit: 2016-02-14 | Discharge: 2016-02-14 | Disposition: A | Discharge status: Home / Self Care | Payer: Medicare HMO | Source: Ambulatory Visit | Attending: Internal Medicine | Admitting: Internal Medicine

## 2016-02-14 DIAGNOSIS — Z92241 Personal history of systemic steroid therapy: Secondary | ICD-10-CM

## 2016-02-14 DIAGNOSIS — E2839 Other primary ovarian failure: Secondary | ICD-10-CM | POA: Diagnosis not present

## 2016-02-18 LAB — HM DEXA SCAN

## 2016-03-21 ENCOUNTER — Other Ambulatory Visit: Payer: Self-pay | Admitting: *Deleted

## 2016-03-21 MED ORDER — TAMOXIFEN CITRATE 20 MG PO TABS: 20.0000 mg | ORAL_TABLET | Freq: Every day | ORAL | Status: DC

## 2016-04-01 DIAGNOSIS — H40013 Open angle with borderline findings, low risk, bilateral: Secondary | ICD-10-CM | POA: Diagnosis not present

## 2016-07-18 ENCOUNTER — Other Ambulatory Visit: Payer: Self-pay

## 2016-07-18 DIAGNOSIS — C50412 Malignant neoplasm of upper-outer quadrant of left female breast: Secondary | ICD-10-CM

## 2016-07-20 NOTE — Progress Notes (Signed)
[place]  [phone] [phone]) [phone]     ID: Lindsey [name] OB: 1941-08-22  MR#: [id number]  YBW#:[id number]  PCP: Scarlette Calico, MD GYN:   SU: Excell Seltzer OTHER MD: Thea Silversmith  CHIEF COMPLAINT:left breast cancer  CURRENT TREATMENT: tamoxifen   BREAST CANCER HISTORY: From the original intake note:  Lindsey [name] noted a mass in her left breast sometime in November. She thought about it but decided to do nothing and specifically she did not bring it to her primary care physician's attention. More recently, in February, she started having systemic symptoms including weight loss, drenching sweats, and overall weakness. She presented to the emergency room with these symptoms and was referred to Dr. Nena Jordan for evaluation. He noted a bilateral temporal artery prominence and tenderness and set the patient up for Temporal artery biopsy, performed to 17 2059 showing (SZ A [id number]) giant cell arteritis. She was started on prednisone and her symptoms have considerably improved. She is now on a taper.  As part of her evaluation she was noted to have a left breast mass and was referred [facility] for [facility]. Ultrasound of the left breast mass to 20 02/27/2014 showed it to be largely cystic and it was aspirated the same day. The pathology ([id number]) showed this aggregated fragments of a papillary lesion without cytologic atypia. Accordingly the patient was referred to Dr. Excell Seltzer for excisional biopsy. This was performed 01/30/2014, and showed ([id number] an invasive ductal carcinoma with papillary features (invasive papillary carcinoma), grade 1, measuring 1.5 cm. Margins were negative but less than a millimeter there was no evidence of lymphovascular invasion. The cells were estrogen receptor 100% positive, progesterone receptor 11% positive, with an MIB-1 of 8% and no HER-2 amplification, the signals ratio being 1.56 and a copy number per cell 3.90.  The  patient's case was presented at the multidisciplinary breast cancer conference for age 28. The consensus at that time was that the patient would not need chemotherapy and also would not need sentinel lymph node biopsy as it would not change treatment.  The patient's subsequent history is as detailed below.   INTERVAL HISTORY: Lindsey [name] returns today for follow up of her estrogen receptor positive breast cancer. She continues on tamoxifen, with good tolerance. She has a little bit of a vaginal discharge as her only side effect. She is obtaining the drug at a good price.  REVIEW OF SYSTEMS: Lindsey [name] is overall doing "okay". She just feels like she has "no life". Sometimes it takes her half a day to get out of bed. She is very isolated. She walks for exercise and does some yard work. She is doing her best to cut back on her prednisone but when she does be temporal arteritis takes over and she feels very fatigued. A detailed review of systems today was otherwise stable  PAST MEDICAL HISTORY: Past Medical History:  Diagnosis Date  . Breast cancer ([facility]) 01/03/14   left, 12 o'clock, upper outer  . Radiation 03/09/14-03/31/14   Left Breast   . Temporal arteritis ([place])     PAST SURGICAL HISTORY: Past Surgical History:  Procedure Laterality Date  . ARTERY BIOPSY Right 12/27/2013   Procedure: BIOPSY TEMPORAL ARTERY;  Surgeon: Gayland Curry, MD;  Location: [facility];  Service: General;  Laterality: Right;  . BREAST SURGERY  01/03/14   breast biopsy  . TUBAL LIGATION     many years ago    FAMILY HISTORY Family History  Problem Relation Age  of Onset  . Diabetes Paternal Uncle   . Alzheimer's disease Mother    the patient's father died at the age of 83, from unclear causes. The patient's mother died at age 26. The patient had 2 brothers, no sisters. There is no breast or ovarian cancer in the family to her knowledge.  GYNECOLOGIC HISTORY:  Menarche age 28, first live birth age 71, she is [place] P2. She stopped  having periods approximately 1983 and did not take hormone replacement. 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 She lives alone at home, with no pets. Her daughter Lindsey [name] lives in [place], where she works for [employer]. Her daughter Lindsey [name] lives in [place], [place], where she works in a hotel industry. The patient has no grandchildren. She is a Tourist information centre manager    ADVANCED DIRECTIVES: Not in place   HEALTH MAINTENANCE: Social History  Substance Use Topics  . Smoking status: Never Smoker  . Smokeless tobacco: Never Used  . Alcohol use No     Comment: rare occaisons glass of wine     Colonoscopy: Never  PAP: not recenty  Bone density: Never  Lipid panel: not recently  Eye exam in 02/2014  No Known Allergies  Current Outpatient Prescriptions  Medication Sig Dispense Refill  . aspirin EC 81 MG tablet Take 162 mg by mouth at bedtime.    . Multiple Vitamins-Minerals (MULTIVITAMIN WITH MINERALS) tablet Take 1 tablet by mouth daily.    . predniSONE (DELTASONE) 5 MG tablet Take 1 tablet (5 mg total) by mouth daily with breakfast. 30 tablet 5  . tamoxifen (NOLVADEX) 20 MG tablet Take 1 tablet (20 mg total) by mouth daily. 30 tablet 5   No current facility-administered medications for this visit.     OBJECTIVE: Older white female In no acute distress Vitals:   07/21/16 1049  BP: 136/78  Pulse: 93  Resp: 18  Temp: 98.5 F (36.9 C)     Body mass index is 23.02 kg/m.      Sclerae unicteric, pupils round and equal Oropharynx clear and moist-- no thrush or other lesions No cervical or supraclavicular adenopathy Lungs no rales or rhonchi Heart regular rate and rhythm Abd soft, nontender, positive bowel sounds MSK no focal spinal tenderness, no upper extremity lymphedema Neuro: nonfocal, well oriented, appropriate affect Breast: The right breast  is unremarkable. Left breast is status post lumpectomy and radiation. There is no evidence of local recurrence. The left axilla is benign.  ECOG FS:1 - Symptomatic but completely ambulatory  LAB RESULTS:  CMP     Component Value Date/Time   NA 140 07/21/2016 0957   K 4.7 07/21/2016 0957   CL 103 04/02/2015 1633   CO2 24 07/21/2016 0957   GLUCOSE 102 07/21/2016 0957   BUN 17.7 07/21/2016 0957   CREATININE 1.0 07/21/2016 0957   CALCIUM 9.6 07/21/2016 0957   PROT 7.1 07/21/2016 0957   ALBUMIN 3.7 07/21/2016 0957   AST 15 07/21/2016 0957   ALT <9 07/21/2016 0957   ALKPHOS 70 07/21/2016 0957   BILITOT 0.42 07/21/2016 0957   GFRNONAA 83 (L) 12/26/2013 1600   GFRAA >90 12/26/2013 1600    I No results found for: SPEP  Lab Results  Component Value Date   WBC 6.0 07/21/2016   NEUTROABS 4.1 07/21/2016   HGB 12.8 07/21/2016   HCT 39.0  07/21/2016   MCV 91.0 07/21/2016   PLT 253 07/21/2016      Chemistry      Component Value Date/Time   NA 140 07/21/2016 0957   K 4.7 07/21/2016 0957   CL 103 04/02/2015 1633   CO2 24 07/21/2016 0957   BUN 17.7 07/21/2016 0957   CREATININE 1.0 07/21/2016 0957      Component Value Date/Time   CALCIUM 9.6 07/21/2016 0957   ALKPHOS 70 07/21/2016 0957   AST 15 07/21/2016 0957   ALT <9 07/21/2016 0957   BILITOT 0.42 07/21/2016 0957       No results found for: LABCA2  No components found for: LABCA125  No results for input(s): INR in the last 168 hours.  Urinalysis    Component Value Date/Time   COLORURINE YELLOW 12/26/2013 1551   APPEARANCEUR CLEAR 12/26/2013 1551   LABSPEC 1.009 12/26/2013 1551   PHURINE 8.0 12/26/2013 1551   GLUCOSEU NEGATIVE 12/26/2013 1551   HGBUR NEGATIVE 12/26/2013 1551   BILIRUBINUR NEGATIVE 12/26/2013 1551   BILIRUBINUR neg 12/26/2013 1343   KETONESUR 15 (A) 12/26/2013 1551   PROTEINUR NEGATIVE 12/26/2013 1551   UROBILINOGEN 1.0 12/26/2013 1551   NITRITE NEGATIVE 12/26/2013 1551   LEUKOCYTESUR  NEGATIVE 12/26/2013 1551    STUDIES: Date of study: 02/14/16 Exam: DUAL X-RAY ABSORPTIOMETRY (DXA) FOR BONE MINERAL DENSITY (BMD) Instrument: [employer] Therapist, art Provider: PCP Indication: follow up for low BMD Comparison: none (please note that it is not possible to compare data from different instruments) Clinical data: Pt is a postmenopausal 75 y.o. female with no previous h/o fracture. On calcium and vitamin D.  Results:  Lumbar spine (L1-L4) Femoral neck (FN) 33% distal radius  T-score -2.2 RFN:-1.7 LFN:-2.1 n/a  Change in BMD from previous DXA test (%) n/a n/a n/a  (*) statistically significant  Assessment: the BMD is low according to the [facility] classification for osteoporosis (see below). Fracture risk: moderate FRAX score: not calculated  Comments: the technical quality of the study is good. Evaluation for secondary causes should be considered if clinically indicated.  Recommend optimizing calcium (1200 mg/day) and vitamin D (800 IU/day) intake.  Followup: Repeat BMD is appropriate after 2 years or after 1-2 years if starting treatment   ASSESSMENT: 75 y.o. [place] woman status post left breast upper outer quadrant excisional biopsy 01/30/2014 for a pT1c cN0, stage IA invasive ductal (papillary) carcinoma, estrogen receptor 100% positive, progesterone receptor 11% positive, with an MIB-1 of 8% and no HER-2 amplification.  1. Lindsey [name] underwent radiation from 03/09/2014 through 03/31/2014  2.  She was started on Tamoxifen daily on 04/06/2014.  3. DEXA scan 02/14/2016 shows osteopenia with a T score of -2.2  PLAN: Lindsey [name] is now 2-1/2 years out from definitive surgery for her breast cancer with no evidence of disease recurrence. His is very favorable.  She is struggling with her temporal arteritis but overall I think she is doing well and hopefully she will be able to cut back on the prednisone to some extent.  Of course the steroids will worsen the osteopenia problem.  Hopefully the tamoxifen will help in that regard as well as for the breast cancer risk reduction.  I think she would be happier if she had more of a social life. We discussed that she can develop that and I gave her information on certain groups available to cancer patients that she can consider joining.  Otherwise she will see me again in a year. She knows to call for any  problems that may develop before that visit.  Chauncey Cruel, MD  07/23/2016 4:54 PM

## 2016-07-21 ENCOUNTER — Ambulatory Visit (HOSPITAL_BASED_OUTPATIENT_CLINIC_OR_DEPARTMENT_OTHER): Payer: Medicare HMO | Admitting: Oncology

## 2016-07-21 ENCOUNTER — Other Ambulatory Visit (HOSPITAL_BASED_OUTPATIENT_CLINIC_OR_DEPARTMENT_OTHER): Payer: Medicare HMO

## 2016-07-21 ENCOUNTER — Telehealth: Payer: Self-pay | Admitting: Oncology

## 2016-07-21 VITALS — BP 136/78 | HR 93 | Temp 98.5°F | Resp 18 | Ht 66.0 in | Wt 142.6 lb

## 2016-07-21 DIAGNOSIS — C50412 Malignant neoplasm of upper-outer quadrant of left female breast: Secondary | ICD-10-CM | POA: Diagnosis not present

## 2016-07-21 DIAGNOSIS — M8580 Other specified disorders of bone density and structure, unspecified site: Secondary | ICD-10-CM

## 2016-07-21 DIAGNOSIS — Z79810 Long term (current) use of selective estrogen receptor modulators (SERMs): Secondary | ICD-10-CM

## 2016-07-21 DIAGNOSIS — Z170 Estrogen receptor positive status [ER+]: Secondary | ICD-10-CM

## 2016-07-21 LAB — COMPREHENSIVE METABOLIC PANEL
ANION GAP: 11 meq/L (ref 3–11)
AST: 15 U/L (ref 5–34)
Albumin: 3.7 g/dL (ref 3.5–5.0)
Alkaline Phosphatase: 70 U/L (ref 40–150)
BILIRUBIN TOTAL: 0.42 mg/dL (ref 0.20–1.20)
BUN: 17.7 mg/dL (ref 7.0–26.0)
CO2: 24 meq/L (ref 22–29)
CREATININE: 1 mg/dL (ref 0.6–1.1)
Calcium: 9.6 mg/dL (ref 8.4–10.4)
Chloride: 105 mEq/L (ref 98–109)
EGFR: 53 mL/min/{1.73_m2} — ABNORMAL LOW (ref >90)
GLUCOSE: 102 mg/dL (ref 70–140)
Potassium: 4.7 mEq/L (ref 3.5–5.1)
SODIUM: 140 meq/L (ref 136–145)
TOTAL PROTEIN: 7.1 g/dL (ref 6.4–8.3)

## 2016-07-21 LAB — CBC WITH DIFFERENTIAL/PLATELET
BASO%: 0.9 % (ref 0.0–2.0)
Basophils Absolute: 0.1 10*3/uL (ref 0.0–0.1)
EOS%: 1.4 % (ref 0.0–7.0)
Eosinophils Absolute: 0.1 10*3/uL (ref 0.0–0.5)
HCT: 39 % (ref 34.8–46.6)
HGB: 12.8 g/dL (ref 11.6–15.9)
LYMPH#: 1 10*3/uL (ref 0.9–3.3)
LYMPH%: 16.6 % (ref 14.0–49.7)
MCH: 29.8 pg (ref 25.1–34.0)
MCHC: 32.8 g/dL (ref 31.5–36.0)
MCV: 91 fL (ref 79.5–101.0)
MONO#: 0.7 10*3/uL (ref 0.1–0.9)
MONO%: 11.5 % (ref 0.0–14.0)
NEUT%: 69.6 % (ref 38.4–76.8)
NEUTROS ABS: 4.1 10*3/uL (ref 1.5–6.5)
PLATELETS: 253 10*3/uL (ref 145–400)
RBC: 4.29 10*6/uL (ref 3.70–5.45)
RDW: 12.8 % (ref 11.2–14.5)
WBC: 6 10*3/uL (ref 3.9–10.3)

## 2016-07-21 NOTE — Telephone Encounter (Signed)
appt made and avs printed °

## 2016-07-24 ENCOUNTER — Other Ambulatory Visit: Payer: Self-pay | Admitting: Internal Medicine

## 2016-07-24 DIAGNOSIS — M316 Other giant cell arteritis: Secondary | ICD-10-CM

## 2016-07-24 DIAGNOSIS — M353 Polymyalgia rheumatica: Secondary | ICD-10-CM

## 2016-08-04 DIAGNOSIS — H52222 Regular astigmatism, left eye: Secondary | ICD-10-CM | POA: Diagnosis not present

## 2016-08-04 DIAGNOSIS — H26493 Other secondary cataract, bilateral: Secondary | ICD-10-CM | POA: Diagnosis not present

## 2016-08-04 DIAGNOSIS — H04123 Dry eye syndrome of bilateral lacrimal glands: Secondary | ICD-10-CM | POA: Diagnosis not present

## 2016-08-04 DIAGNOSIS — H524 Presbyopia: Secondary | ICD-10-CM | POA: Diagnosis not present

## 2016-08-04 DIAGNOSIS — H43813 Vitreous degeneration, bilateral: Secondary | ICD-10-CM | POA: Diagnosis not present

## 2016-08-04 DIAGNOSIS — H40013 Open angle with borderline findings, low risk, bilateral: Secondary | ICD-10-CM | POA: Diagnosis not present

## 2016-08-05 DIAGNOSIS — R69 Illness, unspecified: Secondary | ICD-10-CM | POA: Diagnosis not present

## 2017-01-06 DIAGNOSIS — Z853 Personal history of malignant neoplasm of breast: Secondary | ICD-10-CM | POA: Diagnosis not present

## 2017-01-06 LAB — HM MAMMOGRAPHY

## 2017-01-13 ENCOUNTER — Encounter: Payer: Self-pay | Admitting: Internal Medicine

## 2017-01-13 NOTE — Progress Notes (Unsigned)
Results entered and sent to scan  

## 2017-01-21 LAB — HM MAMMOGRAPHY

## 2017-02-02 DIAGNOSIS — H43813 Vitreous degeneration, bilateral: Secondary | ICD-10-CM | POA: Diagnosis not present

## 2017-02-02 DIAGNOSIS — H26493 Other secondary cataract, bilateral: Secondary | ICD-10-CM | POA: Diagnosis not present

## 2017-02-02 DIAGNOSIS — H40013 Open angle with borderline findings, low risk, bilateral: Secondary | ICD-10-CM | POA: Diagnosis not present

## 2017-02-02 DIAGNOSIS — H04123 Dry eye syndrome of bilateral lacrimal glands: Secondary | ICD-10-CM | POA: Diagnosis not present

## 2017-03-10 ENCOUNTER — Other Ambulatory Visit: Payer: Self-pay | Admitting: *Deleted

## 2017-03-10 MED ORDER — TAMOXIFEN CITRATE 20 MG PO TABS: 20.0000 mg | ORAL_TABLET | Freq: Every day | ORAL | 5 refills | Status: DC

## 2017-03-24 DIAGNOSIS — H04123 Dry eye syndrome of bilateral lacrimal glands: Secondary | ICD-10-CM | POA: Diagnosis not present

## 2017-03-24 DIAGNOSIS — H43813 Vitreous degeneration, bilateral: Secondary | ICD-10-CM | POA: Diagnosis not present

## 2017-03-24 DIAGNOSIS — H26493 Other secondary cataract, bilateral: Secondary | ICD-10-CM | POA: Diagnosis not present

## 2017-03-24 DIAGNOSIS — H40013 Open angle with borderline findings, low risk, bilateral: Secondary | ICD-10-CM | POA: Diagnosis not present

## 2017-07-20 ENCOUNTER — Other Ambulatory Visit: Payer: Self-pay

## 2017-07-20 DIAGNOSIS — C50412 Malignant neoplasm of upper-outer quadrant of left female breast: Secondary | ICD-10-CM

## 2017-07-21 ENCOUNTER — Other Ambulatory Visit (HOSPITAL_BASED_OUTPATIENT_CLINIC_OR_DEPARTMENT_OTHER): Payer: Medicare HMO

## 2017-07-21 ENCOUNTER — Ambulatory Visit (HOSPITAL_BASED_OUTPATIENT_CLINIC_OR_DEPARTMENT_OTHER): Payer: Medicare HMO | Admitting: Oncology

## 2017-07-21 DIAGNOSIS — C50412 Malignant neoplasm of upper-outer quadrant of left female breast: Secondary | ICD-10-CM

## 2017-07-21 DIAGNOSIS — Z170 Estrogen receptor positive status [ER+]: Secondary | ICD-10-CM | POA: Diagnosis not present

## 2017-07-21 DIAGNOSIS — M316 Other giant cell arteritis: Secondary | ICD-10-CM

## 2017-07-21 DIAGNOSIS — M353 Polymyalgia rheumatica: Secondary | ICD-10-CM

## 2017-07-21 DIAGNOSIS — C50812 Malignant neoplasm of overlapping sites of left female breast: Secondary | ICD-10-CM

## 2017-07-21 DIAGNOSIS — Z79810 Long term (current) use of selective estrogen receptor modulators (SERMs): Secondary | ICD-10-CM

## 2017-07-21 DIAGNOSIS — M8580 Other specified disorders of bone density and structure, unspecified site: Secondary | ICD-10-CM

## 2017-07-21 LAB — COMPREHENSIVE METABOLIC PANEL
ALBUMIN: 4.1 g/dL (ref 3.5–5.0)
ALT: <6 U/L (ref 0–55)
AST: 17 U/L (ref 5–34)
Alkaline Phosphatase: 71 U/L (ref 40–150)
Anion Gap: 10 mEq/L (ref 3–11)
BUN: 12.3 mg/dL (ref 7.0–26.0)
CALCIUM: 10.2 mg/dL (ref 8.4–10.4)
CO2: 26 mEq/L (ref 22–29)
Chloride: 102 mEq/L (ref 98–109)
Creatinine: 1 mg/dL (ref 0.6–1.1)
EGFR: 55 mL/min/{1.73_m2} — AB (ref >90)
GLUCOSE: 121 mg/dL (ref 70–140)
POTASSIUM: 4.4 meq/L (ref 3.5–5.1)
SODIUM: 138 meq/L (ref 136–145)
Total Bilirubin: 0.56 mg/dL (ref 0.20–1.20)
Total Protein: 7.2 g/dL (ref 6.4–8.3)

## 2017-07-21 LAB — CBC WITH DIFFERENTIAL/PLATELET
BASO%: 0.6 % (ref 0.0–2.0)
Basophils Absolute: 0 10*3/uL (ref 0.0–0.1)
EOS%: 0.9 % (ref 0.0–7.0)
Eosinophils Absolute: 0.1 10*3/uL (ref 0.0–0.5)
HCT: 39.4 % (ref 34.8–46.6)
HGB: 13 g/dL (ref 11.6–15.9)
LYMPH%: 19.9 % (ref 14.0–49.7)
MCH: 30.1 pg (ref 25.1–34.0)
MCHC: 32.9 g/dL (ref 31.5–36.0)
MCV: 91.5 fL (ref 79.5–101.0)
MONO#: 0.5 10*3/uL (ref 0.1–0.9)
MONO%: 9 % (ref 0.0–14.0)
NEUT%: 69.6 % (ref 38.4–76.8)
NEUTROS ABS: 4 10*3/uL (ref 1.5–6.5)
Platelets: 240 10*3/uL (ref 145–400)
RBC: 4.31 10*6/uL (ref 3.70–5.45)
RDW: 13.7 % (ref 11.2–14.5)
WBC: 5.7 10*3/uL (ref 3.9–10.3)
lymph#: 1.1 10*3/uL (ref 0.9–3.3)

## 2017-07-21 MED ORDER — TAMOXIFEN CITRATE 20 MG PO TABS: 20.0000 mg | ORAL_TABLET | Freq: Every day | ORAL | 5 refills | Status: DC

## 2017-07-21 MED ORDER — PREDNISONE 1 MG PO TABS: 2.0000 mg | ORAL_TABLET | Freq: Every day | ORAL | 3 refills | Status: DC
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
## 2017-07-22 ENCOUNTER — Telehealth: Payer: Self-pay | Admitting: Oncology

## 2017-07-22 NOTE — Telephone Encounter (Signed)
Called patient regarding September 2019 will mail letter

## 2017-07-28 DIAGNOSIS — H43813 Vitreous degeneration, bilateral: Secondary | ICD-10-CM | POA: Diagnosis not present

## 2017-07-28 DIAGNOSIS — H52222 Regular astigmatism, left eye: Secondary | ICD-10-CM | POA: Diagnosis not present

## 2017-07-28 DIAGNOSIS — H04123 Dry eye syndrome of bilateral lacrimal glands: Secondary | ICD-10-CM | POA: Diagnosis not present

## 2017-07-28 DIAGNOSIS — H26493 Other secondary cataract, bilateral: Secondary | ICD-10-CM | POA: Diagnosis not present

## 2017-07-28 DIAGNOSIS — H40013 Open angle with borderline findings, low risk, bilateral: Secondary | ICD-10-CM | POA: Diagnosis not present

## 2017-07-28 DIAGNOSIS — H524 Presbyopia: Secondary | ICD-10-CM | POA: Diagnosis not present

## 2017-08-03 DIAGNOSIS — R69 Illness, unspecified: Secondary | ICD-10-CM | POA: Diagnosis not present

## 2017-12-04 DIAGNOSIS — H04123 Dry eye syndrome of bilateral lacrimal glands: Secondary | ICD-10-CM | POA: Diagnosis not present

## 2017-12-04 DIAGNOSIS — H40013 Open angle with borderline findings, low risk, bilateral: Secondary | ICD-10-CM | POA: Diagnosis not present

## 2018-01-07 DIAGNOSIS — R922 Inconclusive mammogram: Secondary | ICD-10-CM | POA: Diagnosis not present

## 2018-01-07 DIAGNOSIS — Z853 Personal history of malignant neoplasm of breast: Secondary | ICD-10-CM | POA: Diagnosis not present

## 2018-03-08 ENCOUNTER — Other Ambulatory Visit: Payer: Self-pay | Admitting: Oncology

## 2018-04-02 DIAGNOSIS — H04123 Dry eye syndrome of bilateral lacrimal glands: Secondary | ICD-10-CM | POA: Diagnosis not present

## 2018-04-02 DIAGNOSIS — H40013 Open angle with borderline findings, low risk, bilateral: Secondary | ICD-10-CM | POA: Diagnosis not present

## 2018-04-06 ENCOUNTER — Other Ambulatory Visit: Payer: Self-pay | Admitting: Oncology

## 2018-04-07 ENCOUNTER — Other Ambulatory Visit: Payer: Self-pay | Admitting: Oncology

## 2018-05-05 ENCOUNTER — Other Ambulatory Visit: Payer: Self-pay | Admitting: Oncology

## 2018-06-14 ENCOUNTER — Other Ambulatory Visit: Payer: Self-pay | Admitting: Oncology

## 2018-06-14 DIAGNOSIS — M353 Polymyalgia rheumatica: Secondary | ICD-10-CM

## 2018-06-14 DIAGNOSIS — M316 Other giant cell arteritis: Secondary | ICD-10-CM

## 2018-07-20 ENCOUNTER — Other Ambulatory Visit: Payer: Self-pay | Admitting: *Deleted

## 2018-07-20 DIAGNOSIS — C50412 Malignant neoplasm of upper-outer quadrant of left female breast: Secondary | ICD-10-CM

## 2018-07-20 NOTE — Progress Notes (Signed)
No show

## 2018-07-21 ENCOUNTER — Inpatient Hospital Stay: Payer: Medicare HMO | Attending: Oncology | Admitting: Oncology

## 2018-07-21 ENCOUNTER — Encounter: Payer: Self-pay | Admitting: Oncology

## 2018-07-21 ENCOUNTER — Inpatient Hospital Stay: Payer: Medicare HMO

## 2018-07-21 DIAGNOSIS — C50812 Malignant neoplasm of overlapping sites of left female breast: Secondary | ICD-10-CM | POA: Insufficient documentation

## 2018-07-21 DIAGNOSIS — M8580 Other specified disorders of bone density and structure, unspecified site: Secondary | ICD-10-CM | POA: Insufficient documentation

## 2018-07-21 DIAGNOSIS — Z170 Estrogen receptor positive status [ER+]: Secondary | ICD-10-CM | POA: Insufficient documentation

## 2018-07-21 DIAGNOSIS — Z79811 Long term (current) use of aromatase inhibitors: Secondary | ICD-10-CM | POA: Insufficient documentation

## 2018-08-07 NOTE — Progress Notes (Signed)
[place]  Telephone:([phone] Fax:([phone]     ID: ATHZIRI FREUNDLICH OB: 09-26-1941  MR#: [id number]  YTK#:[id number]  PCP: Janith Lima, MD GYN:   SU: Excell Seltzer OTHER MD: Thea Silversmith  CHIEF COMPLAINT:left breast cancer  CURRENT TREATMENT: tamoxifen   BREAST CANCER HISTORY: From the original intake note:  Madolyn noted a mass in her left breast sometime in November. She thought about it but decided to do nothing and specifically she did not bring it to her primary care physician's attention. More recently, in February, she started having systemic symptoms including weight loss, drenching sweats, and overall weakness. She presented to the emergency room with these symptoms and was referred to Dr. Nena Jordan for evaluation. He noted a bilateral temporal artery prominence and tenderness and set the patient up for Temporal artery biopsy, performed to 17 2059 showing (SZ A [id number]) giant cell arteritis. She was started on prednisone and her symptoms have considerably improved. She is now on a taper.  As part of her evaluation she was noted to have a left breast mass and was referred to [facility] for evaluation. Ultrasound of the left breast mass to 20 02/27/2014 showed it to be largely cystic and it was aspirated the same day. The pathology ([id number] [id number]) showed this aggregated fragments of a papillary lesion without cytologic atypia. Accordingly the patient was referred to Dr. Excell Seltzer for excisional biopsy. This was performed 01/30/2014, and showed ([id number] [id number] an invasive ductal carcinoma with papillary features (invasive papillary carcinoma), grade 1, measuring 1.5 cm. Margins were negative but less than a millimeter there was no evidence of lymphovascular invasion. The cells were estrogen receptor 100% positive, progesterone receptor 11% positive, with an MIB-1 of 8% and no HER-2 amplification, the signals ratio being 1.56 and a copy number per cell 3.90.  The  patient's case was presented at the multidisciplinary breast cancer conference for age 77. The consensus at that time was that the patient would not need chemotherapy and also would not need sentinel lymph node biopsy as it would not change treatment.  The patient's subsequent history is as detailed below.   INTERVAL HISTORY: Kenadie returns today for follow-up and treatment of her estrogen receptor positive breast cancer. She continues on tamoxifen, with good tolerance. She has occasional mild hot flashes, that she aids with a fan at night. She also has increased vaginal discharge.   She had her annual mammogram in February at [place].  She was told that it was fine.  We are retrieving those results   REVIEW OF SYSTEMS: Janesa reports that she has never seen a rheumatologist for her temporal arthritis. She was taking prednisone, but after discontinuing, she developed significant pain in her legs, also her arms.  The arm pain is getting better. She prefers not to take prednisone again due to the potential side effects.  She struggles to get moving in the morning, but her pain improves after movement. She denies unusual headaches, visual changes, nausea, vomiting, or dizziness. There has been no unusual cough, phlegm production, or pleurisy. There has been no change in bowel or bladder habits. She denies unexplained fatigue or unexplained weight loss, bleeding, rash, or fever. A detailed review of systems was otherwise stable.    PAST MEDICAL HISTORY: Past Medical History:  Diagnosis Date  . Breast cancer ([place]) 01/03/14   left, 12 o'clock, upper outer  . Radiation 03/09/14-03/31/14   Left Breast   . Temporal arteritis ([place])  PAST SURGICAL HISTORY: Past Surgical History:  Procedure Laterality Date  . ARTERY BIOPSY Right 12/27/2013   Procedure: BIOPSY TEMPORAL ARTERY;  Surgeon: Gayland Curry, MD;  Location: [facility];  Service: General;  Laterality: Right;  . BREAST SURGERY  01/03/14   breast biopsy    . TUBAL LIGATION     many years ago    FAMILY HISTORY Family History  Problem Relation Age of Onset  . Diabetes Paternal Uncle   . Alzheimer's disease Mother    the patient's father died at the age of 27, from unclear causes. The patient's mother died at age 7. The patient had 2 brothers, no sisters. There is no breast or ovarian cancer in the family to her knowledge.  GYNECOLOGIC HISTORY:  Menarche age 70, first live birth age 77, she is [place] P2. She stopped having periods approximately 1983 and did not take hormone replacement. 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 She lives alone at home, with no pets. Her daughter Jonnie Finner lives in [place] .She is retired from [employer]. Her daughter Claris Gower lives in [place], [place], where she works in [employer]. The patient has no grandchildren. She is a Tourist information centre manager    ADVANCED DIRECTIVES: Not in place   HEALTH MAINTENANCE: Social History   Tobacco Use  . Smoking status: Never Smoker  . Smokeless tobacco: Never Used  Substance Use Topics  . Alcohol use: No    Comment: rare occaisons glass of wine  . Drug use: No     No Known Allergies  Current Outpatient Medications  Medication Sig Dispense Refill  . aspirin EC 81 MG tablet Take 162 mg by mouth at bedtime.    . Multiple Vitamins-Minerals (MULTIVITAMIN WITH MINERALS) tablet Take 1 tablet by mouth daily.    . predniSONE (DELTASONE) 1 MG tablet TAKE 2 TABLETS(2 MG) BY MOUTH DAILY WITH BREAKFAST 120 tablet 0  . tamoxifen (NOLVADEX) 20 MG tablet TAKE 1 TABLET(20 MG) BY MOUTH DAILY 30 tablet 3   No current facility-administered medications for this visit.     OBJECTIVE: Older white woman who appears stated age 77:   08/09/18 1129  BP: (!) 145/80  Pulse: 86  Resp: 18  Temp: 98.2 F (36.8 C)  SpO2: 100%     Body mass index  is 22.06 kg/m.      Sclerae unicteric, pupils round and equal Oropharynx clear and moist No cervical or supraclavicular adenopathy Lungs no rales or rhonchi Heart regular rate and rhythm Abd soft, nontender, positive bowel sounds MSK no focal spinal tenderness, no upper extremity lymphedema Neuro: nonfocal, well oriented, appropriate affect Breasts: The right breast is unremarkable.  The left breast is status post lumpectomy and radiation.  There is no evidence of local recurrence.  Both axillae are benign.  ECOG FS:1 - Symptomatic but completely ambulatory  LAB RESULTS:  CMP     Component Value Date/Time   NA 138 07/21/2017 1217   K 4.4 07/21/2017 1217   CL 103 04/02/2015 1633   CO2 26 07/21/2017 1217   GLUCOSE 121 07/21/2017 1217   BUN 12.3 07/21/2017 1217   CREATININE 1.0 07/21/2017 1217   CALCIUM 10.2 07/21/2017 1217   PROT 7.2 07/21/2017 1217   ALBUMIN 4.1 07/21/2017 1217   AST 17 07/21/2017 1217   ALT <6 07/21/2017 1217   ALKPHOS 71  07/21/2017 1217   BILITOT 0.56 07/21/2017 1217   GFRNONAA 83 (L) 12/26/2013 1600   GFRAA >90 12/26/2013 1600    I No results found for: SPEP  Lab Results  Component Value Date   WBC 5.0 08/09/2018   NEUTROABS 3.3 08/09/2018   HGB 12.7 08/09/2018   HCT 38.3 08/09/2018   MCV 92.0 08/09/2018   PLT 249 08/09/2018      Chemistry      Component Value Date/Time   NA 138 07/21/2017 1217   K 4.4 07/21/2017 1217   CL 103 04/02/2015 1633   CO2 26 07/21/2017 1217   BUN 12.3 07/21/2017 1217   CREATININE 1.0 07/21/2017 1217      Component Value Date/Time   CALCIUM 10.2 07/21/2017 1217   ALKPHOS 71 07/21/2017 1217   AST 17 07/21/2017 1217   ALT <6 07/21/2017 1217   BILITOT 0.56 07/21/2017 1217       No results found for: LABCA2  No components found for: LABCA125  No results for input(s): INR in the last 168 hours.  Urinalysis    Component Value Date/Time   COLORURINE YELLOW 12/26/2013 1551   APPEARANCEUR CLEAR  12/26/2013 1551   LABSPEC 1.009 12/26/2013 1551   PHURINE 8.0 12/26/2013 1551   GLUCOSEU NEGATIVE 12/26/2013 1551   HGBUR NEGATIVE 12/26/2013 [place] 12/26/2013 1551   BILIRUBINUR neg 12/26/2013 1343   KETONESUR 15 (A) 12/26/2013 1551   PROTEINUR NEGATIVE 12/26/2013 1551   UROBILINOGEN 1.0 12/26/2013 1551   NITRITE NEGATIVE 12/26/2013 [place] 12/26/2013 1551    STUDIES: Bilateral diagnostic mammography with tomography at [facility] 01/07/2018 from the breast density to be category C.  There was no evidence of malignancy.  ASSESSMENT: 77 y.o. [place] woman status post left breast upper outer quadrant excisional biopsy 01/30/2014 for a pT1c cN0, stage IA invasive ductal (papillary) carcinoma, estrogen receptor 100% positive, progesterone receptor 11% positive, with an MIB-1 of 8% and no HER-2 amplification.  1. Adjuvant radiation 03/09/2014 through 03/31/2014  2. Started Tamoxifen daily on 04/06/2014.  3. DEXA scan 02/14/2016 shows osteopenia with a T score of -2.2  PLAN: Carley is now a little over 4 years out from definitive surgery for breast cancer with no evidence of disease recurrence.  This is very favorable.  She is tolerating tamoxifen generally well and the plan will be to continue that through May 2020, to complete 5 years.  She tells me she really does not want to see a rheumatologist regarding her temporal arteritis and she also does not want to take anymore prednisone.  She does feel better with exercise and I suggested she tried tai chi but she does not like to get out of the house much and she cannot get here at 9:30 in the morning which is when we offer that class.  I did suggest she could do the exercises at home with a tape.  She will give that some thought.  Otherwise she will see me again next year for final visit.  She will have a repeat bone density before then.  She knows to call for any other issues that may develop before  then.  Poppy Mcafee, Virgie Dad, MD  08/09/18 11:50 AM Medical Oncology and Hematology [facility] [address] [place], [place] [address] Tel. [phone]    Fax. [phone]  Alice Rieger, am acting as scribe for Chauncey Cruel MD.  I, Lurline Del MD, have reviewed the above documentation  for accuracy and completeness, and I agree with the above.

## 2018-08-09 ENCOUNTER — Inpatient Hospital Stay: Payer: Medicare HMO

## 2018-08-09 ENCOUNTER — Inpatient Hospital Stay (HOSPITAL_BASED_OUTPATIENT_CLINIC_OR_DEPARTMENT_OTHER): Payer: Medicare HMO | Admitting: Oncology

## 2018-08-09 VITALS — BP 145/80 | HR 86 | Temp 98.2°F | Resp 18 | Ht 66.0 in | Wt 136.7 lb

## 2018-08-09 DIAGNOSIS — Z170 Estrogen receptor positive status [ER+]: Secondary | ICD-10-CM | POA: Diagnosis not present

## 2018-08-09 DIAGNOSIS — M8580 Other specified disorders of bone density and structure, unspecified site: Secondary | ICD-10-CM

## 2018-08-09 DIAGNOSIS — C50412 Malignant neoplasm of upper-outer quadrant of left female breast: Secondary | ICD-10-CM

## 2018-08-09 DIAGNOSIS — C50812 Malignant neoplasm of overlapping sites of left female breast: Secondary | ICD-10-CM | POA: Diagnosis not present

## 2018-08-09 DIAGNOSIS — Z79811 Long term (current) use of aromatase inhibitors: Secondary | ICD-10-CM | POA: Diagnosis not present

## 2018-08-09 LAB — CMP (CANCER CENTER ONLY)
ALBUMIN: 4 g/dL (ref 3.5–5.0)
ALK PHOS: 66 U/L (ref 38–126)
ALT: 9 U/L (ref 0–44)
AST: 17 U/L (ref 15–41)
Anion gap: 9 (ref 5–15)
BUN: 14 mg/dL (ref 8–23)
CALCIUM: 9.7 mg/dL (ref 8.9–10.3)
CHLORIDE: 105 mmol/L (ref 98–111)
CO2: 26 mmol/L (ref 22–32)
CREATININE: 0.96 mg/dL (ref 0.44–1.00)
GFR, Estimated: 56 mL/min — ABNORMAL LOW (ref >60)
Glucose, Bld: 109 mg/dL — ABNORMAL HIGH (ref 70–99)
Potassium: 4.4 mmol/L (ref 3.5–5.1)
SODIUM: 140 mmol/L (ref 135–145)
Total Bilirubin: 0.5 mg/dL (ref 0.3–1.2)
Total Protein: 7.1 g/dL (ref 6.5–8.1)

## 2018-08-09 LAB — CBC WITH DIFFERENTIAL (CANCER CENTER ONLY)
BASOS PCT: 1 %
Basophils Absolute: 0.1 10*3/uL (ref 0.0–0.1)
EOS ABS: 0.1 10*3/uL (ref 0.0–0.5)
EOS PCT: 2 %
HCT: 38.3 % (ref 34.8–46.6)
HEMOGLOBIN: 12.7 g/dL (ref 11.6–15.9)
LYMPHS ABS: 1 10*3/uL (ref 0.9–3.3)
Lymphocytes Relative: 21 %
MCH: 30.6 pg (ref 25.1–34.0)
MCHC: 33.3 g/dL (ref 31.5–36.0)
MCV: 92 fL (ref 79.5–101.0)
Monocytes Absolute: 0.5 10*3/uL (ref 0.1–0.9)
Monocytes Relative: 10 %
NEUTROS PCT: 66 %
Neutro Abs: 3.3 10*3/uL (ref 1.5–6.5)
PLATELETS: 249 10*3/uL (ref 145–400)
RBC: 4.16 MIL/uL (ref 3.70–5.45)
RDW: 13.7 % (ref 11.2–14.5)
WBC: 5 10*3/uL (ref 3.9–10.3)

## 2018-08-17 DIAGNOSIS — H43813 Vitreous degeneration, bilateral: Secondary | ICD-10-CM | POA: Diagnosis not present

## 2018-08-17 DIAGNOSIS — H40013 Open angle with borderline findings, low risk, bilateral: Secondary | ICD-10-CM | POA: Diagnosis not present

## 2018-08-17 DIAGNOSIS — H26493 Other secondary cataract, bilateral: Secondary | ICD-10-CM | POA: Diagnosis not present

## 2018-08-17 DIAGNOSIS — H04123 Dry eye syndrome of bilateral lacrimal glands: Secondary | ICD-10-CM | POA: Diagnosis not present

## 2018-09-01 ENCOUNTER — Other Ambulatory Visit: Payer: Self-pay | Admitting: Oncology

## 2018-09-08 DIAGNOSIS — R69 Illness, unspecified: Secondary | ICD-10-CM | POA: Diagnosis not present

## 2018-09-30 ENCOUNTER — Other Ambulatory Visit: Payer: Self-pay | Admitting: Oncology

## 2018-10-28 ENCOUNTER — Other Ambulatory Visit: Payer: Self-pay | Admitting: Oncology

## 2018-10-29 ENCOUNTER — Other Ambulatory Visit: Payer: Self-pay | Admitting: Oncology

## 2019-01-11 DIAGNOSIS — H43813 Vitreous degeneration, bilateral: Secondary | ICD-10-CM | POA: Diagnosis not present

## 2019-01-11 DIAGNOSIS — H40013 Open angle with borderline findings, low risk, bilateral: Secondary | ICD-10-CM | POA: Diagnosis not present

## 2019-01-11 DIAGNOSIS — Z853 Personal history of malignant neoplasm of breast: Secondary | ICD-10-CM | POA: Diagnosis not present

## 2019-01-11 DIAGNOSIS — H04123 Dry eye syndrome of bilateral lacrimal glands: Secondary | ICD-10-CM | POA: Diagnosis not present

## 2019-01-11 LAB — HM MAMMOGRAPHY

## 2019-03-30 ENCOUNTER — Telehealth: Payer: Self-pay | Admitting: Oncology

## 2019-03-30 NOTE — Telephone Encounter (Signed)
Rescheduled 5/26 appt per sch msg. calld and spoke with patient. Confirmed new date and time

## 2019-04-05 ENCOUNTER — Ambulatory Visit: Payer: Medicare HMO | Admitting: Oncology

## 2019-04-05 ENCOUNTER — Other Ambulatory Visit: Payer: Medicare HMO

## 2019-05-24 DIAGNOSIS — H26493 Other secondary cataract, bilateral: Secondary | ICD-10-CM | POA: Diagnosis not present

## 2019-05-24 DIAGNOSIS — H43813 Vitreous degeneration, bilateral: Secondary | ICD-10-CM | POA: Diagnosis not present

## 2019-05-24 DIAGNOSIS — H40013 Open angle with borderline findings, low risk, bilateral: Secondary | ICD-10-CM | POA: Diagnosis not present

## 2019-05-24 DIAGNOSIS — H04123 Dry eye syndrome of bilateral lacrimal glands: Secondary | ICD-10-CM | POA: Diagnosis not present

## 2019-07-19 ENCOUNTER — Other Ambulatory Visit: Payer: Self-pay | Admitting: *Deleted

## 2019-07-19 DIAGNOSIS — Z170 Estrogen receptor positive status [ER+]: Secondary | ICD-10-CM

## 2019-07-19 DIAGNOSIS — C50412 Malignant neoplasm of upper-outer quadrant of left female breast: Secondary | ICD-10-CM

## 2019-07-19 NOTE — Progress Notes (Signed)
[name]  [phone]) [phone] Fax:(336) [phone]     ID: LACINDA CURVIN OB: 1941/05/11  MR#: [id number]  POE#:[id number]  PCP: Janith Lima, MD GYN:   SU: Excell Seltzer OTHER MD: [place] breast cancer  CURRENT TREATMENT: Completed 5 years of tamoxifen   BREAST CANCER HISTORY: From the original intake note:  Lindsey [name] noted a mass in her left breast sometime in November. She thought about it but decided to do nothing and specifically she did not bring it to her primary care physician's attention. More recently, in February, she started having systemic symptoms including weight loss, drenching sweats, and overall weakness. She presented to the emergency room with these symptoms and was referred to Dr. Nena Jordan for evaluation. He noted a bilateral temporal artery prominence and tenderness and set the patient up for Temporal artery biopsy, performed to 17 2059 showing (SZ A [id number]) giant cell arteritis. She was started on prednisone and her symptoms have considerably improved. She is now on a taper.  As part of her evaluation she was noted to have a left breast mass and was referred to [facility] for evaluation. Ultrasound of the left breast mass to 20 02/27/2014 showed it to be largely cystic and it was aspirated the same day. The pathology (SAA [id number]) showed this aggregated fragments of a papillary lesion without cytologic atypia. Accordingly the patient was referred to Dr. Excell Seltzer for excisional biopsy. This was performed 01/30/2014, and showed ([id number] [id number] an invasive ductal carcinoma with papillary features (invasive papillary carcinoma), grade 1, measuring 1.5 cm. Margins were negative but less than a millimeter there was no evidence of lymphovascular invasion. The cells were estrogen receptor 100% positive, progesterone receptor 11% positive, with an MIB-1 of 8% and no HER-2 amplification, the signals ratio being 1.56 and a copy number  per cell 3.90.  The patient's case was presented at the multidisciplinary breast cancer conference for age 10. The consensus at that time was that the patient would not need chemotherapy and also would not need sentinel lymph node biopsy as it would not change treatment.  The patient's subsequent history is as detailed below.    INTERVAL HISTORY: Apphia returns today for follow-up and treatment of her estrogen receptor positive breast cancer. She was last seen here on 08/09/2018.   She continues on Tamoxifen.  She tolerates this well, with no significant side effects that she is aware of.  Since her last visit here, she underwent a digital diagnostic bilateral mammogram at [facility] on 01/11/2019 showing: Breast Density Category C. There is no mammographic evidence of malignancy.   REVIEW OF SYSTEMS: Lindsey [name] has had a rough year.  In January she found 1 of her brothers dead in his home.  This is the brother that she was more close to.  This was felt to be likely a cardiac death.  She was executor and that has kept her quite busy this past several months.  In addition of course she has had to isolate because of the pandemic.  All children do not live in town and her other brother who lives [place] of town she really is not close to.  She can go to church at present.  In short she is very isolated.  She is also not exercising regularly.  Her temporal arteritis is currently asymptomatic.  She took herself off prednisone about a year ago.  Aside from these issues a detailed review of systems today was stable   PAST MEDICAL  HISTORY: Past Medical History:  Diagnosis Date   Breast cancer ([place]) 01/03/14   left, 12 o'clock, upper outer   Radiation 03/09/14-03/31/14   Left Breast    Temporal arteritis ([place])     PAST SURGICAL HISTORY: Past Surgical History:  Procedure Laterality Date   ARTERY BIOPSY Right 12/27/2013   Procedure: BIOPSY TEMPORAL ARTERY;  Surgeon: Gayland Curry, MD;  Location: [facility];   Service: General;  Laterality: Right;   BREAST SURGERY  01/03/14   breast biopsy   TUBAL LIGATION     many years ago    FAMILY HISTORY Family History  Problem Relation Age of Onset   Diabetes Paternal Uncle    Alzheimer's disease Mother    the patient's father died at the age of 8, from unclear causes. The patient's mother died at age 32. The patient had 2 brothers, no sisters. There is no breast or ovarian cancer in the family to her knowledge.  GYNECOLOGIC HISTORY:  Menarche age 54, first live birth age 65, she is [place] P2. She stopped having periods approximately 1983 and did not take hormone replacement. 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 She lives alone at home, with no pets. Her daughter Lindsey [name] lives in [place] .She is retired from [employer]. Her daughter Lindsey [name] lives in [place], [place], where she works in [employer]. The patient has no grandchildren. She is a Tourist information centre manager    ADVANCED DIRECTIVES: Not in place   HEALTH MAINTENANCE: Social History   Tobacco Use   Smoking status: Never Smoker   Smokeless tobacco: Never Used  Substance Use Topics   Alcohol use: No    Comment: rare occaisons glass of wine   Drug use: No     No Known Allergies  Current Outpatient Medications  Medication Sig Dispense Refill   Multiple Vitamins-Minerals (MULTIVITAMIN WITH MINERALS) tablet Take 1 tablet by mouth daily.     No current facility-administered medications for this visit.     OBJECTIVE: Older white woman in no acute distress  Vitals:   07/20/19 1410  BP: (!) 144/84  Pulse: 89  Resp: 18  Temp: 97.8 F (36.6 C)  SpO2: 99%   Wt Readings from Last 3 Encounters:  07/20/19 134 lb 12.8 oz (61.1 kg)  08/09/18 136 lb 11.2 oz (62 kg)  07/21/17 134 lb 4.8 oz (60.9 kg)   Body mass index is 21.76 kg/m.    ECOG  FS:1 - Symptomatic but completely ambulatory  Ocular: Sclerae unicteric, pupils round and equal Ear-nose-throat: Wearing a mask Lymphatic: No cervical or supraclavicular adenopathy Lungs no rales or rhonchi Heart regular rate and rhythm Abd soft, nontender, positive bowel sounds MSK no focal spinal tenderness, no joint edema Neuro: non-focal, well-oriented, appropriate affect Breasts: The right breast is benign.  The left breast has undergone lumpectomy and radiation.  The cosmetic result is good.  There is no evidence of local recurrence.  Both axillae are benign.  LAB RESULTS:  CMP     Component Value Date/Time   NA 138 07/20/2019 1342   NA 138 07/21/2017 1217   K 4.4 07/20/2019 1342   K 4.4 07/21/2017 1217   CL 101 07/20/2019 1342   CO2 28 07/20/2019 1342   CO2 26 07/21/2017 1217   GLUCOSE 102 (H) 07/20/2019 1342   GLUCOSE 121 07/21/2017 1217  BUN 13 07/20/2019 1342   BUN 12.3 07/21/2017 1217   CREATININE 0.97 07/20/2019 1342   CREATININE 1.0 07/21/2017 1217   CALCIUM 9.5 07/20/2019 1342   CALCIUM 10.2 07/21/2017 1217   PROT 6.9 07/20/2019 1342   PROT 7.2 07/21/2017 1217   ALBUMIN 4.2 07/20/2019 1342   ALBUMIN 4.1 07/21/2017 1217   AST 20 07/20/2019 1342   AST 17 07/21/2017 1217   ALT <6 07/20/2019 1342   ALT <6 07/21/2017 1217   ALKPHOS 64 07/20/2019 1342   ALKPHOS 71 07/21/2017 1217   BILITOT 0.5 07/20/2019 1342   BILITOT 0.56 07/21/2017 1217   GFRNONAA 56 (L) 07/20/2019 1342   GFRAA >60 07/20/2019 1342    I No results found for: SPEP  Lab Results  Component Value Date   WBC 5.9 07/20/2019   NEUTROABS 3.7 07/20/2019   HGB 12.4 07/20/2019   HCT 38.3 07/20/2019   MCV 92.3 07/20/2019   PLT 238 07/20/2019      Chemistry      Component Value Date/Time   NA 138 07/20/2019 1342   NA 138 07/21/2017 1217   K 4.4 07/20/2019 1342   K 4.4 07/21/2017 1217   CL 101 07/20/2019 1342   CO2 28 07/20/2019 1342   CO2 26 07/21/2017 1217   BUN 13 07/20/2019 1342    BUN 12.3 07/21/2017 1217   CREATININE 0.97 07/20/2019 1342   CREATININE 1.0 07/21/2017 1217      Component Value Date/Time   CALCIUM 9.5 07/20/2019 1342   CALCIUM 10.2 07/21/2017 1217   ALKPHOS 64 07/20/2019 1342   ALKPHOS 71 07/21/2017 1217   AST 20 07/20/2019 1342   AST 17 07/21/2017 1217   ALT <6 07/20/2019 1342   ALT <6 07/21/2017 1217   BILITOT 0.5 07/20/2019 1342   BILITOT 0.56 07/21/2017 1217       No results found for: LABCA2  No components found for: LABCA125  No results for input(s): INR in the last 168 hours.  Urinalysis    Component Value Date/Time   COLORURINE YELLOW 12/26/2013 1551   APPEARANCEUR CLEAR 12/26/2013 1551   LABSPEC 1.009 12/26/2013 1551   PHURINE 8.0 12/26/2013 1551   GLUCOSEU NEGATIVE 12/26/2013 1551   HGBUR NEGATIVE 12/26/2013 [place] 12/26/2013 1551   BILIRUBINUR neg 12/26/2013 1343   KETONESUR 15 (A) 12/26/2013 1551   PROTEINUR NEGATIVE 12/26/2013 1551   UROBILINOGEN 1.0 12/26/2013 1551   NITRITE NEGATIVE 12/26/2013 1551   LEUKOCYTESUR NEGATIVE 12/26/2013 1551    STUDIES: No results found.   ASSESSMENT: 78 y.o. [place] woman status post left breast upper outer quadrant excisional biopsy 01/30/2014 for a pT1c cN0, stage IA invasive ductal (papillary) carcinoma, estrogen receptor 100% positive, progesterone receptor 11% positive, with an MIB-1 of 8% and no HER-2 amplification.  1. Adjuvant radiation 03/09/2014 through 03/31/2014  2. Started Tamoxifen May 2015, discontinued September 2020  3. DEXA scan 02/14/2016 shows osteopenia with a T score of -2.2  PLAN: Jena is now more than 5 years out from definitive surgery for her breast cancer with no evidence of disease recurrence.  This is very favorable.  We discussed the fact that continuing tamoxifen would not significantly reduce her risk of recurrence and I am very comfortable with her stopping it at this point.  We discussed "graduating" from follow-up  here.  She felt that she was being "tossed in the street".  I think she will be a very good candidate for our survivorship program and she  will see my 15 assistant March of next year as part of that program March of next year, after her next mammogram  We discussed her brother's sudden death in detail.  She has done a very good job of grieving and also taking care of the practical problems involved.  She is very isolated however.  This is very difficult time to try to build a new support group and set of friends but I think that would be very good for her if it were possible.  Perhaps when things open up a little she can participate in 1 of our support groups here  She knows to call for any other issue that may develop before her next visit.  Taliah Porche, Virgie Dad, MD  07/20/19 3:02 PM Medical Oncology and Hematology [facility] [address] [place], [place] [address] Tel. [phone]    Fax. [phone]  I, Jacqualyn Posey am acting as a Education administrator for Chauncey Cruel, MD.   I, Lurline Del MD, have reviewed the above documentation for accuracy and completeness, and I agree with the above.

## 2019-07-20 ENCOUNTER — Other Ambulatory Visit: Payer: Self-pay

## 2019-07-20 ENCOUNTER — Inpatient Hospital Stay (HOSPITAL_BASED_OUTPATIENT_CLINIC_OR_DEPARTMENT_OTHER): Payer: Medicare HMO | Admitting: Oncology

## 2019-07-20 ENCOUNTER — Inpatient Hospital Stay: Payer: Medicare HMO | Attending: Oncology

## 2019-07-20 VITALS — BP 144/84 | HR 89 | Temp 97.8°F | Resp 18 | Ht 66.0 in | Wt 134.8 lb

## 2019-07-20 DIAGNOSIS — C50412 Malignant neoplasm of upper-outer quadrant of left female breast: Secondary | ICD-10-CM | POA: Diagnosis not present

## 2019-07-20 DIAGNOSIS — Z170 Estrogen receptor positive status [ER+]: Secondary | ICD-10-CM

## 2019-07-20 DIAGNOSIS — C50912 Malignant neoplasm of unspecified site of left female breast: Secondary | ICD-10-CM | POA: Insufficient documentation

## 2019-07-20 DIAGNOSIS — M8580 Other specified disorders of bone density and structure, unspecified site: Secondary | ICD-10-CM | POA: Insufficient documentation

## 2019-07-20 DIAGNOSIS — Z79810 Long term (current) use of selective estrogen receptor modulators (SERMs): Secondary | ICD-10-CM | POA: Diagnosis not present

## 2019-07-20 LAB — CMP (CANCER CENTER ONLY)
ALT: <6 U/L (ref 0–44)
AST: 20 U/L (ref 15–41)
Albumin: 4.2 g/dL (ref 3.5–5.0)
Alkaline Phosphatase: 64 U/L (ref 38–126)
Anion gap: 9 (ref 5–15)
BUN: 13 mg/dL (ref 8–23)
CO2: 28 mmol/L (ref 22–32)
Calcium: 9.5 mg/dL (ref 8.9–10.3)
Chloride: 101 mmol/L (ref 98–111)
Creatinine: 0.97 mg/dL (ref 0.44–1.00)
GFR, Est AFR Am: >60 mL/min (ref >60)
GFR, Estimated: 56 mL/min — ABNORMAL LOW (ref >60)
Glucose, Bld: 102 mg/dL — ABNORMAL HIGH (ref 70–99)
Potassium: 4.4 mmol/L (ref 3.5–5.1)
Sodium: 138 mmol/L (ref 135–145)
Total Bilirubin: 0.5 mg/dL (ref 0.3–1.2)
Total Protein: 6.9 g/dL (ref 6.5–8.1)

## 2019-07-20 LAB — CBC WITH DIFFERENTIAL (CANCER CENTER ONLY)
Abs Immature Granulocytes: 0.01 10*3/uL (ref 0.00–0.07)
Basophils Absolute: 0 10*3/uL (ref 0.0–0.1)
Basophils Relative: 1 %
Eosinophils Absolute: 0.1 10*3/uL (ref 0.0–0.5)
Eosinophils Relative: 1 %
HCT: 38.3 % (ref 36.0–46.0)
Hemoglobin: 12.4 g/dL (ref 12.0–15.0)
Immature Granulocytes: 0 %
Lymphocytes Relative: 27 %
Lymphs Abs: 1.6 10*3/uL (ref 0.7–4.0)
MCH: 29.9 pg (ref 26.0–34.0)
MCHC: 32.4 g/dL (ref 30.0–36.0)
MCV: 92.3 fL (ref 80.0–100.0)
Monocytes Absolute: 0.5 10*3/uL (ref 0.1–1.0)
Monocytes Relative: 9 %
Neutro Abs: 3.7 10*3/uL (ref 1.7–7.7)
Neutrophils Relative %: 62 %
Platelet Count: 238 10*3/uL (ref 150–400)
RBC: 4.15 MIL/uL (ref 3.87–5.11)
RDW: 13.2 % (ref 11.5–15.5)
WBC Count: 5.9 10*3/uL (ref 4.0–10.5)
nRBC: 0 % (ref 0.0–0.2)

## 2019-07-21 ENCOUNTER — Telehealth: Payer: Self-pay | Admitting: Oncology

## 2019-07-21 NOTE — Telephone Encounter (Signed)
I talk with patient regarding schedule  

## 2019-08-02 DIAGNOSIS — R69 Illness, unspecified: Secondary | ICD-10-CM | POA: Diagnosis not present

## 2019-11-23 DIAGNOSIS — H43813 Vitreous degeneration, bilateral: Secondary | ICD-10-CM | POA: Diagnosis not present

## 2019-11-23 DIAGNOSIS — H26491 Other secondary cataract, right eye: Secondary | ICD-10-CM | POA: Diagnosis not present

## 2019-11-23 DIAGNOSIS — G43809 Other migraine, not intractable, without status migrainosus: Secondary | ICD-10-CM | POA: Diagnosis not present

## 2019-11-23 DIAGNOSIS — H52222 Regular astigmatism, left eye: Secondary | ICD-10-CM | POA: Diagnosis not present

## 2019-11-23 DIAGNOSIS — H04123 Dry eye syndrome of bilateral lacrimal glands: Secondary | ICD-10-CM | POA: Diagnosis not present

## 2019-12-07 DIAGNOSIS — H26492 Other secondary cataract, left eye: Secondary | ICD-10-CM | POA: Diagnosis not present

## 2019-12-20 ENCOUNTER — Ambulatory Visit: Payer: Medicare HMO | Admitting: Family Medicine

## 2019-12-20 ENCOUNTER — Encounter: Payer: Self-pay | Admitting: Family

## 2019-12-20 ENCOUNTER — Ambulatory Visit: Payer: Self-pay

## 2019-12-20 ENCOUNTER — Ambulatory Visit (HOSPITAL_COMMUNITY)
Admission: RE | Admit: 2019-12-20 | Discharge: 2019-12-20 | Disposition: A | Discharge status: Home / Self Care | Payer: Medicare HMO | Source: Ambulatory Visit | Attending: Internal Medicine | Admitting: Internal Medicine

## 2019-12-20 ENCOUNTER — Ambulatory Visit (INDEPENDENT_AMBULATORY_CARE_PROVIDER_SITE_OTHER): Payer: Medicare HMO

## 2019-12-20 ENCOUNTER — Ambulatory Visit (INDEPENDENT_AMBULATORY_CARE_PROVIDER_SITE_OTHER): Payer: Medicare HMO | Admitting: Family

## 2019-12-20 ENCOUNTER — Other Ambulatory Visit: Payer: Self-pay

## 2019-12-20 ENCOUNTER — Encounter: Payer: Self-pay | Admitting: Family Medicine

## 2019-12-20 VITALS — BP 124/80 | HR 110 | Temp 98.0°F | Ht 66.0 in | Wt 138.0 lb

## 2019-12-20 VITALS — BP 124/80 | HR 110 | Ht 66.0 in | Wt 138.0 lb

## 2019-12-20 DIAGNOSIS — M25571 Pain in right ankle and joints of right foot: Secondary | ICD-10-CM | POA: Diagnosis not present

## 2019-12-20 DIAGNOSIS — R000 Tachycardia, unspecified: Secondary | ICD-10-CM | POA: Diagnosis not present

## 2019-12-20 DIAGNOSIS — M19071 Primary osteoarthritis, right ankle and foot: Secondary | ICD-10-CM | POA: Diagnosis not present

## 2019-12-20 DIAGNOSIS — M7989 Other specified soft tissue disorders: Secondary | ICD-10-CM | POA: Diagnosis not present

## 2019-12-20 LAB — COMPREHENSIVE METABOLIC PANEL
ALT: 7 U/L (ref 0–35)
AST: 16 U/L (ref 0–37)
Albumin: 4.3 g/dL (ref 3.5–5.2)
Alkaline Phosphatase: 92 U/L (ref 39–117)
BUN: 15 mg/dL (ref 6–23)
CO2: 29 mEq/L (ref 19–32)
Calcium: 10.3 mg/dL (ref 8.4–10.5)
Chloride: 100 mEq/L (ref 96–112)
Creatinine, Ser: 0.89 mg/dL (ref 0.40–1.20)
GFR: 61.15 mL/min (ref >60.00)
Glucose, Bld: 110 mg/dL — ABNORMAL HIGH (ref 70–99)
Potassium: 4.1 mEq/L (ref 3.5–5.1)
Sodium: 137 mEq/L (ref 135–145)
Total Bilirubin: 0.8 mg/dL (ref 0.2–1.2)
Total Protein: 7.2 g/dL (ref 6.0–8.3)

## 2019-12-20 LAB — CBC WITH DIFFERENTIAL/PLATELET
Basophils Absolute: 0 10*3/uL (ref 0.0–0.1)
Basophils Relative: 0.5 % (ref 0.0–3.0)
Eosinophils Absolute: 0 10*3/uL (ref 0.0–0.7)
Eosinophils Relative: 0.4 % (ref 0.0–5.0)
HCT: 40.1 % (ref 36.0–46.0)
Hemoglobin: 13.2 g/dL (ref 12.0–15.0)
Lymphocytes Relative: 16 % (ref 12.0–46.0)
Lymphs Abs: 1.3 10*3/uL (ref 0.7–4.0)
MCHC: 33 g/dL (ref 30.0–36.0)
MCV: 88.7 fl (ref 78.0–100.0)
Monocytes Absolute: 0.9 10*3/uL (ref 0.1–1.0)
Monocytes Relative: 11.1 % (ref 3.0–12.0)
Neutro Abs: 5.7 10*3/uL (ref 1.4–7.7)
Neutrophils Relative %: 72 % (ref 43.0–77.0)
Platelets: 281 10*3/uL (ref 150.0–400.0)
RBC: 4.52 Mil/uL (ref 3.87–5.11)
RDW: 13.8 % (ref 11.5–15.5)
WBC: 7.9 10*3/uL (ref 4.0–10.5)

## 2019-12-20 LAB — URIC ACID: Uric Acid, Serum: 5.1 mg/dL (ref 2.4–7.0)

## 2019-12-20 LAB — TSH: TSH: 3.01 u[IU]/mL (ref 0.35–4.50)

## 2019-12-20 MED ORDER — TRAMADOL HCL 50 MG PO TABS: 50.0000 mg | ORAL_TABLET | Freq: Three times a day (TID) | ORAL | 0 refills | Status: DC | PRN

## 2019-12-20 NOTE — Progress Notes (Signed)
Subjective:    CC: R ankle pain  I, Lindsey [name], LAT, ATC, am serving as scribe for Dr. Lynne Leader.  HPI: Pt is a 79 y/o female presenting w/ c/o R ankle pain and swelling x 2-3 weeks w/ no known MOI.  Pain seemed to worsen over the weekend so she rested for the majority of the weekend.  She locates her pain to both the medial and lateral malleoli.  She rates her pain at a 6/10 and describes her pain as aching .  Radiating pain: Not really Swelling: Yes that worsened over the weekend Mechanical ankle symptoms: No Numbness/tingling: No Aggravating factors: When she first puts weight on her foot after resting at night or after sitting for a prolonged period of time Treatments tried: Rest and elevation  Diagnostic testing: R ankle and foot XR- 12/20/19  Pertinent review of Systems: No fevers or chills chest pain or palpitation.  Relevant historical information: History breast cancer currently in remission status post tamoxifen course.  History of polymyalgia rheumatica and temporal arteriolitis and hypertension.   Objective:    Vitals:   12/20/19 1241  BP: 124/80  Pulse: (!) 110  SpO2: 98%   General: Well Developed, well nourished, and in no acute distress.   MSK:  Right leg: Nonpitting edema right lower extremity from mid calf. Ankle swollen. Normal ankle motion. Tender to palpation mildly anterior medial and lateral ankle. Normal ankle strength dorsiflexion plantarflexion eversion inversion some pain with resisted dorsiflexion. Pulses cap refill and sensation are intact distally.  Lab and Radiology Results:  Limited musculoskeletal ultrasound right ankle Soft tissue hypoechoic fluid marbling medial and lateral ankle consistent with edema. Slight hypoechoic fluid tracking in peroneal tendon sheath distally and at posterior tibialis tendon sheath distally however tendons are intact. Small ankle effusion present on anterior lateral and anterior medial examination of  ankle joint. Normal bony structures. Impression: Edema.  Slight ankle effusion.   No results found for this or any previous visit (from the past 72 hour(s)). DG Ankle Complete Right  Result Date: 12/20/2019 CLINICAL DATA:  Right ankle and foot pain, worsening for the past 2 weeks. No known injury. EXAM: RIGHT ANKLE - COMPLETE 3+ VIEW COMPARISON:  Right foot radiographs obtained at the same time. FINDINGS: Moderate diffuse soft tissue swelling, most pronounced laterally. Minimal talotibial spur formation. Moderate inferior and mild posterior calcaneal enthesophyte formation. No fracture, dislocation or effusion seen. IMPRESSION: Moderate diffuse soft tissue swelling and minimal talotibial degenerative changes. Electronically Signed   By: Claudie Revering M.D.   On: 12/20/2019 10:19   DG Foot Complete Right  Result Date: 12/20/2019 CLINICAL DATA:  Right ankle and foot pain, worsening for the past 2 weeks. No known injury. EXAM: RIGHT FOOT COMPLETE - 3+ VIEW COMPARISON:  Right ankle radiographs obtained at the same time. FINDINGS: Mild 1st MTP joint degenerative changes. Moderate inferior and mild posterior calcaneal enthesophyte formation. IMPRESSION: Mild 1st MTP joint degenerative changes. Electronically Signed   By: Claudie Revering M.D.   On: 12/20/2019 10:17   I, Lynne Leader, personally (independently) visualized and performed the interpretation of the images attached in this note.    Impression and Recommendations:    Assessment and Plan: 79 y.o. female with right ankle pain and swelling.  Most the swelling seems to be soft tissue edema type.  She does have a small ankle effusion.  X-ray largely benign. Differential at this time includes DVT, gout, other rheumatologic issue, or radiographically occult ankle injury. She  already has CBC CMP TSH and uric acid pending from PCP. Anticipate results of these test. We will proceed with further work-up including vascular ultrasound to assess for DVT.  This  is especially important given her mild tachycardia on exam today. Assuming uric acid is normal and DVT negative return to clinic will proceed with ankle injection likely at that point. Limited tramadol for pain control today.  PDMP reviewed during this encounter. Orders Placed This Encounter  Procedures  . [place] LIMITED JOINT SPACE STRUCTURES LOW RIGHT(NO LINKED CHARGES)    Order Specific Question:   Reason for Exam (SYMPTOM  OR DIAGNOSIS REQUIRED)    Answer:   R ankle pain    Order Specific Question:   Preferred imaging location?    Answer:   [facility]   Meds ordered this encounter  Medications  . traMADol (ULTRAM) 50 MG tablet    Sig: Take 1 tablet (50 mg total) by mouth every 8 (eight) hours as needed for severe pain.    Dispense:  15 tablet    Refill:  0    Discussed warning signs or symptoms. Please see discharge instructions. Patient expresses understanding.   The above documentation has been reviewed and is accurate and complete Lynne Leader

## 2019-12-20 NOTE — Progress Notes (Signed)
Lindsey [name] is a 79 y.o. female with the following history as recorded in Epic[employer]:  Patient Active Problem List   Diagnosis Date Noted  . Essential hypertension 01/31/2016  . Estrogen deficiency 01/31/2016  . H/O systemic steroid therapy 01/31/2016  . BPPV (benign paroxysmal positional vertigo) 08/28/2015  . PMR (polymyalgia rheumatica) ([place]) 08/28/2015  . Hyperglycemia 03/02/2014  . Hyperlipidemia with target LDL less than 160 03/02/2014  . Routine general medical examination at a health care facility 03/02/2014  . Malignant neoplasm of upper-outer quadrant of left breast in female, estrogen receptor positive ([place]) 02/13/2014  . Temporal arteritis ([place]) 12/26/2013    Current Outpatient Medications  Medication Sig Dispense Refill  . Calcium Carbonate-Vitamin D (CALTRATE 600+D PO) Take by mouth.    . cholecalciferol (VITAMIN D3) 25 MCG (1000 UNIT) tablet Take 1,000 Units by mouth daily.    . Multiple Vitamins-Minerals (MULTIVITAMIN WITH MINERALS) tablet Take 1 tablet by mouth daily.     No current facility-administered medications for this visit.    Allergies: Patient has no known allergies.  Past Medical History:  Diagnosis Date  . Breast cancer ([place]) 01/03/14   left, 12 o'clock, upper outer  . Radiation 03/09/14-03/31/14   Left Breast   . Temporal arteritis ([place])     Past Surgical History:  Procedure Laterality Date  . ARTERY BIOPSY Right 12/27/2013   Procedure: BIOPSY TEMPORAL ARTERY;  Surgeon: Gayland Curry, MD;  Location: [facility];  Service: General;  Laterality: Right;  . BREAST SURGERY  01/03/14   breast biopsy  . TUBAL LIGATION     many years ago    Family History  Problem Relation Age of Onset  . Diabetes Paternal Uncle   . Alzheimer's disease Mother     Social History   Tobacco Use  . Smoking status: Never Smoker  . Smokeless tobacco: Never Used  Substance Use Topics  . Alcohol use: No    Comment: rare occaisons glass of wine    Subjective:  Patient has  not been in this office since March 2017; majority of her care has been managed by her oncologist;  Patient complaining of joint pain and swelling in her right ankle; notes the pain is better today; notes that symptoms have been present x 2-3 weeks but seemed much worse over the weekend; did rest over the weekend and seemed to improve; no icing, Advil or ACE wrap; no known injury or trauma to the area; no known history of gout;  Is surprised to see how elevated his pulse is today- no history of A. Fib; no new medications or supplements; no chest pain or shortness of breath; does mention that occasionally has "spells" when she feels weak but has never been seen or discussed symptoms with her provider.     Objective:  Vitals:   12/20/19 0909  BP: 124/80  Pulse: (!) 110  Temp: 98 F (36.7 C)  TempSrc: Oral  SpO2: 98%  Weight: 138 lb (62.6 kg)  Height: '5\' 6"'$  (1.676 m)    General: Well developed, well nourished, in no acute distress  Skin : Warm and dry.  Head: Normocephalic and atraumatic  Lungs: Respirations unlabored; clear to auscultation bilaterally without wheeze, rales, rhonchi  CVS exam: regular rhythm. +tachycardia present Musculoskeletal: No deformities; marked swelling noted over right ankle; swelling noted over right foot as well; Extremities: No edema, cyanosis, clubbing  Vessels: Symmetric bilaterally  Neurologic: Alert and oriented; speech intact; face symmetrical; moves all extremities well; CNII-XII intact  without focal deficit   Assessment:  1. Acute right ankle pain   2. Arthralgia of right ankle   3. Tachycardia     Plan:  1. Update x-rays today; no acute fracture seen; suspect bad sprain; discussed benefits of stabilizing boot or shoe and she will see sports medicine later today; Check uric acid level today; 3. Check CBC, CMP, TSH; EKG showed tachycardia but did not show A. Fib; I am still concerned that she may be having intermittent A. Fib however and will refer  her to cardiology for further evaluation.   This visit occurred during the SARS-CoV-2 public health emergency.  Safety protocols were in place, including screening questions prior to the visit, additional usage of staff PPE, and extensive cleaning of exam room while observing appropriate contact time as indicated for disinfecting solutions.      No follow-ups on file.  Orders Placed This Encounter  Procedures  . DG Foot Complete Right    Standing Status:   Future    Number of Occurrences:   1    Standing Expiration Date:   02/16/2021    Order Specific Question:   Reason for Exam (SYMPTOM  OR DIAGNOSIS REQUIRED)    Answer:   right ankle pain    Order Specific Question:   Preferred imaging location?    Answer:   [facility]    Order Specific Question:   Radiology Contrast Protocol - do NOT remove file path    Answer:   \\[email or web address]  . DG Ankle Complete Right    Standing Status:   Future    Number of Occurrences:   1    Standing Expiration Date:   02/16/2021    Order Specific Question:   Reason for Exam (SYMPTOM  OR DIAGNOSIS REQUIRED)    Answer:   right ankle/ foot pain    Order Specific Question:   Preferred imaging location?    Answer:   [facility]    Order Specific Question:   Radiology Contrast Protocol - do NOT remove file path    Answer:   \\[email or web address]  . CBC w/Diff  . Comp Met (CMET)  . TSH  . Uric acid    Requested Prescriptions    No prescriptions requested or ordered in this encounter

## 2019-12-20 NOTE — Addendum Note (Signed)
Addended by: Marcina Millard on: 12/20/2019 02:29 PM   Modules accepted: Orders

## 2019-12-20 NOTE — Patient Instructions (Signed)
You need to schedule an OV with Dr. Ronnald Ramp- you have not seen him since 2017;

## 2019-12-20 NOTE — Patient Instructions (Signed)
Thank you for coming in today. You should hear soon about Ultrasound to evaluate DVT.  Labs tomorrow will be helpful.  Use tramadol and the boot for comfort.  Anticipate recheck later this week or early next week.   I am looking for gout and DVT as cause of ankle swelling.  If both tests are negative I will plan for doing an ankle injection which will help.   Try using compression stocking.   Deep Vein Thrombosis  Deep vein thrombosis (DVT) is a condition in which a blood clot forms in a deep vein, such as a lower leg, thigh, or arm vein. A clot is blood that has thickened into a gel or solid. This condition is dangerous. It can lead to serious and even life-threatening complications if the clot travels to the lungs and causes a blockage (pulmonary embolism). It can also damage veins in the leg. This can result in leg pain, swelling, discoloration, and sores (post-thrombotic syndrome). What are the causes? This condition may be caused by:  A slowdown of blood flow.  Damage to a vein.  A condition that causes blood to clot more easily, such as an inherited clotting disorder. What increases the risk? The following factors may make you more likely to develop this condition:  Being overweight.  Being older, especially over age 49.  Sitting or lying down for more than four hours.  Being in the hospital.  Lack of physical activity (sedentary lifestyle).  Pregnancy, being in childbirth, or having recently given birth.  Taking medicines that contain estrogen, such as medicines to prevent pregnancy.  Smoking.  A history of any of the following: ? Blood clots or a blood clotting disease. ? Peripheral vascular disease. ? Inflammatory bowel disease. ? Cancer. ? Heart disease. ? Genetic conditions that affect how your blood clots, such as Factor V Leiden mutation. ? Neurological diseases that affect your legs (leg paresis). ? A recent injury, such as a car accident. ? Major or  lengthy surgery. ? A central line placed inside a large vein. What are the signs or symptoms? Symptoms of this condition include:  Swelling, pain, or tenderness in an arm or leg.  Warmth, redness, or discoloration in an arm or leg. If the clot is in your leg, symptoms may be more noticeable or worse when you stand or walk. Some people may not develop any symptoms. How is this diagnosed? This condition is diagnosed with:  A medical history and physical exam.  Tests, such as: ? Blood tests. These are done to check how well your blood clots. ? Ultrasound. This is done to check for clots. ? Venogram. For this test, contrast dye is injected into a vein and X-rays are taken to check for any clots. How is this treated? Treatment for this condition depends on:  The cause of your DVT.  Your risk for bleeding or developing more clots.  Any other medical conditions that you have. Treatment may include:  Taking a blood thinner (anticoagulant). This type of medicine prevents clots from forming. It may be taken by mouth, injected under the skin, or injected through an IV (catheter).  Injecting clot-dissolving medicines into the affected vein (catheter-directed thrombolysis).  Having surgery. Surgery may be done to: ? Remove the clot. ? Place a filter in a large vein to catch blood clots before they reach the lungs. Some treatments may be continued for up to six months. Follow these instructions at home: If you are taking blood thinners:  Take  the medicine exactly as told by your health care provider. Some blood thinners need to be taken at the same time every day. Do not skip a dose.  Talk with your health care provider before you take any medicines that contain aspirin or NSAIDs. These medicines increase your risk for dangerous bleeding.  Ask your health care provider about foods and drugs that could change the way the medicine works (may interact). Avoid those things if your health  care provider tells you to do so.  Blood thinners can cause easy bruising and may make it difficult to stop bleeding. Because of this: ? Be very careful when using knives, scissors, or other sharp objects. ? Use an electric razor instead of a blade. ? Avoid activities that could cause injury or bruising, and follow instructions about how to prevent falls.  Wear a medical alert bracelet or carry a card that lists what medicines you take. General instructions  Take over-the-counter and prescription medicines only as told by your health care provider.  Return to your normal activities as told by your health care provider. Ask your health care provider what activities are safe for you.  Wear compression stockings if recommended by your health care provider.  Keep all follow-up visits as told by your health care provider. This is important. How is this prevented? To lower your risk of developing this condition again:  For 30 or more minutes every day, do an activity that: ? Involves moving your arms and legs. ? Increases your heart rate.  When traveling for longer than four hours: ? Exercise your arms and legs every hour. ? Drink plenty of water. ? Avoid drinking alcohol.  Avoid sitting or lying for a long time without moving your legs.  If you have surgery or you are hospitalized, ask about ways to prevent blood clots. These may include taking frequent walks or using anticoagulants.  Stay at a healthy weight.  If you are a woman who is older than age 64, avoid unnecessary use of medicines that contain estrogen, such as some birth control pills.  Do not use any products that contain nicotine or tobacco, such as cigarettes and e-cigarettes. This is especially important if you take estrogen medicines. If you need help quitting, ask your health care provider. Contact a health care provider if:  You miss a dose of your blood thinner.  Your menstrual period is heavier than  usual.  You have unusual bruising. Get help right away if:  You have: ? New or increased pain, swelling, or redness in an arm or leg. ? Numbness or tingling in an arm or leg. ? Shortness of breath. ? Chest pain. ? A rapid or irregular heartbeat. ? A severe headache or confusion. ? A cut that will not stop bleeding.  There is blood in your vomit, stool, or urine.  You have a serious fall or accident, or you hit your head.  You feel light-headed or dizzy.  You cough up blood. These symptoms may represent a serious problem that is an emergency. Do not wait to see if the symptoms will go away. Get medical help right away. Call your local emergency services (911 in the [place] Do not drive yourself to the hospital. Summary  Deep vein thrombosis (DVT) is a condition in which a blood clot forms in a deep vein, such as a lower leg, thigh, or arm vein.  Symptoms can include swelling, warmth, pain, and redness in your leg or arm.  This condition  may be treated with a blood thinner (anticoagulant medicine), medicine that is injected to dissolve blood clots,compression stockings, or surgery.  If you are prescribed blood thinners, take them exactly as told. This information is not intended to replace advice given to you by your health care provider. Make sure you discuss any questions you have with your health care provider. Document Revised: 10/09/2017 Document Reviewed: 03/27/2017 Elsevier Patient Education  [place].    Gout  Gout is painful swelling of your joints. Gout is a type of arthritis. It is caused by having too much uric acid in your body. Uric acid is a chemical that is made when your body breaks down substances called purines. If your body has too much uric acid, sharp crystals can form and build up in your joints. This causes pain and swelling. Gout attacks can happen quickly and be very painful (acute gout). Over time, the attacks can affect more joints and happen  more often (chronic gout). What are the causes?  Too much uric acid in your blood. This can happen because: ? Your kidneys do not remove enough uric acid from your blood. ? Your body makes too much uric acid. ? You eat too many foods that are high in purines. These foods include organ meats, some seafood, and beer.  Trauma or stress. What increases the risk?  Having a family history of gout.  Being female and middle-aged.  Being female and having gone through menopause.  Being very overweight (obese).  Drinking alcohol, especially beer.  Not having enough water in the body (being dehydrated).  Losing weight too quickly.  Having an organ transplant.  Having lead poisoning.  Taking certain medicines.  Having kidney disease.  Having a skin condition called psoriasis. What are the signs or symptoms? An attack of acute gout usually happens in just one joint. The most common place is the big toe. Attacks often start at night. Other joints that may be affected include joints of the feet, ankle, knee, fingers, wrist, or elbow. Symptoms of an attack may include:  Very bad pain.  Warmth.  Swelling.  Stiffness.  Shiny, red, or purple skin.  Tenderness. The affected joint may be very painful to touch.  Chills and fever. Chronic gout may cause symptoms more often. More joints may be involved. You may also have white or yellow lumps (tophi) on your hands or feet or in other areas near your joints. How is this treated?  Treatment for this condition has two phases: treating an acute attack and preventing future attacks.  Acute gout treatment may include: ? NSAIDs. ? Steroids. These are taken by mouth or injected into a joint. ? Colchicine. This medicine relieves pain and swelling. It can be given by mouth or through an IV tube.  Preventive treatment may include: ? Taking small doses of NSAIDs or colchicine daily. ? Using a medicine that reduces uric acid levels in your  blood. ? Making changes to your diet. You may need to see a food expert (dietitian) about what to eat and drink to prevent gout. Follow these instructions at home: During a gout attack   If told, put ice on the painful area: ? Put ice in a plastic bag. ? Place a towel between your skin and the bag. ? Leave the ice on for 20 minutes, 2-3 times a day.  Raise (elevate) the painful joint above the level of your heart as often as you can.  Rest the joint as much  as possible. If the joint is in your leg, you may be given crutches.  Follow instructions from your doctor about what you cannot eat or drink. Avoiding future gout attacks  Eat a low-purine diet. Avoid foods and drinks such as: ? Liver. ? Kidney. ? Anchovies. ? Asparagus. ? Herring. ? Mushrooms. ? Mussels. ? Beer.  Stay at a healthy weight. If you want to lose weight, talk with your doctor. Do not lose weight too fast.  Start or continue an exercise plan as told by your doctor. Eating and drinking  Drink enough fluids to keep your pee (urine) pale yellow.  If you drink alcohol: ? Limit how much you use to:  0-1 drink a day for women.  0-2 drinks a day for men. ? Be aware of how much alcohol is in your drink. In the [place] one drink equals one 12 oz bottle of beer (355 mL), one 5 oz glass of wine (148 mL), or one 1 oz glass of hard liquor (44 mL). General instructions  Take over-the-counter and prescription medicines only as told by your doctor.  Do not drive or use heavy machinery while taking prescription pain medicine.  Return to your normal activities as told by your doctor. Ask your doctor what activities are safe for you.  Keep all follow-up visits as told by your doctor. This is important. Contact a doctor if:  You have another gout attack.  You still have symptoms of a gout attack after 10 days of treatment.  You have problems (side effects) because of your medicines.  You have chills or a  fever.  You have burning pain when you pee (urinate).  You have pain in your lower back or belly. Get help right away if:  You have very bad pain.  Your pain cannot be controlled.  You cannot pee. Summary  Gout is painful swelling of the joints.  The most common site of pain is the big toe, but it can affect other joints.  Medicines and avoiding some foods can help to prevent and treat gout attacks. This information is not intended to replace advice given to you by your health care provider. Make sure you discuss any questions you have with your health care provider. Document Revised: 05/19/2018 Document Reviewed: 05/19/2018 Elsevier Patient Education  [place].

## 2019-12-21 NOTE — Progress Notes (Signed)
No evidence of DVT on vascular ultrasound.  I also reviewed labs and it does not look like you have gout.  Recommend return to clinic for ankle injection.

## 2020-01-03 ENCOUNTER — Encounter: Payer: Self-pay | Admitting: *Deleted

## 2020-01-03 ENCOUNTER — Encounter: Payer: Self-pay | Admitting: Cardiology

## 2020-01-03 ENCOUNTER — Other Ambulatory Visit: Payer: Self-pay

## 2020-01-03 ENCOUNTER — Ambulatory Visit: Payer: Medicare HMO | Admitting: Cardiology

## 2020-01-03 VITALS — BP 140/78 | HR 110 | Temp 97.1°F | Ht 66.0 in | Wt 136.4 lb

## 2020-01-03 DIAGNOSIS — Z7189 Other specified counseling: Secondary | ICD-10-CM

## 2020-01-03 DIAGNOSIS — R000 Tachycardia, unspecified: Secondary | ICD-10-CM

## 2020-01-03 NOTE — Progress Notes (Signed)
Cardiology Office Note:    Date:  01/03/2020   ID:  Lindsey [name], DOB 1940-12-06, MRN [id number]  PCP:  Janith Lima, MD  Cardiologist:  Buford Dresser, MD  Referring MD: Marrian Salvage,*   CC: new patient evaluation for tachycardia  History of Present Illness:    Lindsey [name] is a 79 y.o. female with a hx of hypertension, temporal arteritis, polymyalgia rheumatica, breast cancer who is seen as a new consult at the request of Marrian Salvage,* for the evaluation and management of tachycardia.  Most recent note dated 12/20/19 from Marrian Salvage personally reviewed. Incidentally noted to have heart rate of 110 bpm at that visit. ECG personally reviewed, shows sinus tachycardia at 102 bpm.  Tachycardia: -Initial onset: first time she was told her heart rate was fast. Doesn't have BP cuff, activity tracker, etc at home, has never tracked her heart rate -Frequency/Duration: random episodes where she feels like she has to "hit the recliner" and rest. Had an episode this AM when her heart was racing. Lasted about 15 minutes.  -Associated symptoms: none -Aggravating/alleviating factors: improves with resting. No clear triggers. -Syncope/near syncope: none -Prior cardiac history: none -Prior ECG:  Sinus tachycardia -Prior workup: none -Prior treatment: none -Possible medication interactions: none -Caffeine: 1-2 cups of coffee/day. Rarely drinks tea.  -Alcohol: exceedingly rare, holidays only, otherwise none -Tobacco: none -OTC supplements: reviewed today, no clear associated medications -Comorbidities: temporal arteritis/PMR has been stable, no recent need for steroids -Exercise level: minimally active. Used to walk, started having joint pain which limited her. Does climb stairs at home, vacuum. No specific limitations.  -Labs: TSH, kidney function/electrolytes, CBC reviewed from 12/20/19 -Cardiac ROS: no chest pain, no shortness of breath, no PND, no  orthopnea, no LE edema. -Family history: father died age 67, no prior heart issues, but on death certificate was listed as ischemic heart disease. Brother died 2018/12/13 at age 33, found down, no prior history of heart disease, listed as myocardial infarction on death certificate.  Past Medical History:  Diagnosis Date  . Breast cancer ([facility]) 01/03/14   left, 12 o'clock, upper outer  . Radiation 03/09/14-03/31/14   Left Breast   . Temporal arteritis ([facility])     Past Surgical History:  Procedure Laterality Date  . ARTERY BIOPSY Right 12/27/2013   Procedure: BIOPSY TEMPORAL ARTERY;  Surgeon: Gayland Curry, MD;  Location: [facility];  Service: General;  Laterality: Right;  . BREAST SURGERY  01/03/14   breast biopsy  . TUBAL LIGATION     many years ago    Current Medications: Current Outpatient Medications on File Prior to Visit  Medication Sig  . Calcium Carbonate-Vitamin D (CALTRATE 600+D PO) Take by mouth.  . cholecalciferol (VITAMIN D3) 25 MCG (1000 UNIT) tablet Take 1,000 Units by mouth daily.  . Multiple Vitamins-Minerals (MULTIVITAMIN WITH MINERALS) tablet Take 1 tablet by mouth daily.   No current facility-administered medications on file prior to visit.     Allergies:   Patient has no known allergies.   Social History   Tobacco Use  . Smoking status: Never Smoker  . Smokeless tobacco: Never Used  Substance Use Topics  . Alcohol use: No    Comment: rare occaisons glass of wine  . Drug use: No    Family History: family history includes Alzheimer's disease in her mother; Diabetes in her paternal uncle.  ROS:   Please see the history of present illness.  Additional pertinent ROS: Constitutional: Negative for  chills, fever, night sweats, unintentional weight loss  HENT: Negative for ear pain and hearing loss.   Eyes: Negative for loss of vision and eye pain.  Respiratory: Negative for cough, sputum, wheezing.   Cardiovascular: See HPI. Gastrointestinal: Negative for abdominal  pain, melena, and hematochezia.  Genitourinary: Negative for dysuria and hematuria.  Musculoskeletal: Negative for falls and myalgias.  Skin: Negative for itching and rash.  Neurological: Negative for focal weakness, focal sensory changes and loss of consciousness.  Endo/Heme/Allergies: Does not bruise/bleed easily.     EKGs/Labs/Other Studies Reviewed:    The following studies were reviewed today: No prior cardiac studies  EKG:  EKG is personally reviewed.  The ekg ordered today demonstrates sinus tachycardia at 110 bpm  Recent Labs: 12/20/2019: ALT 7; BUN 15; Creatinine, Ser 0.89; Hemoglobin 13.2; Platelets 281.0; Potassium 4.1; Sodium 137; TSH 3.01  Recent Lipid Panel    Component Value Date/Time   CHOL 175 04/02/2015 1633   TRIG 125.0 04/02/2015 1633   HDL 59.70 04/02/2015 1633   CHOLHDL 3 04/02/2015 1633   VLDL 25.0 04/02/2015 1633   LDLCALC 90 04/02/2015 1633    Physical Exam:    VS:  BP 140/78   Pulse (!) 110   Temp (!) 97.1 F (36.2 C)   Ht 5\' 6"  (1.676 m)   Wt 136 lb 6.4 oz (61.9 kg)   SpO2 98%   BMI 22.02 kg/m     Wt Readings from Last 3 Encounters:  01/03/20 136 lb 6.4 oz (61.9 kg)  12/20/19 138 lb (62.6 kg)  12/20/19 138 lb (62.6 kg)    GEN: Well nourished, well developed in no acute distress HEENT: Normal, moist mucous membranes NECK: No JVD CARDIAC: regular rhythm, normal S1 and S2, no rubs or gallops. No murmurs. VASCULAR: Radial and DP pulses 2+ bilaterally. No carotid bruits RESPIRATORY:  Clear to auscultation without rales, wheezing or rhonchi  ABDOMEN: Soft, non-tender, non-distended MUSCULOSKELETAL:  Ambulates independently SKIN: Warm and dry, no edema NEUROLOGIC:  Alert and oriented x 3. No focal neuro deficits noted. PSYCHIATRIC:  Normal affect    ASSESSMENT:    1. Tachycardia   2. Sinus tachycardia by electrocardiogram   3. Cardiac risk counseling   4. Counseling on health promotion and disease prevention    PLAN:    Tachycardia:  has ECG today and prior with sinus tachycardia, but cannot exclude arrhythmia -we discussed the potential causes of fast heart rates and palpitations today. Reviewed the normal electrical system of the heart. Reviewed the role of the sinus node. Reviewed the balance between resting (vagal) tone and fight or flight nervous system input. Reviewed how exercise improves vagal tone and lowers resting heart rate. Reviewed that sinus tachycardia, or elevated sinus rate, is usually secondary to something else in the body. This can include pain, stress, infection, anxiety, hormone imbalance, low blood counts, etc. Discussed that we do not typically treat sinus tachycardia by itself, and instead the focus is on finding what is driving the heart rate and treating that. We discussed that there can be other rhythm issues, from either the top or bottom of the heart, that are abnormal rhythms. Discussed how we evaluate for these. -7 day Zio monitor to exclude significant arrhythmia  Cardiac risk counseling and prevention recommendations: -recommend heart healthy/Mediterranean diet, with whole grains, fruits, vegetable, fish, lean meats, nuts, and olive oil. Limit salt. -recommend moderate walking, 3-5 times/week for 30-50 minutes each session. Aim for at least 150 minutes.week. Goal should be pace  of 3 miles/hours, or walking 1.5 miles in 30 minutes -recommend avoidance of tobacco products. Avoid excess alcohol.  Plan for follow up: if monitor without significant arrhythmia, can follow up as needed. If arrhythmia noted, will bring back to discuss next steps  Buford Dresser, MD, PhD [place]  [facility]    Medication Adjustments/Labs and Tests Ordered: Current medicines are reviewed at length with the patient today.  Concerns regarding medicines are outlined above.  Orders Placed This Encounter  Procedures  . LONG TERM MONITOR (3-14 DAYS)  . EKG 12-Lead   No orders of the defined types were  placed in this encounter.   Patient Instructions  Medication Instructions:  Your Physician recommend you continue on your current medication as directed.    *If you need a refill on your cardiac medications before your next appointment, please call your pharmacy*  Lab Work: None  Testing/Procedures: Our physician has recommended that you wear an [place] monitor. The Zio patch cardiac monitor continuously records heart rhythm data for up to 14 days, this is for patients being evaluated for multiple types heart rhythms. For the first 24 hours post application, please avoid getting the Zio monitor wet in the shower or by excessive sweating during exercise. After that, feel free to carry on with regular activities. Keep soaps and lotions away from the ZIO XT Patch.   Someone from our office will call to mail monitor.       Follow-Up: At [facility] you and your health needs are our priority.  As part of our continuing mission to provide you with exceptional heart care, we have created designated Provider Care Teams.  These Care Teams include your primary Cardiologist (physician) and Advanced Practice Providers (APPs -  Physician Assistants and Nurse Practitioners) who all work together to provide you with the care you need, when you need it.  Your next appointment:   As needed  The format for your next appointment:   Either In Person or Virtual  Provider:   Buford Dresser, MD  [place] Instructions   Your physician has requested you wear your ZIO patch monitor___7____days.   This is a single patch monitor.  [employer] supplies one patch monitor per enrollment.  Additional stickers are not available.   Please do not apply patch if you will be having a Nuclear Stress Test, Echocardiogram, Cardiac CT, MRI, or Chest Xray during the time frame you would be wearing the monitor. The patch cannot be worn during these tests.  You cannot remove and re-apply the  ZIO XT patch monitor.   Your ZIO patch monitor will be sent USPS Priority mail from [facility] directly to your home address. The monitor may also be mailed to a PO BOX if home delivery is not available.   It may take 3-5 days to receive your monitor after you have been enrolled.   Once you have received you monitor, please review enclosed instructions.  Your monitor has already been registered assigning a specific monitor serial # to you.   Applying the monitor   Shave hair from upper left chest.   Hold abrader disc by orange tab.  Rub abrader in 40 strokes over left upper chest as indicated in your monitor instructions.   Clean area with 4 enclosed alcohol pads .  Use all pads to assure are is cleaned thoroughly.  Let dry.   Apply patch as indicated in monitor instructions.  Patch will be place  under collarbone on left side of chest with arrow pointing upward.   Rub patch adhesive wings for 2 minutes.Remove white label marked "1".  Remove white label marked "2".  Rub patch adhesive wings for 2 additional minutes.   While looking in a mirror, press and release button in center of patch.  A small green light will flash 3-4 times .  This will be your only indicator the monitor has been turned on.     Do not shower for the first 24 hours.  You may shower after the first 24 hours.   Press button if you feel a symptom. You will hear a small click.  Record Date, Time and Symptom in the Patient Log Book.   When you are ready to remove patch, follow instructions on last 2 pages of Patient Log Book.  Stick patch monitor onto last page of Patient Log Book.   Place Patient Log Book in [place] box.  Use locking tab on box and tape box closed securely.  The [employer] and [employer] has [employer] on it.  Please place in mailbox as soon as possible.  Your physician should have your test results approximately 7 days after the monitor has been mailed back to [facility].   Call [place] at [phone] if you have questions regarding your ZIO XT patch monitor.  Call them immediately if you see an orange light blinking on your monitor.   If your monitor falls off in less than 4 days contact our Monitor department at [phone].  If your monitor becomes loose or falls off after 4 days call [employer] at [phone] for suggestions on securing your monitor.         Signed, Buford Dresser, MD PhD 01/03/2020 12:59 PM    [place]

## 2020-01-03 NOTE — Progress Notes (Signed)
Patient ID: Lindsey [name], female   DOB: 10/30/1941, 79 y.o.   MRN: [id number] Patient enrolled for [employer] to mail a 7 day ZIO XT long term holter monitor to her home.

## 2020-01-03 NOTE — Patient Instructions (Signed)
Medication Instructions:  Your Physician recommend you continue on your current medication as directed.    *If you need a refill on your cardiac medications before your next appointment, please call your pharmacy*  Lab Work: None  Testing/Procedures: Our physician has recommended that you wear an [place] monitor. The Zio patch cardiac monitor continuously records heart rhythm data for up to 14 days, this is for patients being evaluated for multiple types heart rhythms. For the first 24 hours post application, please avoid getting the Zio monitor wet in the shower or by excessive sweating during exercise. After that, feel free to carry on with regular activities. Keep soaps and lotions away from the ZIO XT Patch.   Someone from our office will call to mail monitor.       Follow-Up: At [facility], you and your health needs are our priority.  As part of our continuing mission to provide you with exceptional heart care, we have created designated Provider Care Teams.  These Care Teams include your primary Cardiologist (physician) and Advanced Practice Providers (APPs -  Physician Assistants and Nurse Practitioners) who all work together to provide you with the care you need, when you need it.  Your next appointment:   As needed  The format for your next appointment:   Either In Person or Virtual  Provider:   Buford Dresser, MD  [place] Instructions   Your physician has requested you wear your ZIO patch monitor___7____days.   This is a single patch monitor.  [employer] supplies one patch monitor per enrollment.  Additional stickers are not available.   Please do not apply patch if you will be having a Nuclear Stress Test, Echocardiogram, Cardiac CT, MRI, or Chest Xray during the time frame you would be wearing the monitor. The patch cannot be worn during these tests.  You cannot remove and re-apply the ZIO XT patch monitor.   Your ZIO patch monitor will  be sent USPS Priority mail from [facility] directly to your home address. The monitor may also be mailed to a PO BOX if home delivery is not available.   It may take 3-5 days to receive your monitor after you have been enrolled.   Once you have received you monitor, please review enclosed instructions.  Your monitor has already been registered assigning a specific monitor serial # to you.   Applying the monitor   Shave hair from upper left chest.   Hold abrader disc by orange tab.  Rub abrader in 40 strokes over left upper chest as indicated in your monitor instructions.   Clean area with 4 enclosed alcohol pads .  Use all pads to assure are is cleaned thoroughly.  Let dry.   Apply patch as indicated in monitor instructions.  Patch will be place under collarbone on left side of chest with arrow pointing upward.   Rub patch adhesive wings for 2 minutes.Remove white label marked "1".  Remove white label marked "2".  Rub patch adhesive wings for 2 additional minutes.   While looking in a mirror, press and release button in center of patch.  A small green light will flash 3-4 times .  This will be your only indicator the monitor has been turned on.     Do not shower for the first 24 hours.  You may shower after the first 24 hours.   Press button if you feel a symptom. You will hear a small click.  Record  Date, Time and Symptom in the Patient Log Book.   When you are ready to remove patch, follow instructions on last 2 pages of Patient Log Book.  Stick patch monitor onto last page of Patient Log Book.   Place Patient Log Book in [place] box.  Use locking tab on box and tape box closed securely.  The [employer] and [employer] has [employer] on it.  Please place in mailbox as soon as possible.  Your physician should have your test results approximately 7 days after the monitor has been mailed back to [facility].   Call [place] at [phone] if you have questions  regarding your ZIO XT patch monitor.  Call them immediately if you see an orange light blinking on your monitor.   If your monitor falls off in less than 4 days contact our Monitor department at [phone].  If your monitor becomes loose or falls off after 4 days call [employer] at [phone] for suggestions on securing your monitor.

## 2020-01-05 ENCOUNTER — Telehealth: Payer: Self-pay | Admitting: Adult Health

## 2020-01-05 NOTE — Telephone Encounter (Signed)
Rescheduled 3/22 appt to 3/31 due to provider PAL day. Pt is aware of appt change.

## 2020-01-06 ENCOUNTER — Ambulatory Visit (INDEPENDENT_AMBULATORY_CARE_PROVIDER_SITE_OTHER): Payer: Medicare HMO

## 2020-01-06 DIAGNOSIS — R000 Tachycardia, unspecified: Secondary | ICD-10-CM | POA: Diagnosis not present

## 2020-01-17 ENCOUNTER — Encounter: Payer: Self-pay | Admitting: Oncology

## 2020-01-17 DIAGNOSIS — Z1231 Encounter for screening mammogram for malignant neoplasm of breast: Secondary | ICD-10-CM | POA: Diagnosis not present

## 2020-01-21 DIAGNOSIS — R000 Tachycardia, unspecified: Secondary | ICD-10-CM | POA: Diagnosis not present

## 2020-01-30 ENCOUNTER — Encounter: Payer: Medicare HMO | Admitting: Adult Health

## 2020-01-30 ENCOUNTER — Other Ambulatory Visit: Payer: Medicare HMO

## 2020-02-08 ENCOUNTER — Inpatient Hospital Stay: Payer: Medicare HMO | Admitting: Adult Health

## 2020-02-08 ENCOUNTER — Inpatient Hospital Stay: Payer: Medicare HMO | Attending: Adult Health

## 2020-02-08 ENCOUNTER — Other Ambulatory Visit: Payer: Self-pay

## 2020-02-08 VITALS — BP 145/84 | HR 97 | Temp 98.0°F | Resp 20 | Ht 66.0 in | Wt 135.3 lb

## 2020-02-08 DIAGNOSIS — C50412 Malignant neoplasm of upper-outer quadrant of left female breast: Secondary | ICD-10-CM

## 2020-02-08 DIAGNOSIS — Z853 Personal history of malignant neoplasm of breast: Secondary | ICD-10-CM | POA: Diagnosis not present

## 2020-02-08 DIAGNOSIS — Z170 Estrogen receptor positive status [ER+]: Secondary | ICD-10-CM

## 2020-02-08 DIAGNOSIS — M8580 Other specified disorders of bone density and structure, unspecified site: Secondary | ICD-10-CM | POA: Diagnosis not present

## 2020-02-08 LAB — CBC WITH DIFFERENTIAL/PLATELET
Abs Immature Granulocytes: 0.02 10*3/uL (ref 0.00–0.07)
Basophils Absolute: 0 10*3/uL (ref 0.0–0.1)
Basophils Relative: 1 %
Eosinophils Absolute: 0.1 10*3/uL (ref 0.0–0.5)
Eosinophils Relative: 2 %
HCT: 40.7 % (ref 36.0–46.0)
Hemoglobin: 13.1 g/dL (ref 12.0–15.0)
Immature Granulocytes: 0 %
Lymphocytes Relative: 20 %
Lymphs Abs: 1.2 10*3/uL (ref 0.7–4.0)
MCH: 29.2 pg (ref 26.0–34.0)
MCHC: 32.2 g/dL (ref 30.0–36.0)
MCV: 90.8 fL (ref 80.0–100.0)
Monocytes Absolute: 0.7 10*3/uL (ref 0.1–1.0)
Monocytes Relative: 11 %
Neutro Abs: 4.1 10*3/uL (ref 1.7–7.7)
Neutrophils Relative %: 66 %
Platelets: 305 10*3/uL (ref 150–400)
RBC: 4.48 MIL/uL (ref 3.87–5.11)
RDW: 13.2 % (ref 11.5–15.5)
WBC: 6.2 10*3/uL (ref 4.0–10.5)
nRBC: 0 % (ref 0.0–0.2)

## 2020-02-08 LAB — COMPREHENSIVE METABOLIC PANEL
ALT: 14 U/L (ref 0–44)
AST: 27 U/L (ref 15–41)
Albumin: 3.8 g/dL (ref 3.5–5.0)
Alkaline Phosphatase: 124 U/L (ref 38–126)
Anion gap: 9 (ref 5–15)
BUN: 10 mg/dL (ref 8–23)
CO2: 27 mmol/L (ref 22–32)
Calcium: 10 mg/dL (ref 8.9–10.3)
Chloride: 102 mmol/L (ref 98–111)
Creatinine, Ser: 0.87 mg/dL (ref 0.44–1.00)
GFR calc Af Amer: >60 mL/min (ref >60)
GFR calc non Af Amer: >60 mL/min (ref >60)
Glucose, Bld: 107 mg/dL — ABNORMAL HIGH (ref 70–99)
Potassium: 4.3 mmol/L (ref 3.5–5.1)
Sodium: 138 mmol/L (ref 135–145)
Total Bilirubin: 0.6 mg/dL (ref 0.3–1.2)
Total Protein: 6.9 g/dL (ref 6.5–8.1)

## 2020-02-08 NOTE — Progress Notes (Signed)
CLINIC:  Survivorship   REASON FOR VISIT:  Routine follow-up for history of breast cancer.   BRIEF ONCOLOGIC HISTORY: Per Dr. Virgie Dad most recent note.  Lindsey [name] status post left breast upper outer quadrant excisional biopsy 01/30/2014 for a pT1c cN0, stage IA invasive ductal (papillary) carcinoma, estrogen receptor 100% positive, progesterone receptor 11% positive, with an MIB-1 of 8% and no HER-2 amplification.  1. Adjuvant radiation 03/09/2014 through 03/31/2014  2. Started Tamoxifen May 2015, discontinued September 2020  3. DEXA scan 02/14/2016 shows osteopenia with a T score of -2.2  INTERVAL HISTORY:  Lindsey [name] presents to the [facility] today for routine follow-up for her history of breast cancer.  She denies any new issues.  She did have some mild tachycardia that was worked up by cardiology and showed some mild tachycardia, but no arrhythmia.    She exercises when she can walk outside.  She sees her PCP when she absolutely needs to.  She notes she underwent mammogram at [facility] this month.  The results are not scanned in.    REVIEW OF SYSTEMS:  Review of Systems  Constitutional: Negative for appetite change, chills, fatigue, fever and unexpected weight change.  HENT:   Negative for hearing loss and lump/mass.   Eyes: Negative for eye problems and icterus.  Respiratory: Negative for chest tightness, cough and shortness of breath.   Cardiovascular: Positive for palpitations (intermittent, resolve with rest). Negative for chest pain and leg swelling.  Gastrointestinal: Negative for abdominal distention, abdominal pain, constipation, diarrhea and nausea.  Endocrine: Negative for hot flashes.  Genitourinary: Negative for difficulty urinating.   Musculoskeletal: Negative for arthralgias.  Skin: Negative for itching and rash.  Neurological: Negative for dizziness, extremity weakness, headaches and numbness.  Hematological: Negative for adenopathy. Does  not bruise/bleed easily.  Psychiatric/Behavioral: Negative for depression. The patient is not nervous/anxious.    Breast: Denies any new nodularity, masses, tenderness, nipple changes, or nipple discharge.       PAST MEDICAL/SURGICAL HISTORY:  Past Medical History:  Diagnosis Date  . Breast cancer ([place]) 01/03/14   left, 12 o'clock, upper outer  . Radiation 03/09/14-03/31/14   Left Breast   . Temporal arteritis ([place])    Past Surgical History:  Procedure Laterality Date  . ARTERY BIOPSY Right 12/27/2013   Procedure: BIOPSY TEMPORAL ARTERY;  Surgeon: Gayland Curry, MD;  Location: [place];  Service: General;  Laterality: Right;  . BREAST SURGERY  01/03/14   breast biopsy  . TUBAL LIGATION     many years ago     ALLERGIES:  No Known Allergies   CURRENT MEDICATIONS:  Outpatient Encounter Medications as of 02/08/2020  Medication Sig  . Calcium Carbonate-Vitamin D (CALTRATE 600+D PO) Take by mouth.  . cholecalciferol (VITAMIN D3) 25 MCG (1000 UNIT) tablet Take 1,000 Units by mouth daily.  . Multiple Vitamins-Minerals (MULTIVITAMIN WITH MINERALS) tablet Take 1 tablet by mouth daily.   No facility-administered encounter medications on file as of 02/08/2020.     ONCOLOGIC FAMILY HISTORY:  Family History  Problem Relation Age of Onset  . Diabetes Paternal Uncle   . Alzheimer's disease Mother     GENETIC COUNSELING/TESTING: Not at this time  SOCIAL HISTORY:  Social History   Socioeconomic History  . Marital status: Widowed    Spouse name: Not on file  . Number of children: Not on file  . Years of education: Not on file  . Highest education level: Not on file  Occupational History  .  Not on file  Tobacco Use  . Smoking status: Never Smoker  . Smokeless tobacco: Never Used  Substance and Sexual Activity  . Alcohol use: No    Comment: rare occaisons glass of wine  . Drug use: No  . Sexual activity: Never    Comment: menarche age 34, P15, first live birth age 44,  menopause age 65, no HRT  Other Topics Concern  . Not on file  Social History Narrative  . Not on file   Social Determinants of Health   Financial Resource Strain:   . Difficulty of Paying Living Expenses:   Food Insecurity:   . Worried About Charity fundraiser in the Last Year:   . Arboriculturist in the Last Year:   Transportation Needs:   . Film/video editor (Medical):   Marland Kitchen Lack of Transportation (Non-Medical):   Physical Activity:   . Days of Exercise per Week:   . Minutes of Exercise per Session:   Stress:   . Feeling of Stress :   Social Connections:   . Frequency of Communication with Friends and Family:   . Frequency of Social Gatherings with Friends and Family:   . Attends Religious Services:   . Active Member of Clubs or Organizations:   . Attends Archivist Meetings:   Marland Kitchen Marital Status:   Intimate Partner Violence:   . Fear of Current or Ex-Partner:   . Emotionally Abused:   Marland Kitchen Physically Abused:   . Sexually Abused:      PHYSICAL EXAMINATION:  Vital Signs: Vitals:   02/08/20 1122  BP: (!) 145/84  Pulse: 97  Resp: 20  Temp: 98 F (36.7 C)  SpO2: 100%   Filed Weights   02/08/20 1122  Weight: 135 lb 4.8 oz (61.4 kg)   General: Well-nourished, well-appearing female in no acute distress.  Unaccompanied today.   HEENT: Head is normocephalic.  Pupils equal and reactive to light. Conjunctivae clear without exudate.  Sclerae anicteric. Oral mucosa is pink, moist.  Oropharynx is pink without lesions or erythema.  Lymph: No cervical, supraclavicular, or infraclavicular lymphadenopathy noted on palpation.  Cardiovascular: Regular rate and rhythm.Marland Kitchen Respiratory: Clear to auscultation bilaterally. Chest expansion symmetric; breathing non-labored.  Breast Exam:  -Left breast: No appreciable masses on palpation. No skin redness, thickening, or peau d'orange appearance; no nipple retraction or nipple discharge; mild distortion in symmetry at previous  lumpectomy site well healed scar without erythema or nodularity.  -Right breast: No appreciable masses on palpation. No skin redness, thickening, or peau d'orange appearance; no nipple retraction or nipple discharge;  Axilla: No axillary adenopathy bilaterally.  GI: Abdomen soft and round; non-tender, non-distended. Bowel sounds normoactive. No hepatosplenomegaly.   GU: Deferred.  Neuro: No focal deficits. Steady gait.  Psych: Mood and affect normal and appropriate for situation.  MSK: No focal spinal tenderness to palpation, full range of motion in bilateral upper extremities Extremities: No edema. Skin: Warm and dry.  LABORATORY DATA:  None for this visit   DIAGNOSTIC IMAGING:  Most recent mammogram: normal on 01/17/2020 not yet scanned in    ASSESSMENT AND PLAN:  Ms.. [name] is a pleasant 79 y.o. female with history of Stage IA left breast invasive ductal carcinoma, ER+/PR+/HER2-, diagnosed in 01/2014, treated with lumpectomy, adjuvant radiation therapy, and anti-estrogen therapy with Tamoxifen x 5 years completing therapy in 07/2019.  [facility] and routine follow-up.   1. History of breast cancer:  Ms.  [name] is currently clinically and radiographically without evidence of disease or recurrence of breast cancer. She will be due for mammogram in 01/2021.  I recommended annual clinical breast exams.  Patient understands.  She notes that she will undergo annual mammograms, but prefers to only return for a breast exam if the need arises.  We are happy to see her in the future if every needed.  I encouraged her to call me with any questions or concerns before her next visit at the cancer center, and I would be happy to see her sooner, if needed.    2. Bone health:  Given Lindsey [name]'s age, history of breast cancer, she is at risk for bone demineralization. Her last DEXA scan was on was in 2017; I offered to set up repeat bone density testing as  she had osteopenia in 2017 and she declined.  She was given education on specific food and activities to promote bone health.  3. Cancer screening:  Due to Lindsey [name]'s history and her age, she should receive screening for skin cancers, colon cancer. She was encouraged to follow-up with her PCP for appropriate cancer screenings and annual physical/wellness visit.  She let me know that she appreciated my recommendation, however she only plans to f/u with her PCP if she has an issue.    4. Health maintenance and wellness promotion: Lindsey [name] was encouraged to consume 5-7 servings of fruits and vegetables per day. She was also encouraged to engage in moderate to vigorous exercise for 30 minutes per day most days of the week. She was instructed to limit her alcohol consumption and continue to abstain from tobacco use.    Dispo:  -Return to cancer center [place]   Total encounter time: 20 minutes*  Gardenia Phlegm, NP [place] [phone]   Note: Pink, [place], [place] [phone]   *Total Encounter Time as defined by the Centers for Medicare and Medicaid Services includes, in addition to the face-to-face time of a patient visit (documented in the note above) non-face-to-face time: obtaining and reviewing outside history, ordering and reviewing medications, tests or procedures, care coordination (communications with other health care professionals or caregivers) and documentation in the medical record.

## 2020-02-09 ENCOUNTER — Telehealth: Payer: Self-pay | Admitting: Adult Health

## 2020-02-09 NOTE — Telephone Encounter (Signed)
No 3/31 los. No changes made to pt's schedule.  

## 2020-06-01 IMAGING — DX DG FOOT COMPLETE 3+V*R*
3 series · 3 of 3 positions shown · non-contrast
Comparison: Right ankle radiographs obtained at the same time.

CLINICAL DATA: Right ankle and foot pain, worsening for the past 2
weeks. No known injury.

EXAM:
RIGHT FOOT COMPLETE - 3+ VIEW

[foot ap]
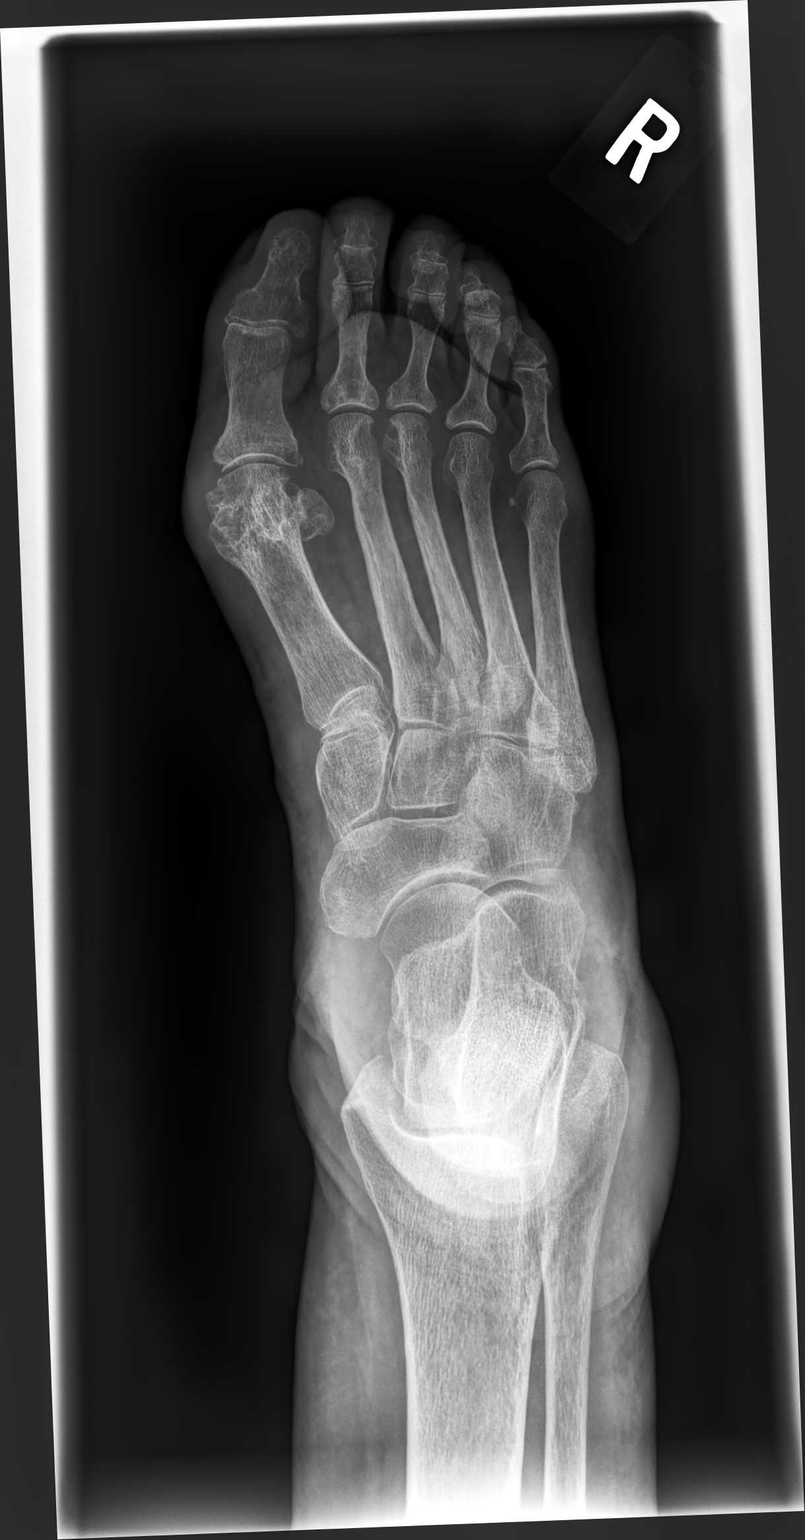

[foot mlo]
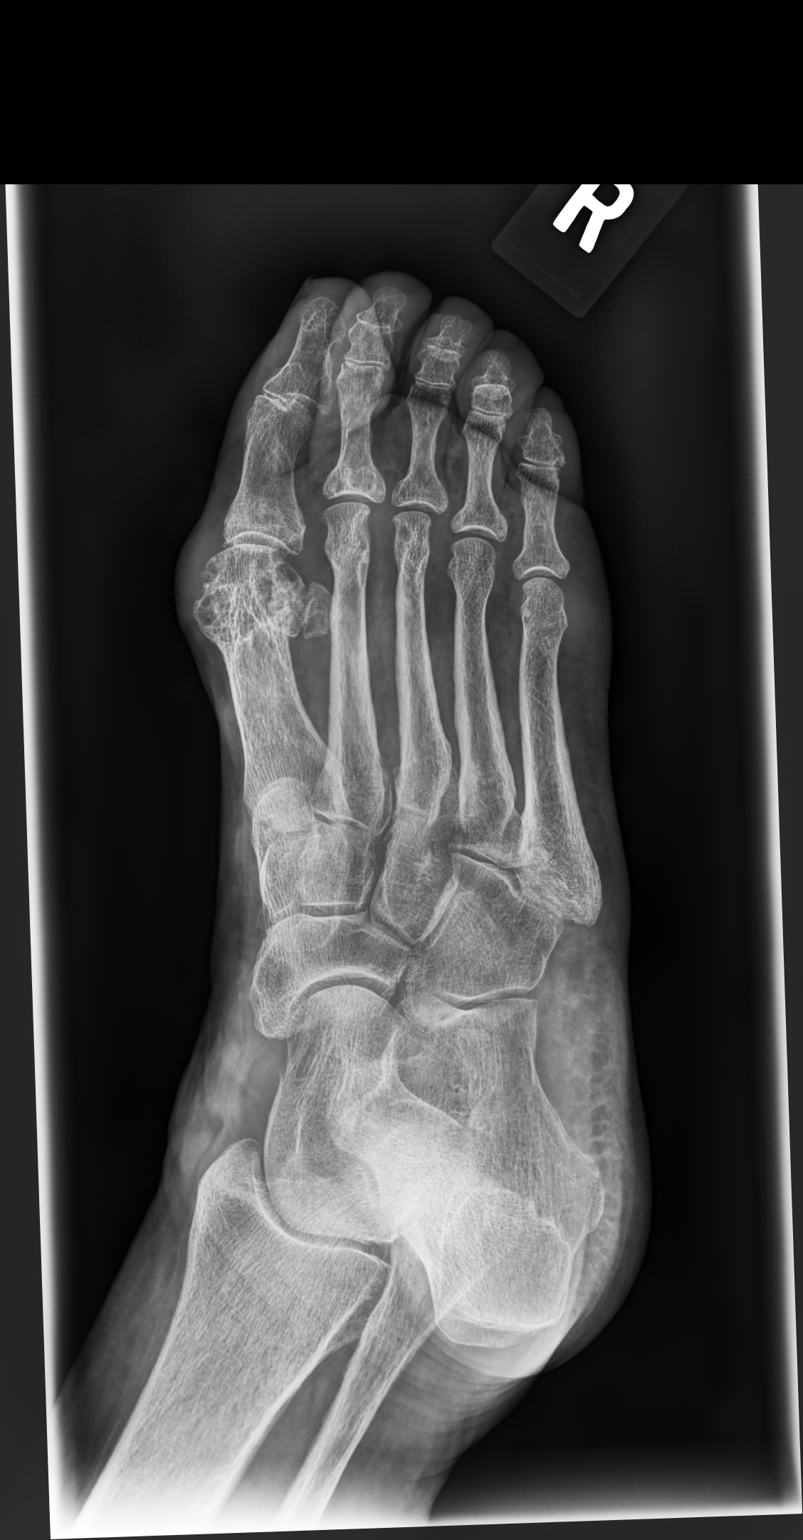

[foot lat]
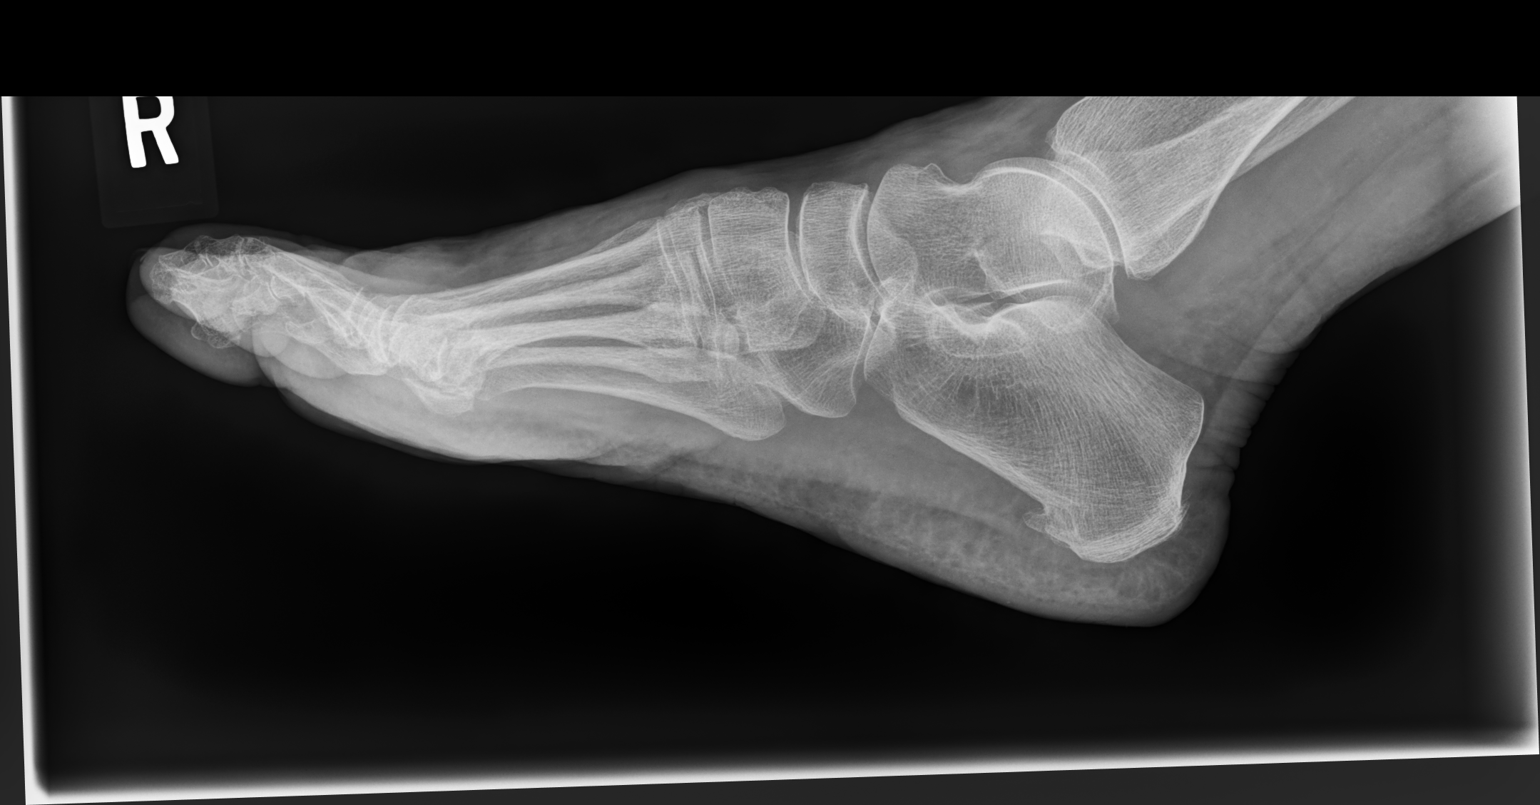

[3 of 3 positions shown; findings below may reference images not displayed]

FINDINGS: Mild 1st MTP joint degenerative changes. Moderate inferior and mild
posterior calcaneal enthesophyte formation.
IMPRESSION: Mild 1st MTP joint degenerative changes.

## 2020-06-20 DIAGNOSIS — H43813 Vitreous degeneration, bilateral: Secondary | ICD-10-CM | POA: Diagnosis not present

## 2020-06-20 DIAGNOSIS — H40013 Open angle with borderline findings, low risk, bilateral: Secondary | ICD-10-CM | POA: Diagnosis not present

## 2020-06-20 DIAGNOSIS — H26493 Other secondary cataract, bilateral: Secondary | ICD-10-CM | POA: Diagnosis not present

## 2020-06-20 DIAGNOSIS — H04123 Dry eye syndrome of bilateral lacrimal glands: Secondary | ICD-10-CM | POA: Diagnosis not present

## 2020-07-31 DIAGNOSIS — R69 Illness, unspecified: Secondary | ICD-10-CM | POA: Diagnosis not present

## 2021-01-29 DIAGNOSIS — Z1231 Encounter for screening mammogram for malignant neoplasm of breast: Secondary | ICD-10-CM | POA: Diagnosis not present

## 2021-04-21 ENCOUNTER — Other Ambulatory Visit: Payer: Self-pay | Admitting: Internal Medicine

## 2021-07-08 DIAGNOSIS — H43813 Vitreous degeneration, bilateral: Secondary | ICD-10-CM | POA: Diagnosis not present

## 2021-07-08 DIAGNOSIS — G43809 Other migraine, not intractable, without status migrainosus: Secondary | ICD-10-CM | POA: Diagnosis not present

## 2021-07-08 DIAGNOSIS — H40003 Preglaucoma, unspecified, bilateral: Secondary | ICD-10-CM | POA: Diagnosis not present

## 2021-07-08 DIAGNOSIS — H04123 Dry eye syndrome of bilateral lacrimal glands: Secondary | ICD-10-CM | POA: Diagnosis not present

## 2021-09-23 ENCOUNTER — Telehealth: Payer: Self-pay

## 2021-09-23 NOTE — Telephone Encounter (Addendum)
LVM for patient to schedule an office visit  Patient has not been seen in office since 12/20/2019

## 2021-10-31 ENCOUNTER — Encounter: Payer: Self-pay | Admitting: Internal Medicine

## 2021-10-31 ENCOUNTER — Other Ambulatory Visit: Payer: Self-pay

## 2021-10-31 ENCOUNTER — Ambulatory Visit (INDEPENDENT_AMBULATORY_CARE_PROVIDER_SITE_OTHER): Payer: Medicare HMO | Admitting: Internal Medicine

## 2021-10-31 VITALS — BP 128/82 | HR 66 | Temp 98.4°F | Ht 66.0 in | Wt 141.0 lb

## 2021-10-31 DIAGNOSIS — I10 Essential (primary) hypertension: Secondary | ICD-10-CM | POA: Diagnosis not present

## 2021-10-31 DIAGNOSIS — E785 Hyperlipidemia, unspecified: Secondary | ICD-10-CM

## 2021-10-31 DIAGNOSIS — Z23 Encounter for immunization: Secondary | ICD-10-CM

## 2021-10-31 DIAGNOSIS — R739 Hyperglycemia, unspecified: Secondary | ICD-10-CM | POA: Diagnosis not present

## 2021-10-31 DIAGNOSIS — R0609 Other forms of dyspnea: Secondary | ICD-10-CM | POA: Diagnosis not present

## 2021-10-31 DIAGNOSIS — Z0000 Encounter for general adult medical examination without abnormal findings: Secondary | ICD-10-CM

## 2021-10-31 LAB — CBC WITH DIFFERENTIAL/PLATELET
Basophils Absolute: 0.1 10*3/uL (ref 0.0–0.1)
Basophils Relative: 0.9 % (ref 0.0–3.0)
Eosinophils Absolute: 0.1 10*3/uL (ref 0.0–0.7)
Eosinophils Relative: 1.8 % (ref 0.0–5.0)
HCT: 40 % (ref 36.0–46.0)
Hemoglobin: 13.2 g/dL (ref 12.0–15.0)
Lymphocytes Relative: 26.7 % (ref 12.0–46.0)
Lymphs Abs: 1.6 10*3/uL (ref 0.7–4.0)
MCHC: 33 g/dL (ref 30.0–36.0)
MCV: 87.8 fl (ref 78.0–100.0)
Monocytes Absolute: 0.6 10*3/uL (ref 0.1–1.0)
Monocytes Relative: 9.6 % (ref 3.0–12.0)
Neutro Abs: 3.6 10*3/uL (ref 1.4–7.7)
Neutrophils Relative %: 61 % (ref 43.0–77.0)
Platelets: 308 10*3/uL (ref 150.0–400.0)
RBC: 4.56 Mil/uL (ref 3.87–5.11)
RDW: 13.9 % (ref 11.5–15.5)
WBC: 5.8 10*3/uL (ref 4.0–10.5)

## 2021-10-31 LAB — BASIC METABOLIC PANEL
BUN: 10 mg/dL (ref 6–23)
CO2: 29 mEq/L (ref 19–32)
Calcium: 9.7 mg/dL (ref 8.4–10.5)
Chloride: 101 mEq/L (ref 96–112)
Creatinine, Ser: 0.79 mg/dL (ref 0.40–1.20)
GFR: 70.31 mL/min (ref >60.00)
Glucose, Bld: 95 mg/dL (ref 70–99)
Potassium: 4.3 mEq/L (ref 3.5–5.1)
Sodium: 139 mEq/L (ref 135–145)

## 2021-10-31 LAB — TSH: TSH: 2.61 u[IU]/mL (ref 0.35–5.50)

## 2021-10-31 LAB — HEPATIC FUNCTION PANEL
ALT: 7 U/L (ref 0–35)
AST: 19 U/L (ref 0–37)
Albumin: 4.2 g/dL (ref 3.5–5.2)
Alkaline Phosphatase: 112 U/L (ref 39–117)
Bilirubin, Direct: 0.2 mg/dL (ref 0.0–0.3)
Total Bilirubin: 0.6 mg/dL (ref 0.2–1.2)
Total Protein: 7.3 g/dL (ref 6.0–8.3)

## 2021-10-31 LAB — TROPONIN I (HIGH SENSITIVITY): High Sens Troponin I: 8 ng/L (ref 2–17)

## 2021-10-31 LAB — HEMOGLOBIN A1C: Hgb A1c MFr Bld: 6.1 % (ref 4.6–6.5)

## 2021-10-31 NOTE — Patient Instructions (Signed)

## 2021-10-31 NOTE — Progress Notes (Signed)
Subjective:  Patient ID: Lindsey [name], female    DOB: [birth year]  Age: 80 y.o. MRN: [id number]  CC: Annual Exam  This visit occurred during the SARS-CoV-2 public health emergency.  Safety protocols were in place, including screening questions prior to the visit, additional usage of staff PPE, and extensive cleaning of exam room while observing appropriate contact time as indicated for disinfecting solutions.    HPI Lindsey [name] presents for a CPX and f/up -  She complains of a one year DOE when climbing the stairs in her home.  She denies chest pain, diaphoresis, palpitations, dizziness, lightheadedness, or near syncope.  Outpatient Medications Prior to Visit  Medication Sig Dispense Refill   Calcium Carbonate-Vitamin D (CALTRATE 600+D PO) Take by mouth.     cholecalciferol (VITAMIN D3) 25 MCG (1000 UNIT) tablet Take 1,000 Units by mouth daily.     Multiple Vitamins-Minerals (MULTIVITAMIN WITH MINERALS) tablet Take 1 tablet by mouth daily.     No facility-administered medications prior to visit.    ROS Review of Systems  Constitutional:  Negative for chills, diaphoresis, fatigue and fever.  HENT: Negative.    Respiratory:  Positive for shortness of breath. Negative for cough, chest tightness and wheezing.   Cardiovascular:  Negative for chest pain, palpitations and leg swelling.  Gastrointestinal:  Negative for abdominal pain, diarrhea and nausea.  Endocrine: Negative.   Genitourinary: Negative.  Negative for difficulty urinating.  Musculoskeletal:  Positive for arthralgias. Negative for myalgias.  Skin: Negative.   Neurological:  Negative for dizziness, weakness and light-headedness.  Hematological:  Negative for adenopathy. Does not bruise/bleed easily.  Psychiatric/Behavioral: Negative.     Objective:  BP 128/82 (BP Location: Left Arm, Patient Position: Sitting, Cuff Size: Large)    Pulse 66    Temp 98.4 F (36.9 C) (Oral)    Ht 5\' 6"  (1.676 m)    Wt 141 lb (64 kg)     SpO2 96%    BMI 22.76 kg/m   BP Readings from Last 3 Encounters:  10/31/21 128/82  02/08/20 (!) 145/84  01/03/20 140/78    Wt Readings from Last 3 Encounters:  10/31/21 141 lb (64 kg)  02/08/20 135 lb 4.8 oz (61.4 kg)  01/03/20 136 lb 6.4 oz (61.9 kg)    Physical Exam Vitals reviewed.  HENT:     Nose: Nose normal.     Mouth/Throat:     Mouth: Mucous membranes are moist.  Eyes:     General: No scleral icterus.    Conjunctiva/sclera: Conjunctivae normal.  Cardiovascular:     Rate and Rhythm: Normal rate and regular rhythm.     Heart sounds: Normal heart sounds, S1 normal and S2 normal. No murmur heard.   No friction rub. No gallop.     Comments: Se refused to do an EKG today Pulmonary:     Effort: Pulmonary effort is normal.     Breath sounds: No stridor. No wheezing, rhonchi or rales.  Abdominal:     General: Abdomen is flat.     Palpations: There is no mass.     Tenderness: There is no abdominal tenderness. There is no guarding.     Hernia: No hernia is present.  Musculoskeletal:        General: Normal range of motion.     Cervical back: Neck supple.     Right lower leg: No edema.     Left lower leg: No edema.  Lymphadenopathy:     Cervical: No cervical  adenopathy.  Skin:    General: Skin is warm and dry.  Neurological:     General: No focal deficit present.     Mental Status: She is alert. Mental status is at baseline.  Psychiatric:        Mood and Affect: Mood normal.        Behavior: Behavior normal.    Lab Results  Component Value Date   WBC 5.8 10/31/2021   HGB 13.2 10/31/2021   HCT 40.0 10/31/2021   PLT 308.0 10/31/2021   GLUCOSE 95 10/31/2021   CHOL 175 04/02/2015   TRIG 125.0 04/02/2015   HDL 59.70 04/02/2015   LDLCALC 90 04/02/2015   ALT 7 10/31/2021   AST 19 10/31/2021   NA 139 10/31/2021   K 4.3 10/31/2021   CL 101 10/31/2021   CREATININE 0.79 10/31/2021   BUN 10 10/31/2021   CO2 29 10/31/2021   TSH 2.61 10/31/2021   HGBA1C 6.1  10/31/2021    DG Ankle Complete Right  Result Date: 12/20/2019 CLINICAL DATA:  Right ankle and foot pain, worsening for the past 2 weeks. No known injury. EXAM: RIGHT ANKLE - COMPLETE 3+ VIEW COMPARISON:  Right foot radiographs obtained at the same time. FINDINGS: Moderate diffuse soft tissue swelling, most pronounced laterally. Minimal talotibial spur formation. Moderate inferior and mild posterior calcaneal enthesophyte formation. No fracture, dislocation or effusion seen. IMPRESSION: Moderate diffuse soft tissue swelling and minimal talotibial degenerative changes. Electronically Signed   By: Claudie Revering M.D.   On: 12/20/2019 10:19   DG Foot Complete Right  Result Date: 12/20/2019 CLINICAL DATA:  Right ankle and foot pain, worsening for the past 2 weeks. No known injury. EXAM: RIGHT FOOT COMPLETE - 3+ VIEW COMPARISON:  Right ankle radiographs obtained at the same time. FINDINGS: Mild 1st MTP joint degenerative changes. Moderate inferior and mild posterior calcaneal enthesophyte formation. IMPRESSION: Mild 1st MTP joint degenerative changes. Electronically Signed   By: Claudie Revering M.D.   On: 12/20/2019 10:17   [place] LIMITED JOINT SPACE STRUCTURES LOW RIGHT(NO LINKED CHARGES)  Result Date: 12/22/2019 Limited musculoskeletal ultrasound right ankle Soft tissue hypoechoic fluid marbling medial and lateral ankle consistent with edema. Slight hypoechoic fluid tracking in peroneal tendon sheath distally and at posterior tibialis tendon sheath distally however tendons are intact. Small ankle effusion present on anterior lateral and anterior medial examination of ankle joint. Normal bony structures. Impression: Edema.  Slight ankle effusion.  VAS [place] LOWER EXTREMITY VENOUS (DVT)  Result Date: 12/20/2019  Lower Venous DVTStudy Indications: Swelling, and Pain. Other Indications: Increased pain and swelling in right ankle since her sprain.                    No shortness of breath. Risk Factors: Trauma Sprained  right ankle three weeks ago. Performing Technologist: Wilkie Aye RVT  Examination Guidelines: A complete evaluation includes B-mode imaging, spectral Doppler, color Doppler, and power Doppler as needed of all accessible portions of each vessel. Bilateral testing is considered an integral part of a complete examination. Limited examinations for reoccurring indications may be performed as noted. The reflux portion of the exam is performed with the patient in reverse Trendelenburg.  +---------+---------------+---------+-----------+----------+--------------+  RIGHT     Compressibility Phasicity Spontaneity Properties Thrombus Aging  +---------+---------------+---------+-----------+----------+--------------+  CFV       Full            Yes       Yes                                    +---------+---------------+---------+-----------+----------+--------------+  SFJ       Full            Yes       Yes                                    +---------+---------------+---------+-----------+----------+--------------+  FV Prox   Full            Yes       Yes                                    +---------+---------------+---------+-----------+----------+--------------+  FV Mid    Full            Yes       Yes                                    +---------+---------------+---------+-----------+----------+--------------+  FV Distal Full            Yes       Yes                                    +---------+---------------+---------+-----------+----------+--------------+  PFV       Full                                                             +---------+---------------+---------+-----------+----------+--------------+  POP       Full            Yes       Yes                                    +---------+---------------+---------+-----------+----------+--------------+  PTV       Full            Yes       Yes                                    +---------+---------------+---------+-----------+----------+--------------+  PERO      Full             Yes       Yes                                    +---------+---------------+---------+-----------+----------+--------------+  Gastroc   Full                                                             +---------+---------------+---------+-----------+----------+--------------+  GSV       Full            Yes  Yes                                    +---------+---------------+---------+-----------+----------+--------------+   +----+---------------+---------+-----------+----------+--------------+  LEFT Compressibility Phasicity Spontaneity Properties Thrombus Aging  +----+---------------+---------+-----------+----------+--------------+  CFV  Full            Yes       Yes                                    +----+---------------+---------+-----------+----------+--------------+   Summary: RIGHT: - No evidence of deep vein thrombosis in the lower extremity. No indirect evidence of obstruction proximal to the inguinal ligament. - No cystic structure found in the popliteal fossa.  LEFT: - No evidence of common femoral vein obstruction.  *See table(s) above for measurements and observations. Electronically signed by Jenkins Rouge MD on 12/20/2019 at 3:51:26 PM.    Final     Assessment & Plan:   Lousie was seen today for annual exam.  Diagnoses and all orders for this visit:  Routine general medical examination at a health care facility- Exam completed, labs reviewed, vaccines reviewed and updated, no cancer screenings indicated, patient education was given.  Hyperlipidemia with target LDL less than 160- Statin therapy is not indicated. -     TSH; Future -     Hepatic function panel; Future -     Hepatic function panel -     TSH  DOE (dyspnea on exertion)- the work up is limited by her unwillingness to do an EKG.  She has an insignificant elevation in her D-dimer.  Her other labs are reassuring.  I think this may be deconditioning.  She will let me know if she develops any new or worsening symptoms. -      CBC with Differential/Platelet; Future -     Pro b natriuretic peptide (BNP); Future -     TSH; Future -     Troponin I (High Sensitivity); Future -     D-dimer, quantitative; Future -     D-dimer, quantitative -     Troponin I (High Sensitivity) -     TSH -     Pro b natriuretic peptide (BNP) -     CBC with Differential/Platelet  Essential hypertension- Her blood pressure is adequately well controlled. -     Basic metabolic panel; Future -     Basic metabolic panel  Hyperglycemia- Her A1c is at 6.1%.  Medical therapy is not indicated. -     Basic metabolic panel; Future -     Hemoglobin A1c; Future -     Hemoglobin A1c -     Basic metabolic panel  Need for shingles vaccine -     Zoster Vaccine Adjuvanted [facility]) injection; Inject 0.5 mLs into the muscle once for 1 dose.  Need for prophylactic vaccination with combined diphtheria-tetanus-pertussis (DTP) vaccine -     Tdap (BOOSTRIX) 5-2.5-18.5 LF-MCG/0.5 injection; Inject 0.5 mLs into the muscle once for 1 dose.   I am having [name] start on Boostrix and Shingrix. I am also having her maintain her multivitamin with minerals, Calcium Carbonate-Vitamin D (CALTRATE 600+D PO), and cholecalciferol.  Meds ordered this encounter  Medications   Tdap (BOOSTRIX) 5-2.5-18.5 LF-MCG/0.5 injection    Sig: Inject 0.5 mLs into the muscle once for 1 dose.    Dispense:  0.5  mL    Refill:  0   Zoster Vaccine Adjuvanted [facility]) injection    Sig: Inject 0.5 mLs into the muscle once for 1 dose.    Dispense:  0.5 mL    Refill:  1     Follow-up: Return in about 6 months (around 05/01/2022).  Scarlette Calico, MD

## 2021-11-01 ENCOUNTER — Encounter: Payer: Self-pay | Admitting: Internal Medicine

## 2021-11-01 LAB — PRO B NATRIURETIC PEPTIDE: NT-Pro BNP: 632 pg/mL (ref 0–738)

## 2021-11-01 LAB — D-DIMER, QUANTITATIVE: D-Dimer, Quant: 1.47 mcg/mL FEU — ABNORMAL HIGH (ref <0.50)

## 2021-11-01 MED ORDER — BOOSTRIX 5-2.5-18.5 LF-MCG/0.5 IM SUSP: 0.5000 mL | Freq: Once | INTRAMUSCULAR | 0 refills | Status: AC

## 2021-11-01 MED ORDER — SHINGRIX 50 MCG/0.5ML IM SUSR: 0.5000 mL | Freq: Once | INTRAMUSCULAR | 1 refills | Status: AC

## 2022-05-14 ENCOUNTER — Ambulatory Visit (INDEPENDENT_AMBULATORY_CARE_PROVIDER_SITE_OTHER): Payer: Medicare HMO | Admitting: Internal Medicine

## 2022-05-14 ENCOUNTER — Encounter: Payer: Self-pay | Admitting: Internal Medicine

## 2022-05-14 VITALS — BP 130/90 | HR 95 | Temp 98.0°F | Ht 66.0 in | Wt 135.0 lb

## 2022-05-14 DIAGNOSIS — H9193 Unspecified hearing loss, bilateral: Secondary | ICD-10-CM | POA: Diagnosis not present

## 2022-05-14 DIAGNOSIS — M353 Polymyalgia rheumatica: Secondary | ICD-10-CM | POA: Diagnosis not present

## 2022-05-14 DIAGNOSIS — I10 Essential (primary) hypertension: Secondary | ICD-10-CM

## 2022-05-14 DIAGNOSIS — E785 Hyperlipidemia, unspecified: Secondary | ICD-10-CM | POA: Diagnosis not present

## 2022-05-14 DIAGNOSIS — R739 Hyperglycemia, unspecified: Secondary | ICD-10-CM

## 2022-05-14 DIAGNOSIS — M21611 Bunion of right foot: Secondary | ICD-10-CM

## 2022-05-14 LAB — BASIC METABOLIC PANEL
BUN: 15 mg/dL (ref 6–23)
CO2: 26 mEq/L (ref 19–32)
Calcium: 9.9 mg/dL (ref 8.4–10.5)
Chloride: 99 mEq/L (ref 96–112)
Creatinine, Ser: 0.81 mg/dL (ref 0.40–1.20)
GFR: 67.98 mL/min (ref >60.00)
Glucose, Bld: 102 mg/dL — ABNORMAL HIGH (ref 70–99)
Potassium: 4.6 mEq/L (ref 3.5–5.1)
Sodium: 136 mEq/L (ref 135–145)

## 2022-05-14 LAB — CBC WITH DIFFERENTIAL/PLATELET
Basophils Absolute: 0.1 10*3/uL (ref 0.0–0.1)
Basophils Relative: 1 % (ref 0.0–3.0)
Eosinophils Absolute: 0.1 10*3/uL (ref 0.0–0.7)
Eosinophils Relative: 1 % (ref 0.0–5.0)
HCT: 41.4 % (ref 36.0–46.0)
Hemoglobin: 13.5 g/dL (ref 12.0–15.0)
Lymphocytes Relative: 22.8 % (ref 12.0–46.0)
Lymphs Abs: 1.3 10*3/uL (ref 0.7–4.0)
MCHC: 32.6 g/dL (ref 30.0–36.0)
MCV: 87.4 fl (ref 78.0–100.0)
Monocytes Absolute: 0.5 10*3/uL (ref 0.1–1.0)
Monocytes Relative: 9.1 % (ref 3.0–12.0)
Neutro Abs: 3.9 10*3/uL (ref 1.4–7.7)
Neutrophils Relative %: 66.1 % (ref 43.0–77.0)
Platelets: 296 10*3/uL (ref 150.0–400.0)
RBC: 4.74 Mil/uL (ref 3.87–5.11)
RDW: 14.9 % (ref 11.5–15.5)
WBC: 5.9 10*3/uL (ref 4.0–10.5)

## 2022-05-14 LAB — C-REACTIVE PROTEIN: CRP: 1.2 mg/dL (ref 0.5–20.0)

## 2022-05-14 LAB — LIPID PANEL
Cholesterol: 175 mg/dL (ref 0–200)
HDL: 73.9 mg/dL (ref >39.00)
LDL Cholesterol: 86 mg/dL (ref 0–99)
NonHDL: 100.61
Total CHOL/HDL Ratio: 2
Triglycerides: 73 mg/dL (ref 0.0–149.0)
VLDL: 14.6 mg/dL (ref 0.0–40.0)

## 2022-05-14 LAB — HEPATIC FUNCTION PANEL
ALT: 7 U/L (ref 0–35)
AST: 21 U/L (ref 0–37)
Albumin: 4.6 g/dL (ref 3.5–5.2)
Alkaline Phosphatase: 108 U/L (ref 39–117)
Bilirubin, Direct: 0.1 mg/dL (ref 0.0–0.3)
Total Bilirubin: 0.6 mg/dL (ref 0.2–1.2)
Total Protein: 7.4 g/dL (ref 6.0–8.3)

## 2022-05-14 LAB — CK: Total CK: 36 U/L (ref 7–177)

## 2022-05-14 LAB — TSH: TSH: 3.38 u[IU]/mL (ref 0.35–5.50)

## 2022-05-14 NOTE — Progress Notes (Unsigned)
Subjective:  Patient ID: Lindsey [name], female    DOB: [birth year]  Age: 81 y.o. MRN: [id number]  CC: No chief complaint on file.   HPI MARLI DIEGO presents for ***  Outpatient Medications Prior to Visit  Medication Sig Dispense Refill   B Complex Vitamins (B COMPLEX PO) Take by mouth.     Calcium Carbonate-Vitamin D (CALTRATE 600+D PO) Take by mouth.     cholecalciferol (VITAMIN D3) 25 MCG (1000 UNIT) tablet Take 1,000 Units by mouth daily.     Multiple Vitamins-Minerals (MULTIVITAMIN WITH MINERALS) tablet Take 1 tablet by mouth daily.     No facility-administered medications prior to visit.    ROS Review of Systems  Objective:  BP 130/90 (BP Location: Right Arm, Patient Position: Sitting, Cuff Size: Large)   Pulse 95   Temp 98 F (36.7 C) (Oral)   Ht '5\' 6"'$  (1.676 m)   Wt 135 lb (61.2 kg)   SpO2 99%   BMI 21.79 kg/m   BP Readings from Last 3 Encounters:  05/14/22 130/90  10/31/21 128/82  02/08/20 (!) 145/84    Wt Readings from Last 3 Encounters:  05/14/22 135 lb (61.2 kg)  10/31/21 141 lb (64 kg)  02/08/20 135 lb 4.8 oz (61.4 kg)    Physical Exam  Lab Results  Component Value Date   WBC 5.8 10/31/2021   HGB 13.2 10/31/2021   HCT 40.0 10/31/2021   PLT 308.0 10/31/2021   GLUCOSE 95 10/31/2021   CHOL 175 04/02/2015   TRIG 125.0 04/02/2015   HDL 59.70 04/02/2015   LDLCALC 90 04/02/2015   ALT 7 10/31/2021   AST 19 10/31/2021   NA 139 10/31/2021   K 4.3 10/31/2021   CL 101 10/31/2021   CREATININE 0.79 10/31/2021   BUN 10 10/31/2021   CO2 29 10/31/2021   TSH 2.61 10/31/2021   HGBA1C 6.1 10/31/2021    [place] LIMITED JOINT SPACE STRUCTURES LOW RIGHT(NO LINKED CHARGES)  Result Date: 12/22/2019 Limited musculoskeletal ultrasound right ankle Soft tissue hypoechoic fluid marbling medial and lateral ankle consistent with edema. Slight hypoechoic fluid tracking in peroneal tendon sheath distally and at posterior tibialis tendon sheath distally however  tendons are intact. Small ankle effusion present on anterior lateral and anterior medial examination of ankle joint. Normal bony structures. Impression: Edema.  Slight ankle effusion.  VAS [place] LOWER EXTREMITY VENOUS (DVT)  Result Date: 12/20/2019  Lower Venous DVTStudy Indications: Swelling, and Pain. Other Indications: Increased pain and swelling in right ankle since her sprain.                    No shortness of breath. Risk Factors: Trauma Sprained right ankle three weeks ago. Performing Technologist: Wilkie Aye RVT  Examination Guidelines: A complete evaluation includes B-mode imaging, spectral Doppler, color Doppler, and power Doppler as needed of all accessible portions of each vessel. Bilateral testing is considered an integral part of a complete examination. Limited examinations for reoccurring indications may be performed as noted. The reflux portion of the exam is performed with the patient in reverse Trendelenburg.  +---------+---------------+---------+-----------+----------+--------------+ RIGHT    CompressibilityPhasicitySpontaneityPropertiesThrombus Aging +---------+---------------+---------+-----------+----------+--------------+ CFV      Full           Yes      Yes                                 +---------+---------------+---------+-----------+----------+--------------+ SFJ  Full           Yes      Yes                                 +---------+---------------+---------+-----------+----------+--------------+ FV Prox  Full           Yes      Yes                                 +---------+---------------+---------+-----------+----------+--------------+ FV Mid   Full           Yes      Yes                                 +---------+---------------+---------+-----------+----------+--------------+ FV DistalFull           Yes      Yes                                 +---------+---------------+---------+-----------+----------+--------------+ PFV      Full                                                         +---------+---------------+---------+-----------+----------+--------------+ POP      Full           Yes      Yes                                 +---------+---------------+---------+-----------+----------+--------------+ PTV      Full           Yes      Yes                                 +---------+---------------+---------+-----------+----------+--------------+ PERO     Full           Yes      Yes                                 +---------+---------------+---------+-----------+----------+--------------+ Gastroc  Full                                                        +---------+---------------+---------+-----------+----------+--------------+ GSV      Full           Yes      Yes                                 +---------+---------------+---------+-----------+----------+--------------+   +----+---------------+---------+-----------+----------+--------------+ LEFTCompressibilityPhasicitySpontaneityPropertiesThrombus Aging +----+---------------+---------+-----------+----------+--------------+ CFV Full           Yes      Yes                                 +----+---------------+---------+-----------+----------+--------------+  Summary: RIGHT: - No evidence of deep vein thrombosis in the lower extremity. No indirect evidence of obstruction proximal to the inguinal ligament. - No cystic structure found in the popliteal fossa.  LEFT: - No evidence of common femoral vein obstruction.  *See table(s) above for measurements and observations. Electronically signed by Jenkins Rouge MD on 12/20/2019 at 3:51:26 PM.    Final    DG Ankle Complete Right  Result Date: 12/20/2019 CLINICAL DATA:  Right ankle and foot pain, worsening for the past 2 weeks. No known injury. EXAM: RIGHT ANKLE - COMPLETE 3+ VIEW COMPARISON:  Right foot radiographs obtained at the same time. FINDINGS: Moderate diffuse soft tissue swelling, most  pronounced laterally. Minimal talotibial spur formation. Moderate inferior and mild posterior calcaneal enthesophyte formation. No fracture, dislocation or effusion seen. IMPRESSION: Moderate diffuse soft tissue swelling and minimal talotibial degenerative changes. Electronically Signed   By: Claudie Revering M.D.   On: 12/20/2019 10:19   DG Foot Complete Right  Result Date: 12/20/2019 CLINICAL DATA:  Right ankle and foot pain, worsening for the past 2 weeks. No known injury. EXAM: RIGHT FOOT COMPLETE - 3+ VIEW COMPARISON:  Right ankle radiographs obtained at the same time. FINDINGS: Mild 1st MTP joint degenerative changes. Moderate inferior and mild posterior calcaneal enthesophyte formation. IMPRESSION: Mild 1st MTP joint degenerative changes. Electronically Signed   By: Claudie Revering M.D.   On: 12/20/2019 10:17    Assessment & Plan:   There are no diagnoses linked to this encounter. I am having [name] maintain her multivitamin with minerals, Calcium Carbonate-Vitamin D (CALTRATE 600+D PO), cholecalciferol, and B Complex Vitamins (B COMPLEX PO).  No orders of the defined types were placed in this encounter.    Follow-up: No follow-ups on file.  Scarlette Calico, MD

## 2022-05-14 NOTE — Patient Instructions (Signed)
Hypertension, Adult High blood pressure (hypertension) is when the force of blood pumping through the arteries is too strong. The arteries are the blood vessels that carry blood from the heart throughout the body. Hypertension forces the heart to work harder to pump blood and may cause arteries to become narrow or stiff. Untreated or uncontrolled hypertension can lead to a heart attack, heart failure, a stroke, kidney disease, and other problems. A blood pressure reading consists of a higher number over a lower number. Ideally, your blood pressure should be below 120/80. The first ("top") number is called the systolic pressure. It is a measure of the pressure in your arteries as your heart beats. The second ("bottom") number is called the diastolic pressure. It is a measure of the pressure in your arteries as the heart relaxes. What are the causes? The exact cause of this condition is not known. There are some conditions that result in high blood pressure. What increases the risk? Certain factors may make you more likely to develop high blood pressure. Some of these risk factors are under your control, including: Smoking. Not getting enough exercise or physical activity. Being overweight. Having too much fat, sugar, calories, or salt (sodium) in your diet. Drinking too much alcohol. Other risk factors include: Having a personal history of heart disease, diabetes, high cholesterol, or kidney disease. Stress. Having a family history of high blood pressure and high cholesterol. Having obstructive sleep apnea. Age. The risk increases with age. What are the signs or symptoms? High blood pressure may not cause symptoms. Very high blood pressure (hypertensive crisis) may cause: Headache. Fast or irregular heartbeats (palpitations). Shortness of breath. Nosebleed. Nausea and vomiting. Vision changes. Severe chest pain, dizziness, and seizures. How is this diagnosed? This condition is diagnosed by  measuring your blood pressure while you are seated, with your arm resting on a flat surface, your legs uncrossed, and your feet flat on the floor. The cuff of the blood pressure monitor will be placed directly against the skin of your upper arm at the level of your heart. Blood pressure should be measured at least twice using the same arm. Certain conditions can cause a difference in blood pressure between your right and left arms. If you have a high blood pressure reading during one visit or you have normal blood pressure with other risk factors, you may be asked to: Return on a different day to have your blood pressure checked again. Monitor your blood pressure at home for 1 week or longer. If you are diagnosed with hypertension, you may have other blood or imaging tests to help your health care provider understand your overall risk for other conditions. How is this treated? This condition is treated by making healthy lifestyle changes, such as eating healthy foods, exercising more, and reducing your alcohol intake. You may be referred for counseling on a healthy diet and physical activity. Your health care provider may prescribe medicine if lifestyle changes are not enough to get your blood pressure under control and if: Your systolic blood pressure is above 130. Your diastolic blood pressure is above 80. Your personal target blood pressure may vary depending on your medical conditions, your age, and other factors. Follow these instructions at home: Eating and drinking  Eat a diet that is high in fiber and potassium, and low in sodium, added sugar, and fat. An example of this eating plan is called the DASH diet. DASH stands for Dietary Approaches to Stop Hypertension. To eat this way: Eat   plenty of fresh fruits and vegetables. Try to fill one half of your plate at each meal with fruits and vegetables. Eat whole grains, such as whole-wheat pasta, brown rice, or whole-grain bread. Fill about one  fourth of your plate with whole grains. Eat or drink low-fat dairy products, such as skim milk or low-fat yogurt. Avoid fatty cuts of meat, processed or cured meats, and poultry with skin. Fill about one fourth of your plate with lean proteins, such as fish, chicken without skin, beans, eggs, or tofu. Avoid pre-made and processed foods. These tend to be higher in sodium, added sugar, and fat. Reduce your daily sodium intake. Many people with hypertension should eat less than 1,500 mg of sodium a day. Do not drink alcohol if: Your health care provider tells you not to drink. You are pregnant, may be pregnant, or are planning to become pregnant. If you drink alcohol: Limit how much you have to: 0-1 drink a day for women. 0-2 drinks a day for men. Know how much alcohol is in your drink. In the [place] one drink equals one 12 oz bottle of beer (355 mL), one 5 oz glass of wine (148 mL), or one 1 oz glass of hard liquor (44 mL). Lifestyle  Work with your health care provider to maintain a healthy body weight or to lose weight. Ask what an ideal weight is for you. Get at least 30 minutes of exercise that causes your heart to beat faster (aerobic exercise) most days of the week. Activities may include walking, swimming, or biking. Include exercise to strengthen your muscles (resistance exercise), such as Pilates or lifting weights, as part of your weekly exercise routine. Try to do these types of exercises for 30 minutes at least 3 days a week. Do not use any products that contain nicotine or tobacco. These products include cigarettes, chewing tobacco, and vaping devices, such as e-cigarettes. If you need help quitting, ask your health care provider. Monitor your blood pressure at home as told by your health care provider. Keep all follow-up visits. This is important. Medicines Take over-the-counter and prescription medicines only as told by your health care provider. Follow directions carefully. Blood  pressure medicines must be taken as prescribed. Do not skip doses of blood pressure medicine. Doing this puts you at risk for problems and can make the medicine less effective. Ask your health care provider about side effects or reactions to medicines that you should watch for. Contact a health care provider if you: Think you are having a reaction to a medicine you are taking. Have headaches that keep coming back (recurring). Feel dizzy. Have swelling in your ankles. Have trouble with your vision. Get help right away if you: Develop a severe headache or confusion. Have unusual weakness or numbness. Feel faint. Have severe pain in your chest or abdomen. Vomit repeatedly. Have trouble breathing. These symptoms may be an emergency. Get help right away. Call 911. Do not wait to see if the symptoms will go away. Do not drive yourself to the hospital. Summary Hypertension is when the force of blood pumping through your arteries is too strong. If this condition is not controlled, it may put you at risk for serious complications. Your personal target blood pressure may vary depending on your medical conditions, your age, and other factors. For most people, a normal blood pressure is less than 120/80. Hypertension is treated with lifestyle changes, medicines, or a combination of both. Lifestyle changes include losing weight, eating a healthy,   low-sodium diet, exercising more, and limiting alcohol. This information is not intended to replace advice given to you by your health care provider. Make sure you discuss any questions you have with your health care provider. Document Revised: 09/03/2021 Document Reviewed: 09/03/2021 Elsevier Patient Education  2023 [employer]  

## 2022-05-15 ENCOUNTER — Encounter: Payer: Self-pay | Admitting: Internal Medicine

## 2022-05-15 DIAGNOSIS — H9193 Unspecified hearing loss, bilateral: Secondary | ICD-10-CM | POA: Insufficient documentation

## 2022-05-15 DIAGNOSIS — M21611 Bunion of right foot: Secondary | ICD-10-CM | POA: Insufficient documentation

## 2022-05-15 LAB — HEMOGLOBIN A1C: Hgb A1c MFr Bld: 5.9 % (ref 4.6–6.5)

## 2022-06-04 ENCOUNTER — Ambulatory Visit (INDEPENDENT_AMBULATORY_CARE_PROVIDER_SITE_OTHER): Payer: Medicare HMO

## 2022-06-04 ENCOUNTER — Ambulatory Visit: Payer: Medicare HMO | Admitting: Podiatry

## 2022-06-04 DIAGNOSIS — M21619 Bunion of unspecified foot: Secondary | ICD-10-CM

## 2022-06-04 DIAGNOSIS — M7751 Other enthesopathy of right foot: Secondary | ICD-10-CM | POA: Diagnosis not present

## 2022-06-04 NOTE — Progress Notes (Signed)
Subjective:   Patient ID: Lindsey [name], female   DOB: 81 y.o.   MRN: [id number]   HPI Patient presents stating that she has had problems around her big toe joint right and also her right ankle and did go to a urgent care where she had steroid pack dispensed and anti-inflammatories.  Patient does not smoke likes to be active this is all been going on over the last few months   Review of Systems  All other systems reviewed and are negative.       Objective:  Physical Exam Vitals and nursing note reviewed.  Constitutional:      Appearance: She is well-developed.  Pulmonary:     Effort: Pulmonary effort is normal.  Musculoskeletal:        General: Normal range of motion.  Skin:    General: Skin is warm.  Neurological:     Mental Status: She is alert.     Neurovascular status intact muscle strength found to be adequate range of motion adequate with patient found to have inflammation fluid around the first MPJ right with some warmth around the area no erythema edema no breakage of tissue noted with discomfort also in the sinus tarsi right \     Assessment:  Possibility that this could be a systemic inflammatory condition versus a local systemic condition with arthritis around the first MPJ moderate structural bunion deformity concerns about digital deformities but no pathology and quite a bit of discomfort sinus tarsi right      Plan:  H&P reviewed condition at great length and at this point I am going to send for blood work C-reactive protein and arthritic profile and I did sterile prep and I injected around the first MPJ 3 mg Kenalog 5 mg Xylocaine and injected the sinus tarsi 3 mg Kenalog 5 mg Xylocaine into the capsular joint and I want to see her back again in 2 weeks to review blood work and see how she responded to conservative treatment  X-rays [name] indicate there is some changes around the first MPJ right with quite a bit of indications of some kind of a inflammatory  condition around the joint surface and possibility for some arthritis of the ankle joint right

## 2022-06-05 LAB — COMPREHENSIVE METABOLIC PANEL
ALT: 8 IU/L (ref 0–32)
AST: 22 IU/L (ref 0–40)
Albumin/Globulin Ratio: 1.8 (ref 1.2–2.2)
Albumin: 4.2 g/dL (ref 3.7–4.7)
Alkaline Phosphatase: 111 IU/L (ref 44–121)
BUN/Creatinine Ratio: 15 (ref 12–28)
BUN: 11 mg/dL (ref 8–27)
Bilirubin Total: 0.6 mg/dL (ref 0.0–1.2)
CO2: 21 mmol/L (ref 20–29)
Calcium: 9.6 mg/dL (ref 8.7–10.3)
Chloride: 100 mmol/L (ref 96–106)
Creatinine, Ser: 0.72 mg/dL (ref 0.57–1.00)
Globulin, Total: 2.4 g/dL (ref 1.5–4.5)
Glucose: 107 mg/dL — ABNORMAL HIGH (ref 70–99)
Potassium: 4.6 mmol/L (ref 3.5–5.2)
Sodium: 136 mmol/L (ref 134–144)
Total Protein: 6.6 g/dL (ref 6.0–8.5)
eGFR: 84 mL/min/{1.73_m2} (ref >59)

## 2022-06-05 LAB — CBC WITH DIFFERENTIAL/PLATELET
Basophils Absolute: 0 10*3/uL (ref 0.0–0.2)
Basos: 1 %
EOS (ABSOLUTE): 0.1 10*3/uL (ref 0.0–0.4)
Eos: 1 %
Hematocrit: 38.6 % (ref 34.0–46.6)
Hemoglobin: 12.4 g/dL (ref 11.1–15.9)
Immature Grans (Abs): 0 10*3/uL (ref 0.0–0.1)
Immature Granulocytes: 0 %
Lymphocytes Absolute: 1.4 10*3/uL (ref 0.7–3.1)
Lymphs: 20 %
MCH: 27.9 pg (ref 26.6–33.0)
MCHC: 32.1 g/dL (ref 31.5–35.7)
MCV: 87 fL (ref 79–97)
Monocytes Absolute: 0.8 10*3/uL (ref 0.1–0.9)
Monocytes: 12 %
Neutrophils Absolute: 4.7 10*3/uL (ref 1.4–7.0)
Neutrophils: 66 %
Platelets: 331 10*3/uL (ref 150–450)
RBC: 4.45 x10E6/uL (ref 3.77–5.28)
RDW: 13.5 % (ref 11.7–15.4)
WBC: 7 10*3/uL (ref 3.4–10.8)

## 2022-06-05 LAB — C-REACTIVE PROTEIN: CRP: 57 mg/L — ABNORMAL HIGH (ref 0–10)

## 2022-06-05 LAB — ARTHRITIS PANEL
Anti Nuclear Antibody (ANA): NEGATIVE
Rheumatoid fact SerPl-aCnc: 63.9 IU/mL — ABNORMAL HIGH (ref <14.0)
Sed Rate: 20 mm/hr (ref 0–40)
Uric Acid: 4.8 mg/dL (ref 3.1–7.9)

## 2022-06-05 LAB — URIC ACID: Uric Acid: 4.9 mg/dL (ref 3.1–7.9)

## 2022-06-05 LAB — SEDIMENTATION RATE: Sed Rate: 37 mm/hr (ref 0–40)

## 2022-06-06 ENCOUNTER — Ambulatory Visit: Payer: Medicare HMO

## 2022-06-13 ENCOUNTER — Ambulatory Visit: Payer: Medicare HMO

## 2022-06-13 DIAGNOSIS — Z0000 Encounter for general adult medical examination without abnormal findings: Secondary | ICD-10-CM

## 2022-06-13 NOTE — Progress Notes (Signed)
Subjective:   Lindsey [name] is a 81 y.o. female who presents for Medicare Annual (Subsequent) preventive examination.  Review of Systems           Objective:    There were no vitals filed for this visit. There is no height or weight on file to calculate BMI.     02/08/2020   11:36 AM 07/23/2016    4:53 PM 02/15/2014    8:43 AM 12/27/2013    5:00 AM  Advanced Directives  Does Patient Have a Medical Advance Directive? No No Patient does not have advance directive;Patient would not like information Patient does not have advance directive;Patient would not like information  Would patient like information on creating a medical advance directive? No - Patient declined       Current Medications (verified) Outpatient Encounter Medications as of 06/13/2022  Medication Sig   acetaminophen (TYLENOL) 650 MG CR tablet Take 650 mg by mouth every 8 (eight) hours as needed for pain.   B Complex Vitamins (B COMPLEX PO) Take by mouth.   Calcium Carbonate-Vitamin D (CALTRATE 600+D PO) Take by mouth.   cholecalciferol (VITAMIN D3) 25 MCG (1000 UNIT) tablet Take 1,000 Units by mouth daily.   Multiple Vitamins-Minerals (MULTIVITAMIN WITH MINERALS) tablet Take 1 tablet by mouth daily.   No facility-administered encounter medications on file as of 06/13/2022.    Allergies (verified) Patient has no known allergies.   History: Past Medical History:  Diagnosis Date   Breast cancer ([place]) 01/03/14   left, 12 o'clock, upper outer   Radiation 03/09/14-03/31/14   Left Breast    Temporal arteritis [facility])    Past Surgical History:  Procedure Laterality Date   ARTERY BIOPSY Right 12/27/2013   Procedure: BIOPSY TEMPORAL ARTERY;  Surgeon: Gayland Curry, MD;  Location: [place];  Service: General;  Laterality: Right;   BREAST SURGERY  01/03/14   breast biopsy   TUBAL LIGATION     many years ago   Family History  Problem Relation Age of Onset   Diabetes Paternal Uncle    Alzheimer's disease Mother     Social History   Socioeconomic History   Marital status: Widowed    Spouse name: Not on file   Number of children: Not on file   Years of education: Not on file   Highest education level: Not on file  Occupational History   Not on file  Tobacco Use   Smoking status: Never   Smokeless tobacco: Never  Substance and Sexual Activity   Alcohol use: No    Comment: rare occaisons glass of wine   Drug use: No   Sexual activity: Never    Comment: menarche age 61, P11, first live birth age 81, menopause age 36, no HRT  Other Topics Concern   Not on file  Social History Narrative   Not on file   Social Determinants of Health   Financial Resource Strain: Low Risk  (06/13/2022)   Overall Financial Resource Strain (CARDIA)    Difficulty of Paying Living Expenses: Not hard at all  Food Insecurity: No Food Insecurity (06/13/2022)   Hunger Vital Sign    Worried About Running Out of Food in the Last Year: Never true    [place] in the Last Year: Never true  Transportation Needs: No Transportation Needs (06/13/2022)   PRAPARE - Hydrologist (Medical): No    Lack of Transportation (Non-Medical): No  Physical Activity: Inactive (06/13/2022)  Exercise Vital Sign    Days of Exercise per Week: 0 days    Minutes of Exercise per Session: 0 min  Stress: No Stress Concern Present (06/13/2022)   [place]    Feeling of Stress : Not at all  Social Connections: Moderately Integrated (06/13/2022)   Social Connection and Isolation Panel [NHANES]    Frequency of Communication with Friends and Family: More than three times a week    Frequency of Social Gatherings with Friends and Family: Twice a week    Attends Religious Services: More than 4 times per year    Active Member of [employer] or Organizations: Yes    Attends Archivist Meetings: More than 4 times per year    Marital Status: Widowed     Tobacco Counseling Counseling given: Not Answered   Clinical Intake:  Pre-visit preparation completed: Yes  Pain : No/denies pain     Nutritional Risks: Other (Comment) (visit was done over the phone) Diabetes: No  How often do you need to have someone help you when you read instructions, pamphlets, or other written materials from your doctor or pharmacy?: 1 - Never What is the last grade level you completed in school?: 12th  Diabetic?No  Interpreter Needed?: No  Information entered by :: Maryelizabeth Kaufmann, [place] [employer]    10/31/2021    2:39 PM  In your present state of health, do you have any difficulty performing the following activities:  Hearing? 0  Vision? 0  Difficulty concentrating or making decisions? 0  Walking or climbing stairs? 0  Dressing or bathing? 0  Doing errands, shopping? 0    Patient Care Team: Janith Lima, MD as PCP - General (Internal Medicine) Buford Dresser, MD as PCP - Cardiology (Cardiology)  Indicate any recent Medical Services you may have received from other than [facility] providers in the past year (date may be approximate).     Assessment:   This is a routine wellness examination for Lindsey [name].  Hearing/Vision screen No results found.  Dietary issues and exercise activities discussed:     Goals Addressed   None   Depression Screen    06/13/2022   11:55 AM 10/31/2021    2:43 PM 08/28/2015    2:57 PM 03/02/2014    2:39 PM  PHQ 2/9 Scores  PHQ - 2 Score 0 0 0 0  PHQ- 9 Score 0       Fall Risk    10/31/2021    2:43 PM 08/28/2015    2:57 PM 03/02/2014    2:39 PM  [place] in the past year? 0 No No    FALL RISK PREVENTION PERTAINING TO THE HOME:  Any stairs in or around the home? Yes  If so, are there any without handrails? No  Home free of loose throw rugs in walkways, pet beds, electrical cords, etc? Yes  Adequate lighting in your home to reduce risk of falls? Yes   ASSISTIVE  DEVICES UTILIZED TO PREVENT FALLS:  Life alert? No  Use of a cane, walker or w/c? No  Grab bars in the bathroom? No  Shower chair or bench in shower? No  Elevated toilet seat or a handicapped toilet? No   TIMED UP AND GO:  Was the test performed? No .  Length of time to ambulate 10 feet:  sec.     Cognitive Function:  Immunizations Immunization History  Administered Date(s) Administered   Influenza, High Dose Seasonal PF 08/10/2014, 09/08/2018   Influenza-Unspecified 09/06/2021   Pneumococcal Conjugate-13 03/02/2014   Pneumococcal Polysaccharide-23 08/28/2015   Tdap 05/05/2007    TDAP status: Due, Education has been provided regarding the importance of this vaccine. Advised may receive this vaccine at local pharmacy or Health Dept. Aware to provide a copy of the vaccination record if obtained from local pharmacy or Health Dept. Verbalized acceptance and understanding.  Flu Vaccine status: Up to date  Pneumococcal vaccine status: Up to date  Covid-19 vaccine status: Information provided on how to obtain vaccines.   Qualifies for Shingles Vaccine? Yes   Zostavax completed No   Shingrix Completed?: No.    Education has been provided regarding the importance of this vaccine. Patient has been advised to call insurance company to determine out of pocket expense if they have not yet received this vaccine. Advised may also receive vaccine at local pharmacy or Health Dept. Verbalized acceptance and understanding.  Screening Tests Health Maintenance  Topic Date Due   COVID-19 Vaccine (1) Never done   Zoster Vaccines- Shingrix (1 of 2) Never done   TETANUS/TDAP  05/04/2017   INFLUENZA VACCINE  06/10/2022   Pneumonia Vaccine 10+ Years old  Completed   DEXA SCAN  Completed   HPV VACCINES  Aged Out    Health Maintenance  Health Maintenance Due  Topic Date Due   COVID-19 Vaccine (1) Never done   Zoster Vaccines- Shingrix (1 of 2) Never done   TETANUS/TDAP   05/04/2017   INFLUENZA VACCINE  06/10/2022    Colorectal cancer screening: No longer required.   Mammogram status: No longer required due to age.  Bone Density status: Completed 02/18/2016. Results reflect: Bone density results: OSTEOPOROSIS. Repeat every 2 years.  Lung Cancer Screening: (Low Dose CT Chest recommended if Age 86-80 years, 30 pack-year currently smoking OR have quit w/in 15years.) does not qualify.   Lung Cancer Screening Referral:   Additional Screening:  Hepatitis C Screening: does not qualify; Completed   Vision Screening: Recommended annual ophthalmology exams for early detection of glaucoma and other disorders of the eye. Is the patient up to date with their annual eye exam?  Yes  Who is the provider or what is the name of the office in which the patient attends annual eye exams? Dr. Dennison Mascot at [facility] If pt is not established with a provider, would they like to be referred to a provider to establish care? Yes .   Dental Screening: Recommended annual dental exams for proper oral hygiene  Community Resource Referral / Chronic Care Management: CRR required this visit?  No   CCM required this visit?  No      Plan:     I have personally reviewed and noted the following in the patient's chart:   Medical and social history Use of alcohol, tobacco or illicit drugs  Current medications and supplements including opioid prescriptions.  Functional ability and status Nutritional status Physical activity Advanced directives List of other physicians Hospitalizations, surgeries, and ER visits in previous 12 months Vitals Screenings to include cognitive, depression, and falls Referrals and appointments  In addition, I have reviewed and discussed with patient certain preventive protocols, quality metrics, and best practice recommendations. A written personalized care plan for preventive services as well as general preventive health recommendations were  provided to patient.     Thomes Cake, [place]   06/13/2022   Nurse Notes:  No concerns. Patient was advised that if anything changes to give our office a call.

## 2022-06-13 NOTE — Patient Instructions (Signed)
It was pleasure speaking with you today   Please follow up in 1 year

## 2022-06-17 NOTE — Progress Notes (Signed)
..   Pt understands that although there may be some limitations with this type of visit, we will take all precautions to reduce any security or privacy concerns.  Pt understands that this will be treated like an in office visit and we will file with pt's insurance, and there may be a patient responsible charge related to this service. ? ?

## 2022-06-23 NOTE — Progress Notes (Signed)
Subjective:   Lindsey [name] is a 81 y.o. female who presents for Medicare Annual (Subsequent) preventive examination.  I connected with  Lindsey [name] on 08/04/203 by a audio enabled telemedicine application and verified that I am speaking with the correct person using two identifiers.  Patient Location: Home  Provider Location: Office/Clinic  I discussed the limitations of evaluation and management by telemedicine. The patient expressed understanding and agreed to proceed.   Review of Systems: Defer to PCP Cardiac Risk Factors include: advanced age (>52mn, >>42women);hypertension;sedentary lifestyle     Objective:    There were no vitals filed for this visit. There is no height or weight on file to calculate BMI.     06/23/2022    3:47 PM 02/08/2020   11:36 AM 07/23/2016    4:53 PM 02/15/2014    8:43 AM 12/27/2013    5:00 AM  Advanced Directives  Does Patient Have a Medical Advance Directive? No No No Patient does not have advance directive;Patient would not like information Patient does not have advance directive;Patient would not like information  Would patient like information on creating a medical advance directive? No - Patient declined No - Patient declined       Current Medications (verified) Outpatient Encounter Medications as of 06/13/2022  Medication Sig   acetaminophen (TYLENOL) 650 MG CR tablet Take 650 mg by mouth every 8 (eight) hours as needed for pain.   B Complex Vitamins (B COMPLEX PO) Take by mouth.   Calcium Carbonate-Vitamin D (CALTRATE 600+D PO) Take by mouth.   cholecalciferol (VITAMIN D3) 25 MCG (1000 UNIT) tablet Take 1,000 Units by mouth daily.   Multiple Vitamins-Minerals (MULTIVITAMIN WITH MINERALS) tablet Take 1 tablet by mouth daily.   No facility-administered encounter medications on file as of 06/13/2022.    Allergies (verified) Patient has no known allergies.   History: Past Medical History:  Diagnosis Date   Breast cancer ([place]  01/03/14   left, 12 o'clock, upper outer   Radiation 03/09/14-03/31/14   Left Breast    Temporal arteritis ([facility]    Past Surgical History:  Procedure Laterality Date   ARTERY BIOPSY Right 12/27/2013   Procedure: BIOPSY TEMPORAL ARTERY;  Surgeon: EGayland Curry MD;  [place]  Service: General;  Laterality: Right;   BREAST SURGERY  01/03/14   breast biopsy   TUBAL LIGATION     many years ago   Family History  Problem Relation Age of Onset   Diabetes Paternal Uncle    Alzheimer's disease Mother    Social History   Socioeconomic History   Marital status: Widowed    Spouse name: Not on file   Number of children: Not on file   Years of education: Not on file   Highest education level: Not on file  Occupational History   Not on file  Tobacco Use   Smoking status: Never   Smokeless tobacco: Never  Substance and Sexual Activity   Alcohol use: No    Comment: rare occaisons glass of wine   Drug use: No   Sexual activity: Never    Comment: menarche age 81 PP15 first live birth age 81 menopause age 81 no HRT  Other Topics Concern   Not on file  Social History Narrative   Not on file   Social Determinants of Health   Financial Resource Strain: Low Risk  (06/13/2022)   Overall Financial Resource Strain (CARDIA)    Difficulty of Paying Living Expenses: Not hard  at all  Food Insecurity: No Food Insecurity (06/13/2022)   Hunger Vital Sign    Worried About Running Out of Food in the Last Year: Never true    Ran Out of Food in the Last Year: Never true  Transportation Needs: No Transportation Needs (06/13/2022)   PRAPARE - Hydrologist (Medical): No    Lack of Transportation (Non-Medical): No  Physical Activity: Inactive (06/13/2022)   Exercise Vital Sign    Days of Exercise per Week: 0 days    Minutes of Exercise per Session: 0 min  Stress: No Stress Concern Present (06/13/2022)   [place]    Feeling of Stress : Not at all  Social Connections: Moderately Integrated (06/13/2022)   Social Connection and Isolation Panel [NHANES]    Frequency of Communication with Friends and Family: More than three times a week    Frequency of Social Gatherings with Friends and Family: Twice a week    Attends Religious Services: More than 4 times per year    Active Member of [employer] or Organizations: Yes    Attends Archivist Meetings: More than 4 times per year    Marital Status: Widowed    Tobacco Counseling Counseling given: Not Answered   Clinical Intake:  Pre-visit preparation completed: Yes  Pain : No/denies pain     Nutritional Risks: Other (Comment) (visit was done over the phone) Diabetes: No  How often [name] you need to have someone help you when you read instructions, pamphlets, or other written materials from your doctor or pharmacy?: 1 - Never What is the last grade level you completed in school?: 12th  Diabetic?No  Interpreter Needed?: No  Information entered by :: Maryelizabeth Kaufmann, [place] [employer]    06/23/2022    3:49 PM 10/31/2021    2:39 PM  In your present state of health, [name] you have any difficulty performing the following activities:  Hearing? 0 0  Vision? 0 0  Difficulty concentrating or making decisions? 0 0  Walking or climbing stairs? 1 0  Dressing or bathing? 0 0  Doing errands, shopping? 0 0  Preparing Food and eating ? N   Using the Toilet? N   In the past six months, have you accidently leaked urine? N   [name] you have problems with loss of bowel control? N   Managing your Medications? Y   Managing your Finances? Y   Housekeeping or managing your Housekeeping? Y     Patient Care Team: Janith Lima, MD as PCP - General (Internal Medicine) Buford Dresser, MD as PCP - Cardiology (Cardiology)  Indicate any recent Medical Services you may have received from other than [facility] providers in the past year  (date may be approximate).     Assessment:   This is a routine wellness examination for Lindsey [name].  Hearing/Vision screen No results found.  Dietary issues and exercise activities discussed: Current Exercise Habits: The patient does not participate in regular exercise at present, Exercise limited by: None identified   Depression Screen    06/13/2022   11:55 AM 10/31/2021    2:43 PM 08/28/2015    2:57 PM 03/02/2014    2:39 PM  PHQ 2/9 Scores  PHQ - 2 Score 0 0 0 0  PHQ- 9 Score 0       Fall Risk    06/23/2022    3:48 PM 10/31/2021  2:43 PM 08/28/2015    2:57 PM 03/02/2014    2:39 PM  Fall Risk   Falls in the past year? 0 0 No No  Number falls in past yr: 0     Injury with Fall? 0     Risk for fall due to : No Fall Risks     Follow up Falls evaluation completed;Falls prevention discussed       FALL RISK PREVENTION PERTAINING TO THE HOME:  Any stairs in or around the home? Yes  If so, are there any without handrails? No  Home free of loose throw rugs in walkways, pet beds, electrical cords, etc? Yes  Adequate lighting in your home to reduce risk of falls? Yes   ASSISTIVE DEVICES UTILIZED TO PREVENT FALLS:  Life alert? No  Use of a cane, walker or w/c? No  Grab bars in the bathroom? No  Shower chair or bench in shower? No  Elevated toilet seat or a handicapped toilet? No   TIMED UP AND GO:  Was the test performed? No .  Length of time to ambulate 10 feet:  sec.     Cognitive Function:        06/23/2022    3:51 PM  6CIT Screen  What Year? 0 points  What month? 0 points  What time? 0 points  Count back from 20 0 points  Months in reverse 0 points  Repeat phrase 0 points  Total Score 0 points    Immunizations Immunization History  Administered Date(s) Administered   Influenza, High Dose Seasonal PF 08/10/2014, 09/08/2018   Influenza-Unspecified 09/06/2021   Pneumococcal Conjugate-13 03/02/2014   Pneumococcal Polysaccharide-23 08/28/2015   Tdap  05/05/2007    TDAP status: Due, Education has been provided regarding the importance of this vaccine. Advised may receive this vaccine at local pharmacy or Health Dept. Aware to provide a copy of the vaccination record if obtained from local pharmacy or Health Dept. Verbalized acceptance and understanding.  Flu Vaccine status: Up to date  Pneumococcal vaccine status: Up to date  Covid-19 vaccine status: Information provided on how to obtain vaccines.   Qualifies for Shingles Vaccine? Yes   Zostavax completed No   Shingrix Completed?: No.    Education has been provided regarding the importance of this vaccine. Patient has been advised to call insurance company to determine out of pocket expense if they have not yet received this vaccine. Advised may also receive vaccine at local pharmacy or Health Dept. Verbalized acceptance and understanding.  Screening Tests Health Maintenance  Topic Date Due   COVID-19 Vaccine (1) Never done   Zoster Vaccines- Shingrix (1 of 2) Never done   TETANUS/TDAP  05/04/2017   INFLUENZA VACCINE  06/10/2022   Pneumonia Vaccine 3+ Years old  Completed   DEXA SCAN  Completed   HPV VACCINES  Aged Out    Health Maintenance  Health Maintenance Due  Topic Date Due   COVID-19 Vaccine (1) Never done   Zoster Vaccines- Shingrix (1 of 2) Never done   TETANUS/TDAP  05/04/2017   INFLUENZA VACCINE  06/10/2022    Colorectal cancer screening: No longer required.   Mammogram status: No longer required due to age.  Bone Density status: Completed 02/18/2016. Results reflect: Bone density results: OSTEOPOROSIS. Repeat every 2 years.  Lung Cancer Screening: (Low Dose CT Chest recommended if Age 48-80 years, 30 pack-year currently smoking OR have quit w/in 15years.) does not qualify.   Lung Cancer Screening Referral:   Additional  Screening:  Hepatitis C Screening: does not qualify; Completed   Vision Screening: Recommended annual ophthalmology exams for early  detection of glaucoma and other disorders of the eye. Is the patient up to date with their annual eye exam?  Yes  Who is the provider or what is the name of the office in which the patient attends annual eye exams? Dr. Dennison Mascot at [facility] If pt is not established with a provider, would they like to be referred to a provider to establish care? Yes .   Dental Screening: Recommended annual dental exams for proper oral hygiene  Community Resource Referral / Chronic Care Management: CRR required this visit?  No   CCM required this visit?  No      Plan:     I have personally reviewed and noted the following in the patient's chart:   Medical and social history Use of alcohol, tobacco or illicit drugs  Current medications and supplements including opioid prescriptions.  Functional ability and status Nutritional status Physical activity Advanced directives List of other physicians Hospitalizations, surgeries, and ER visits in previous 12 months Vitals Screenings to include cognitive, depression, and falls Referrals and appointments  In addition, I have reviewed and discussed with patient certain preventive protocols, quality metrics, and best practice recommendations. A written personalized care plan for preventive services as well as general preventive health recommendations were provided to patient.    Maryelizabeth Kaufmann, [place]   06/13/2022 Henrene Dodge, RN   06/23/2022 Amended   Nurse Notes: No concerns. Patient was advised that if anything changes to give our office a call.

## 2022-08-02 ENCOUNTER — Encounter (HOSPITAL_COMMUNITY): Payer: Self-pay

## 2022-08-02 ENCOUNTER — Emergency Department (HOSPITAL_COMMUNITY)
Admission: EM | Admit: 2022-08-02 | Discharge: 2022-08-02 | Disposition: A | Discharge status: Home / Self Care | Payer: Medicare HMO | Attending: Emergency Medicine | Admitting: Emergency Medicine

## 2022-08-02 ENCOUNTER — Ambulatory Visit
Admission: EM | Admit: 2022-08-02 | Discharge: 2022-08-02 | Disposition: A | Discharge status: Other Acute Inpatient Hospital | Payer: Medicare HMO

## 2022-08-02 ENCOUNTER — Other Ambulatory Visit: Payer: Self-pay

## 2022-08-02 DIAGNOSIS — R197 Diarrhea, unspecified: Secondary | ICD-10-CM | POA: Diagnosis not present

## 2022-08-02 LAB — CBC WITH DIFFERENTIAL/PLATELET
Abs Immature Granulocytes: 0.01 10*3/uL (ref 0.00–0.07)
Basophils Absolute: 0.1 10*3/uL (ref 0.0–0.1)
Basophils Relative: 1 %
Eosinophils Absolute: 0 10*3/uL (ref 0.0–0.5)
Eosinophils Relative: 1 %
HCT: 40.4 % (ref 36.0–46.0)
Hemoglobin: 13.3 g/dL (ref 12.0–15.0)
Immature Granulocytes: 0 %
Lymphocytes Relative: 19 %
Lymphs Abs: 1.3 10*3/uL (ref 0.7–4.0)
MCH: 29 pg (ref 26.0–34.0)
MCHC: 32.9 g/dL (ref 30.0–36.0)
MCV: 88.2 fL (ref 80.0–100.0)
Monocytes Absolute: 0.6 10*3/uL (ref 0.1–1.0)
Monocytes Relative: 9 %
Neutro Abs: 4.8 10*3/uL (ref 1.7–7.7)
Neutrophils Relative %: 70 %
Platelets: 314 10*3/uL (ref 150–400)
RBC: 4.58 MIL/uL (ref 3.87–5.11)
RDW: 14.3 % (ref 11.5–15.5)
WBC: 6.8 10*3/uL (ref 4.0–10.5)
nRBC: 0 % (ref 0.0–0.2)

## 2022-08-02 LAB — BASIC METABOLIC PANEL
Anion gap: 9 (ref 5–15)
BUN: 8 mg/dL (ref 8–23)
CO2: 24 mmol/L (ref 22–32)
Calcium: 9.5 mg/dL (ref 8.9–10.3)
Chloride: 103 mmol/L (ref 98–111)
Creatinine, Ser: 0.7 mg/dL (ref 0.44–1.00)
GFR, Estimated: >60 mL/min (ref >60)
Glucose, Bld: 111 mg/dL — ABNORMAL HIGH (ref 70–99)
Potassium: 3.9 mmol/L (ref 3.5–5.1)
Sodium: 136 mmol/L (ref 135–145)

## 2022-08-02 MED ORDER — LOPERAMIDE HCL 2 MG PO CAPS: 2.0000 mg | ORAL_CAPSULE | Freq: Four times a day (QID) | ORAL | 0 refills | Status: DC | PRN

## 2022-08-02 MED ORDER — METAMUCIL 0.52 G PO CAPS: 0.5200 g | ORAL_CAPSULE | Freq: Every day | ORAL | 0 refills | Status: DC

## 2022-08-02 NOTE — ED Triage Notes (Signed)
Diarrhea x2 weeks.  Denies recent abt usage.  Denies abd pain.

## 2022-08-02 NOTE — ED Provider Triage Note (Signed)
Emergency Medicine Provider Triage Evaluation Note  Lindsey [name] , [name] 81 y.o. female  was evaluated in triage.  Pt complains of diarrhea x 2 weeks. Has tried Kaopectate with relief of her symptoms. No abdominal pain, nausea, vomiting. Denies sick contacts.   Review of Systems  Positive:  Negative:   Physical Exam  BP (!) 134/90   Pulse 95   Temp 97.9 F (36.6 C)   Resp 17   Ht '5\' 6"'$  (1.676 m)   Wt 61 kg   SpO2 98%   BMI 21.71 kg/m  Gen:   Awake, no distress   Resp:  Normal effort  MSK:   Moves extremities without difficulty  Other:    Medical Decision Making  Medically screening exam initiated at 12:29 PM.  Appropriate orders placed.  Lindsey [name] was informed that the remainder of the evaluation will be completed by another provider, this initial triage assessment does not replace that evaluation, and the importance of remaining in the ED until their evaluation is complete.  Work-up initiated.    Lindsey Criss A, PA-C 08/02/22 1238

## 2022-08-02 NOTE — ED Provider Notes (Signed)
[facility] DEPT Provider Note   CSN: [id number] Arrival date & time: 08/02/22  1117     History  Chief Complaint  Patient presents with   Diarrhea    Lindsey [name] is a 81 y.o. female.  Patient is an 81 year old female with no significant past medical history, only on supplemental vitamins at home presenting to the emergency department with diarrhea.  The patient states that she has had diarrhea for the last 2 weeks.  She states that some days she will have several episodes of diarrhea and some days she will only have a few.  She states that occasionally it is watery and other times it is loose stools.  She denies any associated abdominal pain, fevers or chills, nausea or vomiting.  She denies any black or bloody stools.  She denies any recent travel, recent hospitalizations or recent antibiotic use.  She denies any known sick contacts.  She denies any associated URI symptoms.  She states that she has been taking Kaopectate which helps with her symptoms but the diarrhea will return once it is worn off.  She states that she tried eating less and having clear liquid diet but states that this had no improvement of her diarrhea.  He denies any prior abdominal surgeries.  The history is provided by the patient.  Diarrhea      Home Medications Prior to Admission medications   Medication Sig Start Date End Date Taking? Authorizing Provider  acetaminophen (TYLENOL) 650 MG CR tablet Take 650 mg by mouth every 8 (eight) hours as needed for pain.    [provider]  B Complex Vitamins (B COMPLEX PO) Take by mouth.    [provider]  Calcium Carbonate-Vitamin D (CALTRATE 600+D PO) Take by mouth.    [provider]  cholecalciferol (VITAMIN D3) 25 MCG (1000 UNIT) tablet Take 1,000 Units by mouth daily.    [provider]  Multiple Vitamins-Minerals (MULTIVITAMIN WITH MINERALS) tablet Take 1 tablet by mouth daily.    [provider]      Allergies    Patient has no known allergies.    Review of Systems   Review of Systems  Gastrointestinal:  Positive for diarrhea.    Physical Exam Updated Vital Signs BP (!) 152/97   Pulse 92   Temp 97.9 F (36.6 C)   Resp 17   Ht '5\' 6"'$  (1.676 m)   Wt 61 kg   SpO2 99%   BMI 21.71 kg/m  Physical Exam Vitals and nursing note reviewed.  Constitutional:      General: She is not in acute distress.    Appearance: Normal appearance.  HENT:     Head: Normocephalic and atraumatic.     Nose: Nose normal.     Mouth/Throat:     Mouth: Mucous membranes are moist.     Pharynx: Oropharynx is clear.  Eyes:     Extraocular Movements: Extraocular movements intact.     Conjunctiva/sclera: Conjunctivae normal.  Cardiovascular:     Rate and Rhythm: Normal rate and regular rhythm.     Pulses: Normal pulses.     Heart sounds: Normal heart sounds.  Pulmonary:     Effort: Pulmonary effort is normal.     Breath sounds: Normal breath sounds.  Abdominal:     General: Abdomen is flat.     Palpations: Abdomen is soft.     Tenderness: There is no abdominal tenderness.  Musculoskeletal:  General: Normal range of motion.     Cervical back: Normal range of motion and neck supple.  Skin:    General: Skin is warm and dry.  Neurological:     General: No focal deficit present.     Mental Status: She is alert and oriented to person, place, and time.  Psychiatric:        Mood and Affect: Mood normal.        Behavior: Behavior normal.     ED Results / Procedures / Treatments   Labs (all labs ordered are listed, but only abnormal results are displayed) Labs Reviewed  BASIC METABOLIC PANEL - Abnormal; Notable for the following components:      Result Value   Glucose, Bld 111 (*)    All other components within normal limits  CBC WITH DIFFERENTIAL/PLATELET    EKG None  Radiology No results found.  Procedures Procedures    Medications Ordered in  ED Medications - No data to display  ED Course/ Medical Decision Making/ A&P                           Medical Decision Making This patient presents to the ED with chief complaint(s) of diarrhea with no pertinent past medical history which further complicates the presenting complaint. The complaint involves an extensive differential diagnosis and also carries with it a high risk of complications and morbidity.    The differential diagnosis includes patient has no risk factors for C. difficile or other infectious etiologies of diarrhea, she has not had any black or bloody stools and is not anemic making GI bleed unlikely cause of her diarrhea, she has no associated abdominal pain making intra-abdominal infection unlikely cause of her diarrhea, concerning for possible dehydration with the chronicity of her diarrhea  Additional history obtained: Additional history obtained from N/A Records reviewed Primary Care Documents  ED Course and Reassessment: Patient was initially evaluated by provider in triage and had labs to evaluate for dehydration or anemia.  Her labs are within normal range.  The patient is asymptomatic at this time.  She was offered stool studies in the ER but states that she does not feel like she needs to have a bowel movement.  Patient will be given cup to obtain sample at home for outpatient testing and will be referred to her primary doctor for further work-up.  She was recommended to continue probiotics, add fiber supplement and that she can take loperamide as needed as I have low suspicion for infectious etiology.  She is overall well-appearing and is stable for discharge home.  Independent labs interpretation:  The following labs were independently interpreted: Within normal range  Independent visualization of imaging: N/A  Consultation: - Consulted or discussed management/test interpretation w/ external professional: N/A  Consideration for admission or further workup:  Considered stool studies but patient is unable to give sample in the ER and will be for referred to PCP for outpatient stool studies and further work-up Social Determinants of health: N/A            Final Clinical Impression(s) / ED Diagnoses Final diagnoses:  None    Rx / DC Orders ED Discharge Orders     None         Ottie Glazier, DO 08/02/22 1509

## 2022-08-02 NOTE — Discharge Instructions (Addendum)
You were seen in the emergency department for your diarrhea.  You had no signs of severe dehydration and no risk factors for infection.  Due to how long you have had the diarrhea though we recommend that you follow-up with your primary doctor to have stool studies.  We did give you a cup to obtain a sample at home and you should bring this to your primary doctor to be evaluated.  You should continue to take the probiotics, you should also take fiber supplement and you can take Imodium as needed for diarrhea.  I have also given you a list of food choices to help with diarrhea.  You should return to the emergency department if you have fevers, you develop abdominal pain, you have blood in your stools, you are feeling dehydrated and you become lightheaded or pass out or if you have any other new or concerning symptoms.

## 2022-09-15 DIAGNOSIS — Z961 Presence of intraocular lens: Secondary | ICD-10-CM | POA: Diagnosis not present

## 2022-09-15 DIAGNOSIS — G43809 Other migraine, not intractable, without status migrainosus: Secondary | ICD-10-CM | POA: Diagnosis not present

## 2022-09-15 DIAGNOSIS — H04123 Dry eye syndrome of bilateral lacrimal glands: Secondary | ICD-10-CM | POA: Diagnosis not present

## 2022-09-15 DIAGNOSIS — H43813 Vitreous degeneration, bilateral: Secondary | ICD-10-CM | POA: Diagnosis not present

## 2023-05-29 ENCOUNTER — Telehealth: Payer: Self-pay | Admitting: *Deleted

## 2023-05-29 NOTE — Telephone Encounter (Signed)
I attempted to contact patient by telephone but was unsuccessful. According to the patient's chart they are due for follow up  with [facility]. I have left a HIPAA compliant message advising the patient to contact [facility] at [phone]. I will continue to follow up with the patient to make sure this appointment is scheduled.

## 2023-06-04 NOTE — Telephone Encounter (Signed)
I attempted to contact patient by telephone but was unsuccessful. According to the patient's chart they are due for follow up with  [facility]. I have left a HIPAA compliant message advising the patient to contact [facility] at [phone]. I will continue to follow up with the patient to make sure this appointment is scheduled.

## 2023-07-20 DIAGNOSIS — H04123 Dry eye syndrome of bilateral lacrimal glands: Secondary | ICD-10-CM | POA: Diagnosis not present

## 2023-07-20 DIAGNOSIS — Z961 Presence of intraocular lens: Secondary | ICD-10-CM | POA: Diagnosis not present

## 2023-07-20 DIAGNOSIS — H43813 Vitreous degeneration, bilateral: Secondary | ICD-10-CM | POA: Diagnosis not present

## 2023-07-21 ENCOUNTER — Ambulatory Visit (INDEPENDENT_AMBULATORY_CARE_PROVIDER_SITE_OTHER): Payer: Medicare HMO | Admitting: Internal Medicine

## 2023-07-21 ENCOUNTER — Encounter: Payer: Self-pay | Admitting: Internal Medicine

## 2023-07-21 VITALS — BP 164/92 | HR 95 | Temp 98.1°F | Resp 16 | Ht 66.0 in | Wt 130.0 lb

## 2023-07-21 DIAGNOSIS — Z23 Encounter for immunization: Secondary | ICD-10-CM | POA: Diagnosis not present

## 2023-07-21 DIAGNOSIS — R0609 Other forms of dyspnea: Secondary | ICD-10-CM

## 2023-07-21 DIAGNOSIS — R739 Hyperglycemia, unspecified: Secondary | ICD-10-CM

## 2023-07-21 DIAGNOSIS — E785 Hyperlipidemia, unspecified: Secondary | ICD-10-CM

## 2023-07-21 DIAGNOSIS — I10 Essential (primary) hypertension: Secondary | ICD-10-CM

## 2023-07-21 DIAGNOSIS — Z0000 Encounter for general adult medical examination without abnormal findings: Secondary | ICD-10-CM | POA: Diagnosis not present

## 2023-07-21 DIAGNOSIS — Z0001 Encounter for general adult medical examination with abnormal findings: Secondary | ICD-10-CM | POA: Insufficient documentation

## 2023-07-21 LAB — CBC WITH DIFFERENTIAL/PLATELET
Basophils Absolute: 0.1 10*3/uL (ref 0.0–0.1)
Basophils Relative: 1.1 % (ref 0.0–3.0)
Eosinophils Absolute: 0.1 10*3/uL (ref 0.0–0.7)
Eosinophils Relative: 2.2 % (ref 0.0–5.0)
HCT: 41.7 % (ref 36.0–46.0)
Hemoglobin: 13.5 g/dL (ref 12.0–15.0)
Lymphocytes Relative: 24.5 % (ref 12.0–46.0)
Lymphs Abs: 1.5 10*3/uL (ref 0.7–4.0)
MCHC: 32.3 g/dL (ref 30.0–36.0)
MCV: 87.8 fl (ref 78.0–100.0)
Monocytes Absolute: 0.7 10*3/uL (ref 0.1–1.0)
Monocytes Relative: 11.4 % (ref 3.0–12.0)
Neutro Abs: 3.8 10*3/uL (ref 1.4–7.7)
Neutrophils Relative %: 60.8 % (ref 43.0–77.0)
Platelets: 296 10*3/uL (ref 150.0–400.0)
RBC: 4.75 Mil/uL (ref 3.87–5.11)
RDW: 14.6 % (ref 11.5–15.5)
WBC: 6.2 10*3/uL (ref 4.0–10.5)

## 2023-07-21 LAB — URINALYSIS, ROUTINE W REFLEX MICROSCOPIC
Bilirubin Urine: NEGATIVE
Hgb urine dipstick: NEGATIVE
Ketones, ur: NEGATIVE
Nitrite: NEGATIVE
RBC / HPF: NONE SEEN (ref 0–?)
Specific Gravity, Urine: <=1.005 — AB (ref 1.000–1.030)
Total Protein, Urine: NEGATIVE
Urine Glucose: NEGATIVE
Urobilinogen, UA: 0.2 (ref 0.0–1.0)
pH: 6 (ref 5.0–8.0)

## 2023-07-21 LAB — HEMOGLOBIN A1C: Hgb A1c MFr Bld: 5.9 % (ref 4.6–6.5)

## 2023-07-21 LAB — TROPONIN I (HIGH SENSITIVITY): High Sens Troponin I: 6 ng/L (ref 2–17)

## 2023-07-21 LAB — TSH: TSH: 2.98 u[IU]/mL (ref 0.35–5.50)

## 2023-07-21 LAB — BRAIN NATRIURETIC PEPTIDE: Pro B Natriuretic peptide (BNP): 157 pg/mL — ABNORMAL HIGH (ref 0.0–100.0)

## 2023-07-21 MED ORDER — BOOSTRIX 5-2.5-18.5 LF-MCG/0.5 IM SUSP: 0.5000 mL | Freq: Once | INTRAMUSCULAR | 0 refills | Status: AC

## 2023-07-21 MED ORDER — SHINGRIX 50 MCG/0.5ML IM SUSR: 0.5000 mL | Freq: Once | INTRAMUSCULAR | 1 refills | Status: AC

## 2023-07-21 NOTE — Progress Notes (Unsigned)
Subjective:  Patient ID: Lindsey [name], female    DOB: 04/04/1941  Age: 82 y.o. MRN: [id number]  CC: Annual Exam and Hypertension   HPI Lindsey [name] presents for a CPX and f/up ----  Discussed the use of AI scribe software for clinical note transcription with the patient, who gave verbal consent to proceed.  History of Present Illness   The patient reports feeling generally well, with a noted improvement in overall health over the past three to four weeks. However, they continue to experience difficulty with mobility due to persistent pain in the left knee and right foot and ankle. Despite these issues, the patient denies experiencing chest pain or shortness of breath during activity, although they do note feeling slightly winded after climbing stairs.  The patient has been experiencing frequent diarrhea for several years, with a similar issue occurring around the same time the previous year. They have found relief with over-the-counter Imodium and deny any presence of blood in the stool or abdominal pain. Despite this, the patient has unintentionally lost five pounds over the past year, attributing this to a lack of appetite and consumption of small meals.  The patient also reports persistent swelling in the right ankle and foot. Despite these ongoing issues, the patient declined an EKG to investigate potential cardiac causes for their symptoms, citing previous negative experiences with the test. They did, however, consent to lab work.  Finally, the patient expressed a desire for a handicap parking space due to difficulty walking and associated pain in the legs and feet. They also report shortness of breath after walking, but attribute this primarily to foot pain.       Outpatient Medications Prior to Visit  Medication Sig Dispense Refill   acetaminophen (TYLENOL) 650 MG CR tablet Take 650 mg by mouth every 8 (eight) hours as needed for pain.     B Complex Vitamins (B COMPLEX PO)  Take by mouth.     Calcium Carbonate-Vitamin D (CALTRATE 600+D PO) Take by mouth.     cholecalciferol (VITAMIN D3) 25 MCG (1000 UNIT) tablet Take 1,000 Units by mouth daily.     loperamide (IMODIUM) 2 MG capsule Take 1 capsule (2 mg total) by mouth 4 (four) times daily as needed for diarrhea or loose stools. 12 capsule 0   Multiple Vitamins-Minerals (MULTIVITAMIN WITH MINERALS) tablet Take 1 tablet by mouth daily.     psyllium (METAMUCIL) 0.52 g capsule Take 1 capsule (0.52 g total) by mouth daily. 30 capsule 0   No facility-administered medications prior to visit.    ROS Review of Systems  Respiratory:  Positive for shortness of breath (DOE).   Musculoskeletal:  Positive for arthralgias. Negative for back pain and myalgias.    Objective:  BP (!) 164/92 (BP Location: Left Arm, Patient Position: Sitting, Cuff Size: Large)   Pulse 95   Temp 98.1 F (36.7 C) (Oral)   Resp 16   Ht 5\' 6"  (1.676 m)   Wt 130 lb (59 kg)   SpO2 98%   BMI 20.98 kg/m   BP Readings from Last 3 Encounters:  07/21/23 (!) 164/92  08/02/22 (!) 152/97  05/14/22 130/90    Wt Readings from Last 3 Encounters:  07/21/23 130 lb (59 kg)  08/02/22 134 lb 7.7 oz (61 kg)  05/14/22 135 lb (61.2 kg)    Physical Exam Cardiovascular:     Comments: She refused to undergo an EKG today Musculoskeletal:     Right lower leg: No  edema.     Left lower leg: No edema.     Lab Results  Component Value Date   WBC 6.8 08/02/2022   HGB 13.3 08/02/2022   HCT 40.4 08/02/2022   PLT 314 08/02/2022   GLUCOSE 111 (H) 08/02/2022   CHOL 175 05/14/2022   TRIG 73.0 05/14/2022   HDL 73.90 05/14/2022   LDLCALC 86 05/14/2022   ALT 8 06/04/2022   AST 22 06/04/2022   NA 136 08/02/2022   K 3.9 08/02/2022   CL 103 08/02/2022   CREATININE 0.70 08/02/2022   BUN 8 08/02/2022   CO2 24 08/02/2022   TSH 3.38 05/14/2022   HGBA1C 5.9 05/14/2022    No results found.  Assessment & Plan:  DOE (dyspnea on exertion) -     Brain  natriuretic peptide; Future -     Troponin I (High Sensitivity); Future -     CBC with Differential/Platelet; Future  Hyperglycemia -     Basic metabolic panel; Future -     Hemoglobin A1c; Future  Essential hypertension -     TSH; Future -     Urinalysis, Routine w reflex microscopic; Future -     Hepatic function panel; Future -     CBC with Differential/Platelet; Future  Encounter for general adult medical examination with abnormal findings  Need for prophylactic vaccination with combined diphtheria-tetanus-pertussis (DTP) vaccine -     Boostrix; Inject 0.5 mLs into the muscle once for 1 dose.  Dispense: 0.5 mL; Refill: 0  Need for vaccination for zoster -     Shingrix; Inject 0.5 mLs into the muscle once for 1 dose.  Dispense: 0.5 mL; Refill: 1     Follow-up: Return in about 6 months (around 01/18/2024).  Sanda Linger, MD

## 2023-07-21 NOTE — Patient Instructions (Signed)

## 2023-07-22 ENCOUNTER — Encounter: Payer: Self-pay | Admitting: Internal Medicine

## 2023-07-22 LAB — HEPATIC FUNCTION PANEL
ALT: 9 U/L (ref 0–35)
AST: 19 U/L (ref 0–37)
Albumin: 4.2 g/dL (ref 3.5–5.2)
Alkaline Phosphatase: 114 U/L (ref 39–117)
Bilirubin, Direct: 0.2 mg/dL (ref 0.0–0.3)
Total Bilirubin: 0.7 mg/dL (ref 0.2–1.2)
Total Protein: 7.4 g/dL (ref 6.0–8.3)

## 2023-07-22 LAB — BASIC METABOLIC PANEL
BUN: 15 mg/dL (ref 6–23)
CO2: 28 meq/L (ref 19–32)
Calcium: 9.9 mg/dL (ref 8.4–10.5)
Chloride: 101 meq/L (ref 96–112)
Creatinine, Ser: 0.75 mg/dL (ref 0.40–1.20)
GFR: 73.94 mL/min (ref >60.00)
Glucose, Bld: 94 mg/dL (ref 70–99)
Potassium: 4.4 meq/L (ref 3.5–5.1)
Sodium: 137 meq/L (ref 135–145)

## 2023-09-15 NOTE — Progress Notes (Unsigned)
    Subjective:    Patient ID: Lindsey [name], female    DOB: 1941-09-12, 82 y.o.   MRN: [id number]      HPI Lindsey [name] is here for No chief complaint on file.   Diarrhea -  ED 9/23 for diarrhea.  Was having diarrhea x 2 weeks.  Some days had several episodes and some days a few episodes.  Some days loose and others watery.  No abdominal pain, f/c, n/v.  No blood in stools or black stool.  No travel, hospitalization or recent abx use.  Was taking kaopectate which helped temporarily.  Tried eating less and doing a clear liquid diet.       Medications and allergies reviewed with patient and updated if appropriate.  Current Outpatient Medications on File Prior to Visit  Medication Sig Dispense Refill   acetaminophen (TYLENOL) 650 MG CR tablet Take 650 mg by mouth every 8 (eight) hours as needed for pain.     B Complex Vitamins (B COMPLEX PO) Take by mouth.     Calcium Carbonate-Vitamin D (CALTRATE 600+D PO) Take by mouth.     cholecalciferol (VITAMIN D3) 25 MCG (1000 UNIT) tablet Take 1,000 Units by mouth daily.     loperamide (IMODIUM) 2 MG capsule Take 1 capsule (2 mg total) by mouth 4 (four) times daily as needed for diarrhea or loose stools. 12 capsule 0   Multiple Vitamins-Minerals (MULTIVITAMIN WITH MINERALS) tablet Take 1 tablet by mouth daily.     psyllium (METAMUCIL) 0.52 g capsule Take 1 capsule (0.52 g total) by mouth daily. 30 capsule 0   No current facility-administered medications on file prior to visit.    Review of Systems     Objective:  There were no vitals filed for this visit. BP Readings from Last 3 Encounters:  07/21/23 (!) 164/92  08/02/22 (!) 152/97  05/14/22 130/90   Wt Readings from Last 3 Encounters:  07/21/23 130 lb (59 kg)  08/02/22 134 lb 7.7 oz (61 kg)  05/14/22 135 lb (61.2 kg)   There is no height or weight on file to calculate BMI.    Physical Exam         Assessment & Plan:    See Problem List for Assessment and Plan of chronic  medical problems.

## 2023-09-15 NOTE — Patient Instructions (Incomplete)
      Blood work was ordered.   The lab is on the first floor.    Medications changes include :   none    A referral was ordered for [facility] and someone will call you to schedule an appointment.

## 2023-09-16 ENCOUNTER — Encounter: Payer: Self-pay | Admitting: Internal Medicine

## 2023-09-16 ENCOUNTER — Ambulatory Visit (INDEPENDENT_AMBULATORY_CARE_PROVIDER_SITE_OTHER): Payer: Medicare HMO | Admitting: Internal Medicine

## 2023-09-16 VITALS — BP 146/84 | HR 112 | Temp 97.9°F | Ht 66.0 in | Wt 122.0 lb

## 2023-09-16 DIAGNOSIS — R634 Abnormal weight loss: Secondary | ICD-10-CM

## 2023-09-16 DIAGNOSIS — R197 Diarrhea, unspecified: Secondary | ICD-10-CM | POA: Diagnosis not present

## 2023-09-16 LAB — COMPREHENSIVE METABOLIC PANEL
ALT: 4 U/L (ref 0–35)
AST: 13 U/L (ref 0–37)
Albumin: 4.1 g/dL (ref 3.5–5.2)
Alkaline Phosphatase: 106 U/L (ref 39–117)
BUN: 13 mg/dL (ref 6–23)
CO2: 26 meq/L (ref 19–32)
Calcium: 9.6 mg/dL (ref 8.4–10.5)
Chloride: 101 meq/L (ref 96–112)
Creatinine, Ser: 0.9 mg/dL (ref 0.40–1.20)
GFR: 59.34 mL/min — ABNORMAL LOW (ref >60.00)
Glucose, Bld: 117 mg/dL — ABNORMAL HIGH (ref 70–99)
Potassium: 3.8 meq/L (ref 3.5–5.1)
Sodium: 137 meq/L (ref 135–145)
Total Bilirubin: 0.7 mg/dL (ref 0.2–1.2)
Total Protein: 6.8 g/dL (ref 6.0–8.3)

## 2023-09-16 LAB — CBC WITH DIFFERENTIAL/PLATELET
Basophils Absolute: 0 10*3/uL (ref 0.0–0.1)
Basophils Relative: 0.7 % (ref 0.0–3.0)
Eosinophils Absolute: 0 10*3/uL (ref 0.0–0.7)
Eosinophils Relative: 0.4 % (ref 0.0–5.0)
HCT: 42.8 % (ref 36.0–46.0)
Hemoglobin: 13.6 g/dL (ref 12.0–15.0)
Lymphocytes Relative: 19.1 % (ref 12.0–46.0)
Lymphs Abs: 1.1 10*3/uL (ref 0.7–4.0)
MCHC: 31.8 g/dL (ref 30.0–36.0)
MCV: 87.7 fL (ref 78.0–100.0)
Monocytes Absolute: 0.7 10*3/uL (ref 0.1–1.0)
Monocytes Relative: 11.7 % (ref 3.0–12.0)
Neutro Abs: 4 10*3/uL (ref 1.4–7.7)
Neutrophils Relative %: 68.1 % (ref 43.0–77.0)
Platelets: 324 10*3/uL (ref 150.0–400.0)
RBC: 4.88 Mil/uL (ref 3.87–5.11)
RDW: 14.4 % (ref 11.5–15.5)
WBC: 5.9 10*3/uL (ref 4.0–10.5)

## 2023-09-16 LAB — TSH: TSH: 3 u[IU]/mL (ref 0.35–5.50)

## 2023-09-21 LAB — GI PROFILE, STOOL, PCR

## 2023-10-06 DIAGNOSIS — K529 Noninfective gastroenteritis and colitis, unspecified: Secondary | ICD-10-CM | POA: Diagnosis not present

## 2023-10-12 DIAGNOSIS — K529 Noninfective gastroenteritis and colitis, unspecified: Secondary | ICD-10-CM | POA: Diagnosis not present

## 2023-12-04 DIAGNOSIS — R194 Change in bowel habit: Secondary | ICD-10-CM | POA: Diagnosis not present

## 2023-12-04 DIAGNOSIS — K529 Noninfective gastroenteritis and colitis, unspecified: Secondary | ICD-10-CM | POA: Diagnosis not present

## 2024-04-29 DIAGNOSIS — R519 Headache, unspecified: Secondary | ICD-10-CM | POA: Diagnosis not present

## 2024-04-29 DIAGNOSIS — H34832 Tributary (branch) retinal vein occlusion, left eye, with macular edema: Secondary | ICD-10-CM | POA: Diagnosis not present

## 2024-04-29 DIAGNOSIS — H04123 Dry eye syndrome of bilateral lacrimal glands: Secondary | ICD-10-CM | POA: Diagnosis not present

## 2024-05-09 DIAGNOSIS — H43813 Vitreous degeneration, bilateral: Secondary | ICD-10-CM | POA: Diagnosis not present

## 2024-05-09 DIAGNOSIS — Z961 Presence of intraocular lens: Secondary | ICD-10-CM | POA: Diagnosis not present

## 2024-05-09 DIAGNOSIS — H34832 Tributary (branch) retinal vein occlusion, left eye, with macular edema: Secondary | ICD-10-CM | POA: Diagnosis not present

## 2024-05-09 DIAGNOSIS — H35033 Hypertensive retinopathy, bilateral: Secondary | ICD-10-CM | POA: Diagnosis not present

## 2024-05-09 DIAGNOSIS — H35363 Drusen (degenerative) of macula, bilateral: Secondary | ICD-10-CM | POA: Diagnosis not present

## 2024-05-18 DIAGNOSIS — H34832 Tributary (branch) retinal vein occlusion, left eye, with macular edema: Secondary | ICD-10-CM | POA: Diagnosis not present

## 2024-05-18 DIAGNOSIS — R519 Headache, unspecified: Secondary | ICD-10-CM | POA: Diagnosis not present

## 2024-06-07 DIAGNOSIS — H35033 Hypertensive retinopathy, bilateral: Secondary | ICD-10-CM | POA: Diagnosis not present

## 2024-06-07 DIAGNOSIS — Z961 Presence of intraocular lens: Secondary | ICD-10-CM | POA: Diagnosis not present

## 2024-06-07 DIAGNOSIS — H43813 Vitreous degeneration, bilateral: Secondary | ICD-10-CM | POA: Diagnosis not present

## 2024-06-07 DIAGNOSIS — H34832 Tributary (branch) retinal vein occlusion, left eye, with macular edema: Secondary | ICD-10-CM | POA: Diagnosis not present

## 2024-06-07 DIAGNOSIS — H35363 Drusen (degenerative) of macula, bilateral: Secondary | ICD-10-CM | POA: Diagnosis not present

## 2024-07-20 DIAGNOSIS — H52222 Regular astigmatism, left eye: Secondary | ICD-10-CM | POA: Diagnosis not present

## 2024-07-20 DIAGNOSIS — H524 Presbyopia: Secondary | ICD-10-CM | POA: Diagnosis not present

## 2024-07-26 ENCOUNTER — Encounter: Payer: Self-pay | Admitting: Emergency Medicine

## 2024-07-26 ENCOUNTER — Inpatient Hospital Stay (HOSPITAL_COMMUNITY)
Admission: EM | Admit: 2024-07-26 | Discharge: 2024-07-31 | DRG: 300 | Disposition: A | Discharge status: Home / Self Care | Source: Other Acute Inpatient Hospital | Attending: Internal Medicine | Admitting: Internal Medicine

## 2024-07-26 ENCOUNTER — Ambulatory Visit
Admission: EM | Admit: 2024-07-26 | Discharge: 2024-07-26 | Disposition: A | Discharge status: Home / Self Care | Attending: Family Medicine | Admitting: Family Medicine

## 2024-07-26 ENCOUNTER — Emergency Department (HOSPITAL_COMMUNITY)

## 2024-07-26 ENCOUNTER — Encounter (HOSPITAL_COMMUNITY): Payer: Self-pay | Admitting: Emergency Medicine

## 2024-07-26 DIAGNOSIS — R0602 Shortness of breath: Secondary | ICD-10-CM | POA: Diagnosis not present

## 2024-07-26 DIAGNOSIS — I82A12 Acute embolism and thrombosis of left axillary vein: Secondary | ICD-10-CM | POA: Diagnosis present

## 2024-07-26 DIAGNOSIS — Z833 Family history of diabetes mellitus: Secondary | ICD-10-CM

## 2024-07-26 DIAGNOSIS — Z170 Estrogen receptor positive status [ER+]: Secondary | ICD-10-CM

## 2024-07-26 DIAGNOSIS — I82C12 Acute embolism and thrombosis of left internal jugular vein: Secondary | ICD-10-CM | POA: Diagnosis present

## 2024-07-26 DIAGNOSIS — I729 Aneurysm of unspecified site: Secondary | ICD-10-CM

## 2024-07-26 DIAGNOSIS — I700 Atherosclerosis of aorta: Secondary | ICD-10-CM | POA: Diagnosis not present

## 2024-07-26 DIAGNOSIS — D649 Anemia, unspecified: Secondary | ICD-10-CM | POA: Diagnosis not present

## 2024-07-26 DIAGNOSIS — Z820 Family history of epilepsy and other diseases of the nervous system: Secondary | ICD-10-CM

## 2024-07-26 DIAGNOSIS — I82409 Acute embolism and thrombosis of unspecified deep veins of unspecified lower extremity: Secondary | ICD-10-CM | POA: Diagnosis present

## 2024-07-26 DIAGNOSIS — Z923 Personal history of irradiation: Secondary | ICD-10-CM | POA: Diagnosis not present

## 2024-07-26 DIAGNOSIS — I748 Embolism and thrombosis of other arteries: Principal | ICD-10-CM

## 2024-07-26 DIAGNOSIS — I7122 Aneurysm of the aortic arch, without rupture: Secondary | ICD-10-CM | POA: Diagnosis not present

## 2024-07-26 DIAGNOSIS — R609 Edema, unspecified: Secondary | ICD-10-CM | POA: Diagnosis not present

## 2024-07-26 DIAGNOSIS — M7989 Other specified soft tissue disorders: Secondary | ICD-10-CM

## 2024-07-26 DIAGNOSIS — R230 Cyanosis: Secondary | ICD-10-CM | POA: Diagnosis not present

## 2024-07-26 DIAGNOSIS — H9190 Unspecified hearing loss, unspecified ear: Secondary | ICD-10-CM | POA: Diagnosis not present

## 2024-07-26 DIAGNOSIS — Z7901 Long term (current) use of anticoagulants: Secondary | ICD-10-CM | POA: Diagnosis not present

## 2024-07-26 DIAGNOSIS — Z66 Do not resuscitate: Secondary | ICD-10-CM | POA: Diagnosis present

## 2024-07-26 DIAGNOSIS — R000 Tachycardia, unspecified: Secondary | ICD-10-CM | POA: Diagnosis present

## 2024-07-26 DIAGNOSIS — I7121 Aneurysm of the ascending aorta, without rupture: Secondary | ICD-10-CM

## 2024-07-26 DIAGNOSIS — E871 Hypo-osmolality and hyponatremia: Secondary | ICD-10-CM | POA: Insufficient documentation

## 2024-07-26 DIAGNOSIS — Z853 Personal history of malignant neoplasm of breast: Secondary | ICD-10-CM | POA: Diagnosis not present

## 2024-07-26 DIAGNOSIS — I82B12 Acute embolism and thrombosis of left subclavian vein: Principal | ICD-10-CM | POA: Diagnosis present

## 2024-07-26 DIAGNOSIS — Z79899 Other long term (current) drug therapy: Secondary | ICD-10-CM

## 2024-07-26 DIAGNOSIS — Z9221 Personal history of antineoplastic chemotherapy: Secondary | ICD-10-CM

## 2024-07-26 DIAGNOSIS — M316 Other giant cell arteritis: Secondary | ICD-10-CM | POA: Diagnosis not present

## 2024-07-26 DIAGNOSIS — I82622 Acute embolism and thrombosis of deep veins of left upper extremity: Secondary | ICD-10-CM | POA: Diagnosis not present

## 2024-07-26 DIAGNOSIS — R079 Chest pain, unspecified: Secondary | ICD-10-CM | POA: Diagnosis not present

## 2024-07-26 DIAGNOSIS — Z9851 Tubal ligation status: Secondary | ICD-10-CM

## 2024-07-26 DIAGNOSIS — I6782 Cerebral ischemia: Secondary | ICD-10-CM | POA: Diagnosis not present

## 2024-07-26 DIAGNOSIS — Z723 Lack of physical exercise: Secondary | ICD-10-CM | POA: Diagnosis not present

## 2024-07-26 DIAGNOSIS — Z0389 Encounter for observation for other suspected diseases and conditions ruled out: Secondary | ICD-10-CM | POA: Diagnosis not present

## 2024-07-26 DIAGNOSIS — C50412 Malignant neoplasm of upper-outer quadrant of left female breast: Secondary | ICD-10-CM

## 2024-07-26 DIAGNOSIS — R519 Headache, unspecified: Secondary | ICD-10-CM | POA: Diagnosis present

## 2024-07-26 LAB — CBC WITH DIFFERENTIAL/PLATELET
Abs Immature Granulocytes: 0.04 K/uL (ref 0.00–0.07)
Basophils Absolute: 0 K/uL (ref 0.0–0.1)
Basophils Relative: 0 %
Eosinophils Absolute: 0 K/uL (ref 0.0–0.5)
Eosinophils Relative: 0 %
HCT: 32 % — ABNORMAL LOW (ref 36.0–46.0)
Hemoglobin: 10.1 g/dL — ABNORMAL LOW (ref 12.0–15.0)
Immature Granulocytes: 0 %
Lymphocytes Relative: 9 %
Lymphs Abs: 0.9 K/uL (ref 0.7–4.0)
MCH: 27.2 pg (ref 26.0–34.0)
MCHC: 31.6 g/dL (ref 30.0–36.0)
MCV: 86.3 fL (ref 80.0–100.0)
Monocytes Absolute: 1.2 K/uL — ABNORMAL HIGH (ref 0.1–1.0)
Monocytes Relative: 12 %
Neutro Abs: 8.2 K/uL — ABNORMAL HIGH (ref 1.7–7.7)
Neutrophils Relative %: 79 %
Platelets: 406 K/uL — ABNORMAL HIGH (ref 150–400)
RBC: 3.71 MIL/uL — ABNORMAL LOW (ref 3.87–5.11)
RDW: 14.8 % (ref 11.5–15.5)
WBC: 10.5 K/uL (ref 4.0–10.5)
nRBC: 0 % (ref 0.0–0.2)

## 2024-07-26 LAB — BASIC METABOLIC PANEL WITH GFR
Anion gap: 15 (ref 5–15)
BUN: 11 mg/dL (ref 8–23)
CO2: 21 mmol/L — ABNORMAL LOW (ref 22–32)
Calcium: 9.1 mg/dL (ref 8.9–10.3)
Chloride: 95 mmol/L — ABNORMAL LOW (ref 98–111)
Creatinine, Ser: 0.74 mg/dL (ref 0.44–1.00)
GFR, Estimated: >60 mL/min (ref >60)
Glucose, Bld: 109 mg/dL — ABNORMAL HIGH (ref 70–99)
Potassium: 4.2 mmol/L (ref 3.5–5.1)
Sodium: 131 mmol/L — ABNORMAL LOW (ref 135–145)

## 2024-07-26 LAB — PRO BRAIN NATRIURETIC PEPTIDE: Pro Brain Natriuretic Peptide: 1273 pg/mL — ABNORMAL HIGH (ref <300.0)

## 2024-07-26 LAB — D-DIMER, QUANTITATIVE: D-Dimer, Quant: 7.42 ug{FEU}/mL — ABNORMAL HIGH (ref 0.00–0.50)

## 2024-07-26 LAB — CK: Total CK: 15 U/L — ABNORMAL LOW (ref 38–234)

## 2024-07-26 MED ORDER — ACETAMINOPHEN 325 MG PO TABS: 650.0000 mg | ORAL_TABLET | Freq: Four times a day (QID) | ORAL | Status: DC | PRN

## 2024-07-26 MED ORDER — IOHEXOL 350 MG/ML SOLN
75.0000 mL | Freq: Once | INTRAVENOUS | Status: AC | PRN
  Administered 2024-07-26: 75 mL via INTRAVENOUS

## 2024-07-26 MED ORDER — HEPARIN (PORCINE) 25000 UT/250ML-% IV SOLN
INTRAVENOUS | Status: AC
  Administered 2024-07-26: 3000 [IU] via INTRAVENOUS
  Filled 2024-07-26: qty 250

## 2024-07-26 MED ORDER — ACETAMINOPHEN 650 MG RE SUPP: 650.0000 mg | Freq: Four times a day (QID) | RECTAL | Status: DC | PRN

## 2024-07-26 MED ORDER — HEPARIN BOLUS VIA INFUSION
3000.0000 [IU] | Freq: Once | INTRAVENOUS | Status: AC
Start: 2024-07-26 — End: 2024-07-26
  Filled 2024-07-26: qty 3000

## 2024-07-26 MED ORDER — HEPARIN (PORCINE) 25000 UT/250ML-% IV SOLN
1800.0000 [IU]/h | INTRAVENOUS | Status: AC
  Administered 2024-07-26: 900 [IU]/h via INTRAVENOUS
  Administered 2024-07-28: 1400 [IU]/h via INTRAVENOUS
  Administered 2024-07-29: 1600 [IU]/h via INTRAVENOUS
  Administered 2024-07-30 – 2024-07-31 (×2): 1800 [IU]/h via INTRAVENOUS
  Filled 2024-07-26 (×7): qty 250

## 2024-07-26 NOTE — H&P (Incomplete)
 History and Physical    Lindsey [name] FMW:[id number] DOB: 1940-11-30 DOA: 07/26/2024  Patient coming from: Home.  Chief Complaint: Left upper extremity swelling.  HPI: Lindsey [name] is a 83 y.o. female with history of breast cancer status post lumpectomy and radiation and tamoxifen  therapy and prior history of temporal arteritis status post prednisone  therapy presents to the ER with complaints of left upper extremity swelling.  Patient states she noticed some swelling in the left upper extremity which has been progressively worsening over the last 4 days.  Denies any trauma or any recent procedures of the left upper extremity.  Denies pain fever or chills.  Patient did have some shortness of breath.  Over the last few months patient states she also has been having some left-sided headaches.  Denies any visual symptoms.  ED Course: In the ER patient's D-dimer was elevated.  Underwent CT angiogram of the chest which shows DVT involving the left subclavian vein and left internal jugular vein.  CT scan also showed ascending aortic aneurysm measuring 6 cm.  ER physician discussed with Dr. Sheree on-call vascular surgeon who advised patient to be placed on heparin  infusion and transferred to [facility].  Also discussed with Dr. Kerrin cardiothoracic surgeon about ascending aortic aneurysm.  Patient admitted for further workup.  Labs show a hemoglobin of 10.1 sodium 131 creatinine 0.7.  Review of Systems: As per HPI, rest all negative.   Past Medical History:  Diagnosis Date   Breast cancer (HCC) 01/03/14   left, 12 o'clock, upper outer   Radiation 03/09/14-03/31/14   Left Breast    Temporal arteritis [facility])     Past Surgical History:  Procedure Laterality Date   ARTERY BIOPSY Right 12/27/2013   Procedure: BIOPSY TEMPORAL ARTERY;  Surgeon: Camellia CHRISTELLA Blush, MD;  Location: [facility] OR;  Service: General;  Laterality: Right;   BREAST SURGERY  01/03/14   breast biopsy   TUBAL LIGATION     many  years ago     reports that she has never smoked. She has never been exposed to tobacco smoke. She has never used smokeless tobacco. She reports that she does not drink alcohol and does not use drugs.  No Known Allergies  Family History  Problem Relation Age of Onset   Diabetes Paternal Uncle    Alzheimer's disease Mother     Prior to Admission medications   Medication Sig Start Date End Date Taking? Authorizing Provider  ascorbic acid (VITAMIN C) 500 MG tablet Take 500 mg by mouth 3 (three) times a week.   Yes [provider]  B Complex Vitamins (VITAMIN B COMPLEX) TABS Take 1 tablet by mouth 3 (three) times a week.   Yes [provider]  calcium carbonate (OSCAL) 1500 (600 Ca) MG TABS tablet Take 1,500 mg by mouth 3 (three) times a week.   Yes [provider]  cholecalciferol (VITAMIN D3) 25 MCG (1000 UNIT) tablet Take 1,000 Units by mouth 3 (three) times a week.   Yes [provider]  loperamide  (IMODIUM ) 2 MG capsule Take 1 capsule (2 mg total) by mouth 4 (four) times daily as needed for diarrhea or loose stools. Patient not taking: Reported on 07/26/2024 08/02/22   Ellouise Richerd POUR, DO    Physical Exam: Constitutional: Moderately built and nourished. Vitals:   07/26/24 2215 07/26/24 2230 07/26/24 2245 07/26/24 2300  BP: 123/81 (!) 143/86 132/68 124/80  Pulse: 98 98 99 95  Resp:      Temp:  TempSrc:      SpO2: 99% 98% 97% 100%   Eyes: Anicteric no pallor. ENMT: No discharge from the ears eyes nose or mouth. Neck: No mass felt.  No neck rigidity Respiratory: No rhonchi or crepitations. Cardiovascular: S1-S2 heard. Abdomen: Soft nontender bowel sound present. Musculoskeletal: Left upper extremity swelling extending up the whole arm able to move and has sensation.  Pulses full. Skin: erythema of the left upper extremity. Neurologic: Alert awake oriented to time place and person.  Moves all extremities. Psychiatric: Appears normal.   Normal affect.   Labs on Admission: I have personally reviewed following labs and imaging studies  CBC: Recent Labs  Lab 07/26/24 1657  WBC 10.5  NEUTROABS 8.2*  HGB 10.1*  HCT 32.0*  MCV 86.3  PLT 406*   Basic Metabolic Panel: Recent Labs  Lab 07/26/24 1657  NA 131*  K 4.2  CL 95*  CO2 21*  GLUCOSE 109*  BUN 11  CREATININE 0.74  CALCIUM 9.1   GFR: CrCl cannot be calculated (Unknown ideal weight.). Liver Function Tests: No results for input(s): AST, ALT, ALKPHOS, BILITOT, PROT, ALBUMIN in the last 168 hours. No results for input(s): LIPASE, AMYLASE in the last 168 hours. No results for input(s): AMMONIA in the last 168 hours. Coagulation Profile: No results for input(s): INR, PROTIME in the last 168 hours. Cardiac Enzymes: Recent Labs  Lab 07/26/24 1657  CKTOTAL 15*   BNP (last 3 results) Recent Labs    07/26/24 1657  PROBNP 1,273.0*   HbA1C: No results for input(s): HGBA1C in the last 72 hours. CBG: No results for input(s): GLUCAP in the last 168 hours. Lipid Profile: No results for input(s): CHOL, HDL, LDLCALC, TRIG, CHOLHDL, LDLDIRECT in the last 72 hours. Thyroid  Function Tests: No results for input(s): TSH, T4TOTAL, FREET4, T3FREE, THYROIDAB in the last 72 hours. Anemia Panel: No results for input(s): VITAMINB12, FOLATE, FERRITIN, TIBC, IRON, RETICCTPCT in the last 72 hours. Urine analysis:    Component Value Date/Time   COLORURINE YELLOW 07/21/2023 1430   APPEARANCEUR CLEAR 07/21/2023 1430   LABSPEC <=1.005 (A) 07/21/2023 1430   PHURINE 6.0 07/21/2023 1430   GLUCOSEU NEGATIVE 07/21/2023 1430   HGBUR NEGATIVE 07/21/2023 1430   BILIRUBINUR NEGATIVE 07/21/2023 1430   BILIRUBINUR neg 12/26/2013 1343   KETONESUR NEGATIVE 07/21/2023 1430   PROTEINUR NEGATIVE 12/26/2013 1551   PROTEINUR 30 12/26/2013 1343   UROBILINOGEN 0.2 07/21/2023 1430   NITRITE NEGATIVE 07/21/2023 1430    LEUKOCYTESUR SMALL (A) 07/21/2023 1430   Sepsis Labs: @LABRCNTIP (procalcitonin:4,lacticidven:4) )No results found for this or any previous visit (from the past 240 hours).   Radiological Exams on Admission: CT Angio Chest PE W and/or Wo Contrast Result Date: 07/26/2024 CLINICAL DATA:  Chest pain with left arm swelling, initial encounter EXAM: CT ANGIOGRAPHY CHEST WITH CONTRAST TECHNIQUE: Multidetector CT imaging of the chest was performed using the standard protocol during bolus administration of intravenous contrast. Multiplanar CT image reconstructions and MIPs were obtained to evaluate the vascular anatomy. RADIATION DOSE REDUCTION: This exam was performed according to the departmental dose-optimization program which includes automated exposure control, adjustment of the mA and/or kV according to patient size and/or use of iterative reconstruction technique. CONTRAST:  75mL OMNIPAQUE  IOHEXOL  350 MG/ML SOLN COMPARISON:  None Available. FINDINGS: Cardiovascular: Thoracic aorta and its branches are well visualize without evidence of dissection. Aneurysmal dilatation of the ascending aorta is noted to 6 cm. Normal tapering in the aortic arch is noted. The descending aorta demonstrates atherosclerotic calcification without  complicating factors. The pulmonary artery shows a normal branching pattern bilaterally. No filling defect to suggest pulmonary embolism is noted. In the left neck there is considerable inflammatory change surrounding the left internal jugular vein which appears enlarged in size. This raises suspicion for underlying deep venous thrombosis. Similar findings are seen in the left shoulder involving the subclavian vein. Mediastinum/Nodes: Thoracic inlet is otherwise within normal limits. No hilar or mediastinal adenopathy is noted. The esophagus as visualized is within normal limits. Lungs/Pleura: Lungs are well aerated bilaterally. No focal infiltrate or sizable parenchymal nodule is seen. No  effusions are noted. Mild scarring is noted within the right middle lobe. Upper Abdomen: No acute abnormality. Musculoskeletal: No acute bony abnormality is noted. Review of the MIP images confirms the above findings. IMPRESSION: Changes highly suggestive of deep venous thrombosis within the left internal jugular vein as well as the left subclavian vein. This would correspond with the patient's given clinical history of left arm swelling. Duplex ultrasound is recommended for further evaluation. No evidence of pulmonary emboli. Dilatation of the ascending aorta to 6 cm. Recommend semi-annual imaging followup by CTA or MRA and referral to cardiothoracic surgery if not already obtained. This recommendation follows 2010 ACCF/AHA/AATS/ACR/ASA/SCA/SCAI/SIR/STS/SVM Guidelines for the Diagnosis and Management of Patients With Thoracic Aortic Disease. Circulation. 2010; 121: [id number]. Aortic aneurysm NOS (ICD10-I71.9) Right middle lobe scarring. Electronically Signed   By: Oneil Devonshire M.D.   On: 07/26/2024 21:13   DG Chest Port 1 View Result Date: 07/26/2024 CLINICAL DATA:  shob EXAM: PORTABLE CHEST 1 VIEW COMPARISON:  Chest x-ray 12/26/2013 FINDINGS: The heart and mediastinal contours are grossly unremarkable in the setting of patient leaning forward. No focal consolidation. No pulmonary edema. No pleural effusion. No pneumothorax. No acute osseous abnormality. IMPRESSION: No acute cardiopulmonary disease. Slightly limited evaluation due to patient positioning. Consider repeat chest x-ray PA and lateral view for further evaluation. Electronically Signed   By: Morgane  Naveau M.D.   On: 07/26/2024 19:44    EKG: Independently reviewed.  Normal sinus rhythm.  Assessment/Plan Principal Problem:   DVT (deep venous thrombosis) (HCC) Active Problems:   Malignant neoplasm of upper-outer quadrant of left breast in female, estrogen receptor positive (HCC)   Ascending aortic aneurysm (HCC)    Extensive DVT  involving the left subclavian and left internal jugular vein and probably the left upper extremity -   ER physician discussed with Dr. Gari on-call vascular surgeon who advised starting on heparin  and transferring to [facility].  Will check Dopplers of the left upper extremity.  Will await further recommendation from vascular surgery. History of temporal arteritis used to be on prednisone .  Patient states over the last few months she has been having some headaches on the left side temporal area denies any visual symptoms.  Will check CRP and sed rate CT head. Ascending aortic aneurysm Dr. Kerrin was consulted ER physician.  Will follow recommendations with Dr. Kerrin.  Denies any chest pain. Hyponatremia closely monitor metabolic panel.  Check urine studies.  TSH.  And cortisol. Anemia normocytic normochromic.  Appears to be new.  Follow CBC.  Check anemia panel with next blood draw. Prior history of breast cancer status post lumpectomy and radiation and tamoxifen  therapy.  Since patient has extensive DVT of the subclavian and internal jugular vein and probably the left upper extremity will need close monitoring further workup and more than 2 midnight stay.   DVT prophylaxis: Heparin  infusion. Code Status: Full code. Family Communication: Discussed with patient.  Disposition Plan: Monitored bed. Consults called: Vascular surgery and cardiothoracic surgery. Admission status: Inpatient.

## 2024-07-26 NOTE — ED Notes (Signed)
Pt to ED via [facility] 

## 2024-07-26 NOTE — ED Provider Notes (Signed)
 [facility]    CSN: [id number] Arrival date & time: 07/26/24  1512      History   Chief Complaint Chief Complaint  Patient presents with   Arm Swelling   Shortness of Breath    HPI Lindsey [name] is a 83 y.o. female.    Shortness of Breath  Patient was sent here from primary care due to symptoms.  She noted swelling to the left arm about 3 days ago.  She states she just woke up and the left arm was swollen.  No known injury, no insect bite or other opening to the skin.  About the same time she also noted a new sob.  She noted she had to sit more often during the day to catch her breath.  She has noted a cough, with white sputum.   No other URI symptoms.  No fevers/chills.  No n/v.  No other swelling is noted.  She finished steroids last month for temporal arteritis, but otherwise no long term medications.  She had a lumpectomy in the past, but has not had a mastectomy.    {The patient has not been seen in Urgent Care in the last 3 years. :1}    Past Medical History:  Diagnosis Date   Breast cancer (HCC) 01/03/14   left, 12 o'clock, upper outer   Radiation 03/09/14-03/31/14   Left Breast    Temporal arteritis [facility])     Patient Active Problem List   Diagnosis Date Noted   Encounter for general adult medical examination with abnormal findings 07/21/2023   Bilateral hearing loss 05/15/2022   Bunion, right foot 05/15/2022   DOE (dyspnea on exertion) 10/31/2021   Essential hypertension 01/31/2016   Estrogen deficiency 01/31/2016   PMR (polymyalgia rheumatica) (HCC) 08/28/2015   Hyperglycemia 03/02/2014   Hyperlipidemia with target LDL less than 160 03/02/2014   Routine general medical examination at a health care facility 03/02/2014   Malignant neoplasm of upper-outer quadrant of left breast in female, estrogen receptor positive (HCC) 02/13/2014   Temporal arteritis (HCC) 12/26/2013    Past Surgical History:  Procedure Laterality Date   ARTERY  BIOPSY Right 12/27/2013   Procedure: BIOPSY TEMPORAL ARTERY;  Surgeon: Camellia CHRISTELLA Blush, MD;  Location: [facility] OR;  Service: General;  Laterality: Right;   BREAST SURGERY  01/03/14   breast biopsy   TUBAL LIGATION     many years ago    OB History   No obstetric history on file.      Home Medications    Prior to Admission medications   Medication Sig Start Date End Date Taking? Authorizing Provider  colestipol (COLESTID) 1 g tablet  02/03/24  Yes [provider]  acetaminophen  (TYLENOL ) 650 MG CR tablet Take 650 mg by mouth every 8 (eight) hours as needed for pain. Patient not taking: Reported on 09/16/2023    [provider]  B Complex Vitamins (B COMPLEX PO) Take by mouth. Patient not taking: Reported on 09/16/2023    [provider]  Calcium Carbonate-Vitamin D (CALTRATE 600+D PO) Take by mouth. Patient not taking: Reported on 09/16/2023    [provider]  cholecalciferol (VITAMIN D3) 25 MCG (1000 UNIT) tablet Take 1,000 Units by mouth daily. Patient not taking: Reported on 09/16/2023    [provider]  loperamide  (IMODIUM ) 2 MG capsule Take 1 capsule (2 mg total) by mouth 4 (four) times daily as needed for diarrhea or loose stools. 08/02/22   Kingsley, Victoria K, DO  Multiple Vitamins-Minerals (MULTIVITAMIN WITH MINERALS) tablet Take 1 tablet by mouth daily. Patient not taking: Reported on 09/16/2023    [provider]  psyllium (METAMUCIL) 0.52 g capsule Take 1 capsule (0.52 g total) by mouth daily. Patient not taking: Reported on 09/16/2023 08/02/22   Ellouise Richerd POUR, DO    Family History Family History  Problem Relation Age of Onset   Diabetes Paternal Uncle    Alzheimer's disease Mother     Social History Social History   Tobacco Use   Smoking status: Never    Passive exposure: Never   Smokeless tobacco: Never  Vaping Use   Vaping status: Never Used  Substance Use Topics   Alcohol use: No    Comment: rare occaisons  glass of wine   Drug use: No     Allergies   Patient has no known allergies.   Review of Systems Review of Systems  Constitutional:  Positive for fatigue.  HENT: Negative.    Respiratory:  Positive for shortness of breath.   Cardiovascular: Negative.   Gastrointestinal: Negative.   Genitourinary: Negative.   Psychiatric/Behavioral: Negative.       Physical Exam Triage Vital Signs ED Triage Vitals [07/26/24 1522]  Encounter Vitals Group     BP (!) 90/45     Girls Systolic BP Percentile      Girls Diastolic BP Percentile      Boys Systolic BP Percentile      Boys Diastolic BP Percentile      Pulse Rate 65     Resp (!) 22     Temp 97.9 F (36.6 C)     Temp Source Oral     SpO2 97 %     Weight 121 lb 14.6 oz (55.3 kg)     Height      Head Circumference      Peak Flow      Pain Score      Pain Loc      Pain Education      Exclude from Growth Chart    No data found.  Updated Vital Signs BP (!) 90/45 (BP Location: Right Arm)   Pulse 65   Temp 97.9 F (36.6 C) (Oral)   Resp (!) 22   Wt 55.3 kg   SpO2 97%   BMI 19.68 kg/m   Visual Acuity Right Eye Distance:   Left Eye Distance:   Bilateral Distance:    Right Eye Near:   Left Eye Near:    Bilateral Near:     Physical Exam Constitutional:      Appearance: She is well-developed and normal weight.     Comments: Appears sob speaking in normal sentences;  sitting in a wheelchair  Cardiovascular:     Rate and Rhythm: Normal rate and regular rhythm.  Pulmonary:     Effort: Pulmonary effort is normal.     Breath sounds: Normal breath sounds. No decreased breath sounds or wheezing.  Musculoskeletal:     Comments: The left hand and arm are swollen at least double the right arm;  there is bruising to the left lateral arm;  no other sores/lesions noted;  there is bruising to the upper left arm near the shoulder;  the arm does appear tender and warm  Neurological:     General: No focal deficit present.      Mental Status: She is alert.  Psychiatric:        Mood and Affect: Mood normal.  UC Treatments / Results  Labs (all labs ordered are listed, but only abnormal results are displayed) Labs Reviewed - No data to display  EKG   Radiology No results found.  Procedures Procedures (including critical care time)  Medications Ordered in UC Medications - No data to display  Initial Impression / Assessment and Plan / UC Course  I have reviewed the triage vital signs and the nursing notes.  Pertinent labs & imaging results that were available during my care of the patient were reviewed by me and considered in my medical decision making (see chart for details).   Final Clinical Impressions(s) / UC Diagnoses   Final diagnoses:  Shortness of breath  Left arm swelling     Discharge Instructions      EMS was called for transport of patient to the ER for further evaluation and treatment.     ED Prescriptions   None    PDMP not reviewed this encounter.

## 2024-07-26 NOTE — ED Triage Notes (Signed)
 Pt arriving via [facility] from [facility]gent Care. Pt has left arm swelling that began approx 3 days ago. Left arm now has 4+ edema. Pt denies pain. Upon EMS arrival they noted pts extremities were slightly cyanotic. Pt placed on 4L O2 via nasal cannula. Pt denies any labored breathing. Resp 20-24. Pt has had decreased appetite and decreased urine output as well. Denies taking any prescription medications. Hx of left breast cancer (2015).

## 2024-07-26 NOTE — ED Notes (Signed)
 Patient is being discharged from the Urgent Care and sent to the Emergency Department via ambulance . Per Dr. Darral, MD, patient is in need of higher level of care due to the need for additional testing and imaging. Patient is aware and verbalizes understanding of plan of care.  Vitals:   07/26/24 1522  BP: (!) 90/45  Pulse: 65  Resp: (!) 22  Temp: 97.9 F (36.6 C)  SpO2: 97%

## 2024-07-26 NOTE — Discharge Instructions (Signed)
 EMS was called for transport of patient to the ER for further evaluation and treatment.

## 2024-07-26 NOTE — ED Triage Notes (Signed)
 Pt presents c/o SOB and L arm swelling  x 3 days. Pt reports she woke up one morning with arm swollen. Not sure if it is a bug bite or not. Pt also states she feels SOB, especially with activity. Pt also reports productive cough. Pt denies fever.

## 2024-07-26 NOTE — ED Provider Notes (Signed)
 [place] [facility]ERGENC[facility] DEPARTMENT AT [facility] Provider Note   CSN: [id number] Arrival date & time: 07/26/24  1623     Patient presents with: Arm Swelling (left)   Lindsey [name] is a 83 y.o. female.   HPI     83 year old female history of breast cancer comes in with chief complaint of left upper extremity swelling for the last 3 or 4 days.  She is also having some shortness of breath now.  Patient denies any chest pain.  She has no history of previous swelling to the left upper extremity.  Review of system is negative for any nausea, vomiting, fevers, chills.  Patient has history of breast cancer on the left side, status post lumpectomy, radiation therapy and oral chemotherapy.  She is in remission.  Additionally, she has history of polymyalgia rheumatica. Prior to Admission medications   Medication Sig Start Date End Date Taking? Authorizing Provider  colestipol (COLESTID) 1 g tablet  02/03/24   [provider]  loperamide  (IMODIUM ) 2 MG capsule Take 1 capsule (2 mg total) by mouth 4 (four) times daily as needed for diarrhea or loose stools. 08/02/22   Kingsley, Victoria K, DO    Allergies: Patient has no known allergies.    Review of Systems  All other systems reviewed and are negative.   Updated Vital Signs BP 121/75   Pulse 99   Temp 98.6 F (37 C)   Resp 16   SpO2 98%   Physical Exam Vitals and nursing note reviewed.  Constitutional:      Appearance: She is well-developed.  HENT:     Head: Atraumatic.  Eyes:     Extraocular Movements: Extraocular movements intact.     Pupils: Pupils are equal, round, and reactive to light.  Cardiovascular:     Rate and Rhythm: Tachycardia present.     Comments: Distal pulses noted Pulmonary:     Effort: Pulmonary effort is normal.  Musculoskeletal:        General: Swelling present.     Cervical back: Normal range of motion.     Comments: Left upper extremity swelling with some warmth to touch, no skin  breakdown noted, compartments are soft  Skin:    General: Skin is warm and dry.  Neurological:     Mental Status: She is alert and oriented to person, place, and time.     (all labs ordered are listed, but only abnormal results are displayed) Labs Reviewed  BASIC METABOLIC PANEL WITH GFR - Abnormal; Notable for the following components:      Result Value   Sodium 131 (*)    Chloride 95 (*)    CO2 21 (*)    Glucose, Bld 109 (*)    All other components within normal limits  CBC WITH DIFFERENTIAL/PLATELET - Abnormal; Notable for the following components:   RBC 3.71 (*)    Hemoglobin 10.1 (*)    HCT 32.0 (*)    Platelets 406 (*)    Neutro Abs 8.2 (*)    Monocytes Absolute 1.2 (*)    All other components within normal limits  D-DIMER, QUANTITATIVE - Abnormal; Notable for the following components:   D-Dimer, Quant 7.42 (*)    All other components within normal limits  CK - Abnormal; Notable for the following components:   Total CK 15 (*)    All other components within normal limits  PRO BRAIN NATRIURETIC PEPTIDE - Abnormal; Notable for the following components:   Pro Brain Natriuretic  Peptide 1,273.0 (*)    All other components within normal limits  CBC    EKG: EKG Interpretation Date/Time:  Tuesday July 26 2024 16:42:06 EDT Ventricular Rate:  97 PR Interval:  124 QRS Duration:  86 QT Interval:  358 QTC Calculation: 455 R Axis:   75  Text Interpretation: Sinus rhythm Probable left atrial enlargement Borderline T abnormalities, anterior leads No acute changes No significant change since last tracing Confirmed by Charlyn Sora [phone]) on 07/26/2024 7:01:32 PM  Radiology: CT Angio Chest PE W and/or Wo Contrast Result Date: 07/26/2024 CLINICAL DATA:  Chest pain with left arm swelling, initial encounter EXAM: CT ANGIOGRAPHY CHEST WITH CONTRAST TECHNIQUE: Multidetector CT imaging of the chest was performed using the standard protocol during bolus administration of  intravenous contrast. Multiplanar CT image reconstructions and MIPs were obtained to evaluate the vascular anatomy. RADIATION DOSE REDUCTION: This exam was performed according to the departmental dose-optimization program which includes automated exposure control, adjustment of the mA and/or kV according to patient size and/or use of iterative reconstruction technique. CONTRAST:  75mL OMNIPAQUE  IOHEXOL  350 MG/ML SOLN COMPARISON:  None Available. FINDINGS: Cardiovascular: Thoracic aorta and its branches are well visualize without evidence of dissection. Aneurysmal dilatation of the ascending aorta is noted to 6 cm. Normal tapering in the aortic arch is noted. The descending aorta demonstrates atherosclerotic calcification without complicating factors. The pulmonary artery shows a normal branching pattern bilaterally. No filling defect to suggest pulmonary embolism is noted. In the left neck there is considerable inflammatory change surrounding the left internal jugular vein which appears enlarged in size. This raises suspicion for underlying deep venous thrombosis. Similar findings are seen in the left shoulder involving the subclavian vein. Mediastinum/Nodes: Thoracic inlet is otherwise within normal limits. No hilar or mediastinal adenopathy is noted. The esophagus as visualized is within normal limits. Lungs/Pleura: Lungs are well aerated bilaterally. No focal infiltrate or sizable parenchymal nodule is seen. No effusions are noted. Mild scarring is noted within the right middle lobe. Upper Abdomen: No acute abnormality. Musculoskeletal: No acute bony abnormality is noted. Review of the MIP images confirms the above findings. IMPRESSION: Changes highly suggestive of deep venous thrombosis within the left internal jugular vein as well as the left subclavian vein. This would correspond with the patient's given clinical history of left arm swelling. Duplex ultrasound is recommended for further evaluation. No evidence  of pulmonary emboli. Dilatation of the ascending aorta to 6 cm. Recommend semi-annual imaging followup by CTA or MRA and referral to cardiothoracic surgery if not already obtained. This recommendation follows 2010 ACCF/AHA/AATS/ACR/ASA/SCA/SCAI/SIR/STS/SVM Guidelines for the Diagnosis and Management of Patients With Thoracic Aortic Disease. Circulation. 2010; 121: [id number]. Aortic aneurysm NOS (ICD10-I71.9) Right middle lobe scarring. Electronically Signed   By: Oneil Devonshire M.D.   On: 07/26/2024 21:13   DG Chest Port 1 View Result Date: 07/26/2024 CLINICAL DATA:  shob EXAM: PORTABLE CHEST 1 VIEW COMPARISON:  Chest x-ray 12/26/2013 FINDINGS: The heart and mediastinal contours are grossly unremarkable in the setting of patient leaning forward. No focal consolidation. No pulmonary edema. No pleural effusion. No pneumothorax. No acute osseous abnormality. IMPRESSION: No acute cardiopulmonary disease. Slightly limited evaluation due to patient positioning. Consider repeat chest x-ray PA and lateral view for further evaluation. Electronically Signed   By: Morgane  Naveau M.D.   On: 07/26/2024 19:44     .Critical Care  Performed by: Charlyn Sora, MD Authorized by: Charlyn Sora, MD   Critical care provider statement:    Critical care  time (minutes):  62   Critical care was necessary to treat or prevent imminent or life-threatening deterioration of the following conditions:  Circulatory failure   Critical care was time spent personally by me on the following activities:  Development of treatment plan with patient or surrogate, discussions with consultants, evaluation of patient's response to treatment, examination of patient, ordering and review of laboratory studies, ordering and review of radiographic studies, ordering and performing treatments and interventions, pulse oximetry, re-evaluation of patient's condition, review of old charts and obtaining history from patient or surrogate     Medications Ordered in the ED  heparin  bolus via infusion 3,000 Units (has no administration in time range)  heparin  ADULT infusion 100 units/mL (25000 units/250mL) (has no administration in time range)  iohexol  (OMNIPAQUE ) 350 MG/ML injection 75 mL (75 mLs Intravenous Contrast Given 07/26/24 2037)                                    Medical Decision Making Amount and/or Complexity of Data Reviewed Labs: ordered. Radiology: ordered.  Risk Prescription drug management. Decision regarding hospitalization.   83 year old female comes in with chief complaint of left upper extremity swelling.  She also now has some shortness of breath.  She has remote history of breast cancer, polymyalgia rheumatica.  Differential diagnosis for this patient includes acute DVT, acute pulmonary embolism, compartment syndrome, myositis, soft tissue infection, cellulitis, vena cava syndrome  Because she is complaining of shortness of breath, we did get D-dimer, but patient needs upper extremity DVT.  Unfortunately, ultrasound is not available at this hour at [facility] long. Chest x-ray ordered to make sure there is no large mass.  Reassessment: Patient's D-dimer was elevated, therefore we completed CT PE. X-ray of the chest was independently interpreted by me.  No evidence of pneumonia, lung mass.  CT PE shows concerns for clot in the left IJ, subclavian vein. ?   Patient also has incidental finding of 6 cm thoracic artery aneurysm.  For the thoracic artery aneurysm I consulted Dr. Charlott.  For now, they would want us  to focus on the large clot.  If patient is admitted to [facility], they will round on her otherwise patient needs to follow-up with them in the clinic.  For the extensive DVT, have consulted vascular surgery. Spoke with Dr. Sheree. Discussed the finding.  He wants heparin  for now and patient admitted to [facility], with ultrasound upper extremity DVT.  Patient made aware of the finding of  both the DVT and also the aneurysm.  Final diagnoses:  Subclavian artery thrombosis (HCC)  Acute deep vein thrombosis (DVT) of axillary vein of left upper extremity (HCC)  Aneurysm [facility])    ED Discharge Orders     None          Charlyn Sora, MD 07/26/24 2245

## 2024-07-26 NOTE — Progress Notes (Signed)
 PHARMACY - ANTICOAGULATION CONSULT NOTE  Pharmacy Consult for heparin  Indication: VTE treatment  No Known Allergies  Patient Measurements:    Vital Signs: Temp: 98.6 F (37 C) (09/16 2030) Temp Source: Oral (09/16 1640) BP: 121/75 (09/16 2200) Pulse Rate: 99 (09/16 2200)  Labs: Recent Labs    07/26/24 1657  HGB 10.1*  HCT 32.0*  PLT 406*  CREATININE 0.74  CKTOTAL 15*    CrCl cannot be calculated (Unknown ideal weight.).   Medical History: Past Medical History:  Diagnosis Date   Breast cancer (HCC) 01/03/14   left, 12 o'clock, upper outer   Radiation 03/09/14-03/31/14   Left Breast    Temporal arteritis [facility])      Assessment: 83 year old female history of breast cancer comes in with chief complaint of left upper extremity swelling for the last 3 or 4 days.  She is also having some shortness of breath now.  Pharmacy consulted to dose heparin  for VTE treatment, no prior AC noted  Hgb 10.1, plts 406, DDimer 7.42, Scr 0.74   Goal of Therapy:  Heparin  level 0.3-0.7 units/ml Monitor platelets by anticoagulation protocol: Yes   Plan:  Heparin  bolus 3000 units x 1 Start heparin  drip at 900 units/hr Heparin  level in 8 hours Daily CBC  Leeroy Mace RPh 07/26/2024, 10:24 PM

## 2024-07-27 ENCOUNTER — Inpatient Hospital Stay (HOSPITAL_COMMUNITY)

## 2024-07-27 ENCOUNTER — Other Ambulatory Visit (HOSPITAL_COMMUNITY)

## 2024-07-27 ENCOUNTER — Encounter (HOSPITAL_COMMUNITY): Payer: Self-pay | Admitting: Internal Medicine

## 2024-07-27 ENCOUNTER — Other Ambulatory Visit: Payer: Self-pay

## 2024-07-27 DIAGNOSIS — I7122 Aneurysm of the aortic arch, without rupture: Secondary | ICD-10-CM

## 2024-07-27 DIAGNOSIS — R609 Edema, unspecified: Secondary | ICD-10-CM | POA: Diagnosis not present

## 2024-07-27 DIAGNOSIS — D649 Anemia, unspecified: Secondary | ICD-10-CM | POA: Insufficient documentation

## 2024-07-27 DIAGNOSIS — I7121 Aneurysm of the ascending aorta, without rupture: Secondary | ICD-10-CM

## 2024-07-27 DIAGNOSIS — E871 Hypo-osmolality and hyponatremia: Secondary | ICD-10-CM | POA: Insufficient documentation

## 2024-07-27 DIAGNOSIS — I82C12 Acute embolism and thrombosis of left internal jugular vein: Secondary | ICD-10-CM

## 2024-07-27 DIAGNOSIS — I82B12 Acute embolism and thrombosis of left subclavian vein: Secondary | ICD-10-CM

## 2024-07-27 LAB — COMPREHENSIVE METABOLIC PANEL WITH GFR
ALT: 9 U/L (ref 0–44)
AST: 24 U/L (ref 15–41)
Albumin: 3.2 g/dL — ABNORMAL LOW (ref 3.5–5.0)
Alkaline Phosphatase: 110 U/L (ref 38–126)
Anion gap: 15 (ref 5–15)
BUN: 8 mg/dL (ref 8–23)
CO2: 21 mmol/L — ABNORMAL LOW (ref 22–32)
Calcium: 9.1 mg/dL (ref 8.9–10.3)
Chloride: 97 mmol/L — ABNORMAL LOW (ref 98–111)
Creatinine, Ser: 0.59 mg/dL (ref 0.44–1.00)
GFR, Estimated: >60 mL/min (ref >60)
Glucose, Bld: 100 mg/dL — ABNORMAL HIGH (ref 70–99)
Potassium: 3.7 mmol/L (ref 3.5–5.1)
Sodium: 134 mmol/L — ABNORMAL LOW (ref 135–145)
Total Bilirubin: 0.8 mg/dL (ref 0.0–1.2)
Total Protein: 5.7 g/dL — ABNORMAL LOW (ref 6.5–8.1)

## 2024-07-27 LAB — CBC
HCT: 31.8 % — ABNORMAL LOW (ref 36.0–46.0)
Hemoglobin: 10 g/dL — ABNORMAL LOW (ref 12.0–15.0)
MCH: 26.9 pg (ref 26.0–34.0)
MCHC: 31.4 g/dL (ref 30.0–36.0)
MCV: 85.5 fL (ref 80.0–100.0)
Platelets: 415 K/uL — ABNORMAL HIGH (ref 150–400)
RBC: 3.72 MIL/uL — ABNORMAL LOW (ref 3.87–5.11)
RDW: 14.8 % (ref 11.5–15.5)
WBC: 9 K/uL (ref 4.0–10.5)
nRBC: 0 % (ref 0.0–0.2)

## 2024-07-27 LAB — C-REACTIVE PROTEIN: CRP: 19.6 mg/dL — ABNORMAL HIGH (ref <1.0)

## 2024-07-27 LAB — CORTISOL: Cortisol, Plasma: 17.9 ug/dL

## 2024-07-27 LAB — IRON AND TIBC
Iron: 18 ug/dL — ABNORMAL LOW (ref 28–170)
Saturation Ratios: 8 % — ABNORMAL LOW (ref 10.4–31.8)
TIBC: 214 ug/dL — ABNORMAL LOW (ref 250–450)
UIBC: 196 ug/dL

## 2024-07-27 LAB — HEPARIN LEVEL (UNFRACTIONATED)
Heparin Unfractionated: <0.1 [IU]/mL — ABNORMAL LOW (ref 0.30–0.70)
Heparin Unfractionated: <0.1 [IU]/mL — ABNORMAL LOW (ref 0.30–0.70)

## 2024-07-27 LAB — RETICULOCYTES
Immature Retic Fract: 16.6 % — ABNORMAL HIGH (ref 2.3–15.9)
RBC.: 3.57 MIL/uL — ABNORMAL LOW (ref 3.87–5.11)
Retic Count, Absolute: 45.7 K/uL (ref 19.0–186.0)
Retic Ct Pct: 1.3 % (ref 0.4–3.1)

## 2024-07-27 LAB — SEDIMENTATION RATE: Sed Rate: 62 mm/h — ABNORMAL HIGH (ref 0–22)

## 2024-07-27 LAB — VITAMIN B12: Vitamin B-12: >4000 pg/mL — ABNORMAL HIGH (ref 180–914)

## 2024-07-27 LAB — TSH: TSH: 2.76 u[IU]/mL (ref 0.350–4.500)

## 2024-07-27 LAB — FERRITIN: Ferritin: 364 ng/mL — ABNORMAL HIGH (ref 11–307)

## 2024-07-27 LAB — FOLATE: Folate: >20 ng/mL (ref >5.9)

## 2024-07-27 MED ORDER — HEPARIN BOLUS VIA INFUSION
2000.0000 [IU] | Freq: Once | INTRAVENOUS | Status: AC
Start: 2024-07-27 — End: 2024-07-27
  Administered 2024-07-27: 2000 [IU] via INTRAVENOUS
  Filled 2024-07-27: qty 2000

## 2024-07-27 NOTE — Plan of Care (Signed)

## 2024-07-27 NOTE — Progress Notes (Signed)
 PROGRESS NOTE    AUTRY DROEGE  FMW:[id number] DOB: [birth year] DOA: 07/26/2024 PCP: Joshua Debby CROME, MD    Brief Narrative:  83 year old with history of breast cancer status postlumpectomy on the left side, previous radiation with no remaining cancer, history of temporal arteritis almost treated for 10 years with prednisone  presented with about 4 days of progressive swelling of the left arm and discomfort.  She was diagnosed with some headache in July of this year and treated with prednisone  for 1 month.  At the emergency room, patient was hemodynamically stable.  D-dimer was elevated.  CT angiogram of the chest shows extensive DVT left subclavian, left internal jugular vein.  It also showed 6 cm aortic aneurysm without complications. Upper extremity DVT study shows extensive DVTs of all veins involving left arm. Admitted with CT surgery and vascular surgery consultations.  Subjective: Seen and examined. Tired of all beeping and every 15 minutes BP cuff squeezing in her arms . Pain is mild . Also complaints of episodic left sided headache that comes in flash 1-2 times daily for long time. She had seen ophthalmologist for this and retina specialist that says she had a small clot in left retina , she was on prednisone  for 30 days without taper in month of July.  Appropriately anxious.  Assessment & Plan:   Extensive DVTs, both proximal and distal veins of left upper extremity: Primary cause unknown. Pressure from aortic aneurysm? Last time Tamoxifen  taken was 2020. Prednisone  taken was 3 months ago.  Patient had breast cancer and left-sided lumpectomy, CT angiogram without any evidence of recurrence of cancer. Currently on heparin  infusion.  Will continue.  Vascular surgery to see patient when she goes to [facility].  Ascending aortic aneurysm, patient has 6 cm ascending aortic aneurysm?  Pressure symptoms.  Currently without complications.  Followed by cardiothoracic surgery.  Not sure  patient is surgical candidate.  Headache: Currently she does not have any clinical picture consistent with temporal arteritis.  Symptomatic management.      DVT prophylaxis: Heparin  infusion.   Code Status: Full code. Family Communication: Daughter on the phone. Disposition Plan: Status is: Inpatient Remains inpatient appropriate because: Significant symptoms, IV heparin      Consultants:  Cardiothoracic surgery Vascular surgery  Procedures:  None  Antimicrobials:  None     Objective: Vitals:   07/27/24 0415 07/27/24 0545 07/27/24 0615 07/27/24 0918  BP: (!) 128/93 128/89 (!) 129/94 126/81  Pulse: (!) 114 (!) 104 (!) 103 100  Resp: 14 11 16 16   Temp: 98.7 F (37.1 C)   98.5 F (36.9 C)  TempSrc:    Oral  SpO2: 96% 98% 94% 100%   No intake or output data in the 24 hours ending 07/27/24 1310 There were no vitals filed for this visit.  Examination:  General exam: Appears calm and comfortable.  Appropriately anxious. Respiratory system: Clear to auscultation. Respiratory effort normal. Cardiovascular system: S1 & S2 heard, RRR. No JVD, murmurs, rubs, gallops or clicks.  Gastrointestinal system: Abdomen is nondistended, soft and nontender. No organomegaly or masses felt. Normal bowel sounds heard. Central nervous system: Alert and oriented. No focal neurological deficits. Extremities: Symmetric 5 x 5 power. Diffuse swelling and edema of the left arm and forearm with redness and erythema.  Negative for fluctuation.  Radial pulses palpable.    Data Reviewed: I have personally reviewed following labs and imaging studies  CBC: Recent Labs  Lab 07/26/24 1657 07/27/24 0233  WBC 10.5 9.0  NEUTROABS  8.2*  --   HGB 10.1* 10.0*  HCT 32.0* 31.8*  MCV 86.3 85.5  PLT 406* 415*   Basic Metabolic Panel: Recent Labs  Lab 07/26/24 1657 07/27/24 0233  NA 131* 134*  K 4.2 3.7  CL 95* 97*  CO2 21* 21*  GLUCOSE 109* 100*  BUN 11 8  CREATININE 0.74 0.59  CALCIUM  9.1 9.1   GFR: CrCl cannot be calculated (Unknown ideal weight.). Liver Function Tests: Recent Labs  Lab 07/27/24 0233  AST 24  ALT 9  ALKPHOS 110  BILITOT 0.8  PROT 5.7*  ALBUMIN 3.2*   No results for input(s): LIPASE, AMYLASE in the last 168 hours. No results for input(s): AMMONIA in the last 168 hours. Coagulation Profile: No results for input(s): INR, PROTIME in the last 168 hours. Cardiac Enzymes: Recent Labs  Lab 07/26/24 1657  CKTOTAL 15*   BNP (last 3 results) Recent Labs    07/26/24 1657  PROBNP 1,273.0*   HbA1C: No results for input(s): HGBA1C in the last 72 hours. CBG: No results for input(s): GLUCAP in the last 168 hours. Lipid Profile: No results for input(s): CHOL, HDL, LDLCALC, TRIG, CHOLHDL, LDLDIRECT in the last 72 hours. Thyroid  Function Tests: Recent Labs    07/27/24 0145  TSH 2.760   Anemia Panel: Recent Labs    07/27/24 0145 07/27/24 0230 07/27/24 0233  VITAMINB12  --  >4,000*  --   FOLATE  --   --  >20.0  FERRITIN  --  364*  --   TIBC  --  214*  --   IRON  --  18*  --   RETICCTPCT 1.3  --   --    Sepsis Labs: No results for input(s): PROCALCITON, LATICACIDVEN in the last 168 hours.  No results found for this or any previous visit (from the past 240 hours).       Radiology Studies: VAS US  UPPER EXTREMITY VENOUS DUPLEX Result Date: 07/27/2024 UPPER VENOUS STUDY  Patient Name:  Lindsey [name]  Date of Exam:   07/27/2024 Medical Rec #: [id number]        Accession #:    [id number] Date of Birth: [birth year]       Patient Gender: F Patient Age:   37 years Exam Location:  [facility] Procedure:      VAS US  UPPER EXTREMITY VENOUS DUPLEX Referring Phys: REDIA CLEAVER --------------------------------------------------------------------------------  Indications: Edema Risk Factors: None identified. Comparison       07/27/2024 - CT Angio Chest PE W and/or Wo Contrast Study:                  Changes  highly suggestive of deep venous thrombosis within the                  left                  internal jugular vein as well as the left subclavian vein.                  This                  would correspond with the patient's given clinical history of                  left                  arm swelling. Duplex ultrasound is recommended for further  evaluation. Performing Technologist: Cordella Collet RVT  Examination Guidelines: A complete evaluation includes B-mode imaging, spectral Doppler, color Doppler, and power Doppler as needed of all accessible portions of each vessel. Bilateral testing is considered an integral part of a complete examination. Limited examinations for reoccurring indications may be performed as noted.  Right Findings: +----------+------------+---------+-----------+----------+-------+ RIGHT     CompressiblePhasicitySpontaneousPropertiesSummary +----------+------------+---------+-----------+----------+-------+ Subclavian               Yes       Yes                      +----------+------------+---------+-----------+----------+-------+  Left Findings: +----------+------------+---------+-----------+----------+-------+ LEFT      CompressiblePhasicitySpontaneousPropertiesSummary +----------+------------+---------+-----------+----------+-------+ IJV           None       No        No                Acute  +----------+------------+---------+-----------+----------+-------+ Subclavian               No        No                Acute  +----------+------------+---------+-----------+----------+-------+ Axillary      None       No        No                Acute  +----------+------------+---------+-----------+----------+-------+ Brachial      None       No        No                Acute  +----------+------------+---------+-----------+----------+-------+ Radial        Full                                           +----------+------------+---------+-----------+----------+-------+ Ulnar         Full                                          +----------+------------+---------+-----------+----------+-------+ Cephalic    Partial                                  Acute  +----------+------------+---------+-----------+----------+-------+ Basilic       Full                                          +----------+------------+---------+-----------+----------+-------+  Summary:  Right: No evidence of thrombosis in the subclavian.  Left: Findings consistent with acute deep vein thrombosis involving the left internal jugular vein, left subclavian vein, left axillary vein and left brachial veins. Findings consistent with acute superficial vein thrombosis involving the left cephalic vein.  *See table(s) above for measurements and observations.    Preliminary    CT HEAD WO CONTRAST ( ) Result Date: 07/27/2024 CLINICAL DATA:  Headache, new onset (Age >= 51y) EXAM: CT HEAD WITHOUT CONTRAST TECHNIQUE: Contiguous axial images were obtained from the base of the skull through the vertex without intravenous contrast. RADIATION DOSE REDUCTION: This exam was performed according to the departmental dose-optimization program which includes  automated exposure control, adjustment of the mA and/or kV according to patient size and/or use of iterative reconstruction technique. COMPARISON:  None Available. FINDINGS: Brain: Diffuse brain atrophy pattern and chronic white matter microvascular ischemic changes throughout both cerebral hemispheres. No acute intracranial hemorrhage, mass lesion, infarction, midline shift, herniation, hydrocephalus, or extra-axial fluid collection. No focal mass effect or edema. Cisterns are patent. Cerebellar atrophy as well. Vascular: No hyperdense vessel or unexpected calcification. Skull: Normal. Negative for fracture or focal lesion. Sinuses/Orbits: No acute finding. Other: None. IMPRESSION: 1. Brain  atrophy and chronic white matter microvascular ischemic changes. 2. No acute intracranial abnormality by noncontrast CT. Electronically Signed   By: CHRISTELLA.  Shick M.D.   On: 07/27/2024 09:59   CT Angio Chest PE W and/or Wo Contrast Result Date: 07/26/2024 CLINICAL DATA:  Chest pain with left arm swelling, initial encounter EXAM: CT ANGIOGRAPHY CHEST WITH CONTRAST TECHNIQUE: Multidetector CT imaging of the chest was performed using the standard protocol during bolus administration of intravenous contrast. Multiplanar CT image reconstructions and MIPs were obtained to evaluate the vascular anatomy. RADIATION DOSE REDUCTION: This exam was performed according to the departmental dose-optimization program which includes automated exposure control, adjustment of the mA and/or kV according to patient size and/or use of iterative reconstruction technique. CONTRAST:  75mL OMNIPAQUE  IOHEXOL  350 MG/ML SOLN COMPARISON:  None Available. FINDINGS: Cardiovascular: Thoracic aorta and its branches are well visualize without evidence of dissection. Aneurysmal dilatation of the ascending aorta is noted to 6 cm. Normal tapering in the aortic arch is noted. The descending aorta demonstrates atherosclerotic calcification without complicating factors. The pulmonary artery shows a normal branching pattern bilaterally. No filling defect to suggest pulmonary embolism is noted. In the left neck there is considerable inflammatory change surrounding the left internal jugular vein which appears enlarged in size. This raises suspicion for underlying deep venous thrombosis. Similar findings are seen in the left shoulder involving the subclavian vein. Mediastinum/Nodes: Thoracic inlet is otherwise within normal limits. No hilar or mediastinal adenopathy is noted. The esophagus as visualized is within normal limits. Lungs/Pleura: Lungs are well aerated bilaterally. No focal infiltrate or sizable parenchymal nodule is seen. No effusions are noted.  Mild scarring is noted within the right middle lobe. Upper Abdomen: No acute abnormality. Musculoskeletal: No acute bony abnormality is noted. Review of the MIP images confirms the above findings. IMPRESSION: Changes highly suggestive of deep venous thrombosis within the left internal jugular vein as well as the left subclavian vein. This would correspond with the patient's given clinical history of left arm swelling. Duplex ultrasound is recommended for further evaluation. No evidence of pulmonary emboli. Dilatation of the ascending aorta to 6 cm. Recommend semi-annual imaging followup by CTA or MRA and referral to cardiothoracic surgery if not already obtained. This recommendation follows 2010 ACCF/AHA/AATS/ACR/ASA/SCA/SCAI/SIR/STS/SVM Guidelines for the Diagnosis and Management of Patients With Thoracic Aortic Disease. Circulation. 2010; 121: [id number]. Aortic aneurysm NOS (ICD10-I71.9) Right middle lobe scarring. Electronically Signed   By: Oneil Devonshire M.D.   On: 07/26/2024 21:13   DG Chest Port 1 View Result Date: 07/26/2024 CLINICAL DATA:  shob EXAM: PORTABLE CHEST 1 VIEW COMPARISON:  Chest x-ray 12/26/2013 FINDINGS: The heart and mediastinal contours are grossly unremarkable in the setting of patient leaning forward. No focal consolidation. No pulmonary edema. No pleural effusion. No pneumothorax. No acute osseous abnormality. IMPRESSION: No acute cardiopulmonary disease. Slightly limited evaluation due to patient positioning. Consider repeat chest x-ray PA and lateral view for further evaluation. Electronically Signed  By: Morgane  Naveau M.D.   On: 07/26/2024 19:44        Scheduled Meds: Continuous Infusions:  heparin  900 Units/hr (07/27/24 0935)     LOS: 1 day    Time spent: 55 minutes    Renato Applebaum, MD [facility]

## 2024-07-27 NOTE — Consult Note (Signed)
 Reason for Consult:ascending aneurysm Referring Physician: THERESSA [facility]  BRISSA [name] is an 83 y.o. female.  HPI: 83 yo woman in generally good health presented to ED with c/o left arm swelling for several days.  Denies any chest pain, pressure or tightness.  For about the past week or two ha felt tired and run down.  Found to have acute DVT of left subclavian and left internal jugular.  CT also showed a 6 cm ascending aneurysm.  Currently c/o arm swelling and tired- wasn't able to sleep at all.  Frustrated she doesn't have a room yet.  Past Medical History:  Diagnosis Date   Breast cancer (HCC) 01/03/14   left, 12 o'clock, upper outer   Radiation 03/09/14-03/31/14   Left Breast    Temporal arteritis [facility])     Past Surgical History:  Procedure Laterality Date   ARTERY BIOPSY Right 12/27/2013   Procedure: BIOPSY TEMPORAL ARTERY;  Surgeon: Camellia CHRISTELLA Blush, MD;  Location: [facility] OR;  Service: General;  Laterality: Right;   BREAST SURGERY  01/03/14   breast biopsy   TUBAL LIGATION     many years ago    Family History  Problem Relation Age of Onset   Diabetes Paternal Uncle    Alzheimer's disease Mother     Social History:  reports that she has never smoked. She has never been exposed to tobacco smoke. She has never used smokeless tobacco. She reports that she does not drink alcohol and does not use drugs.  Allergies: No Known Allergies  Medications: Prior to Admission: Current Outpatient Medications  Medication Instructions   ascorbic acid (VITAMIN C) 500 mg, 3 times weekly   B Complex Vitamins (VITAMIN B COMPLEX) TABS 1 tablet, 3 times weekly   calcium carbonate (OSCAL) 1,500 mg, 3 times weekly   cholecalciferol (VITAMIN D3) 1,000 Units, 3 times weekly   loperamide  (IMODIUM ) 2 mg, Oral, 4 times daily PRN     Results for orders placed or performed during the hospital encounter of 07/26/24 (from the past 48 hours)  Basic metabolic panel     Status: Abnormal   Collection Time:  07/26/24  4:57 PM  Result Value Ref Range   Sodium 131 (L) 135 - 145 mmol/L   Potassium 4.2 3.5 - 5.1 mmol/L   Chloride 95 (L) 98 - 111 mmol/L   CO2 21 (L) 22 - 32 mmol/L   Glucose, Bld 109 (H) 70 - 99 mg/dL    Comment: Glucose reference range applies only to samples taken after fasting for at least 8 hours.   BUN 11 8 - 23 mg/dL   Creatinine, Ser 9.25 0.44 - 1.00 mg/dL   Calcium 9.1 8.9 - 89.6 mg/dL   GFR, Estimated >39 >39 mL/min    Comment: (NOTE) Calculated using the CKD-EPI Creatinine Equation (2021)    Anion gap 15 5 - 15    Comment: Performed at [facility], [address] [place], [place] [address]  CBC with Differential     Status: Abnormal   Collection Time: 07/26/24  4:57 PM  Result Value Ref Range   WBC 10.5 4.0 - 10.5 K/uL   RBC 3.71 (L) 3.87 - 5.11 MIL/uL   Hemoglobin 10.1 (L) 12.0 - 15.0 g/dL   HCT 67.9 (L) 63.9 - 53.9 %   MCV 86.3 80.0 - 100.0 fL   MCH 27.2 26.0 - 34.0 pg   MCHC 31.6 30.0 - 36.0 g/dL   RDW 85.1 88.4 - 84.4 %  Platelets 406 (H) 150 - 400 K/uL   nRBC 0.0 0.0 - 0.2 %   Neutrophils Relative % 79 %   Neutro Abs 8.2 (H) 1.7 - 7.7 K/uL   Lymphocytes Relative 9 %   Lymphs Abs 0.9 0.7 - 4.0 K/uL   Monocytes Relative 12 %   Monocytes Absolute 1.2 (H) 0.1 - 1.0 K/uL   Eosinophils Relative 0 %   Eosinophils Absolute 0.0 0.0 - 0.5 K/uL   Basophils Relative 0 %   Basophils Absolute 0.0 0.0 - 0.1 K/uL   Immature Granulocytes 0 %   Abs Immature Granulocytes 0.04 0.00 - 0.07 K/uL    Comment: Performed at [facility], [address] [place], [place] [address]  D-dimer, quantitative     Status: Abnormal   Collection Time: 07/26/24  4:57 PM  Result Value Ref Range   D-Dimer, Quant 7.42 (H) 0.00 - 0.50 ug/mL-FEU    Comment: (NOTE) At the manufacturer cut-off value of 0.5 g/mL FEU, this assay has a negative predictive value of 95-100%.This assay is intended for use in conjunction with a clinical pretest  probability (PTP) assessment model to exclude pulmonary embolism (PE) and deep venous thrombosis (DVT) in outpatients suspected of PE or DVT. Results should be correlated with clinical presentation. Performed at [facility], [address] [place], [place] [address]   CK     Status: Abnormal   Collection Time: 07/26/24  4:57 PM  Result Value Ref Range   Total CK 15 (L) 38 - 234 U/L    Comment: Performed at [facility], [address], [place], [place] [address]  Pro Brain natriuretic peptide     Status: Abnormal   Collection Time: 07/26/24  4:57 PM  Result Value Ref Range   Pro Brain Natriuretic Peptide 1,273.0 (H) <300.0 pg/mL    Comment: (NOTE) Age Group        Cut-Points    Interpretation  < 50 years     450 pg/mL       NT-proBNP > 450 pg/mL indicates                                ADHF is likely              50 to 75 years  900 pg/mL      NT-proBNP > 900 pg/mL indicates          ADHF is likely  > 75 years      1800 pg/mL     NT-proBNP > 1800 pg/mL indicates          ADHF is likely                           All ages    Results between       Indeterminate. Further clinical             300 and the cut-   information is needed to determine            point for age group   if ADHF is present.  Elecsys proBNP II/ Elecsys proBNP II STAT           Cut-Point                       Interpretation  300 pg/mL                    NT-proBNP <300pg/mL indicates                             ADHF is not likely  Performed at [facility], [address], [place], [place] [address]   TSH     Status: None   Collection Time: 07/27/24  1:45 AM  Result Value Ref Range   TSH 2.760 0.350 - 4.500 uIU/mL    Comment: Performed at [facility], [address] [place], [place] [address]  Reticulocytes     Status: Abnormal   Collection Time: 07/27/24  1:45 AM   Result Value Ref Range   Retic Ct Pct 1.3 0.4 - 3.1 %   RBC. 3.57 (L) 3.87 - 5.11 MIL/uL   Retic Count, Absolute 45.7 19.0 - 186.0 K/uL   Immature Retic Fract 16.6 (H) 2.3 - 15.9 %    Comment: Performed at [facility], [address] [place], [place] [address]  Sedimentation rate     Status: Abnormal   Collection Time: 07/27/24  2:30 AM  Result Value Ref Range   Sed Rate 62 (H) 0 - 22 mm/hr    Comment: Performed at [facility], [address] [place], [place] [address]  C-reactive protein     Status: Abnormal   Collection Time: 07/27/24  2:30 AM  Result Value Ref Range   CRP 19.6 (H) <1.0 mg/dL    Comment: Performed at [facility], [address], [place], [place] [address]  Vitamin B12     Status: Abnormal   Collection Time: 07/27/24  2:30 AM  Result Value Ref Range   Vitamin B-12 >4,000 (H) 180 - 914 pg/mL    Comment: Performed at [facility], [address] [place], [place] [address]  Iron and TIBC     Status: Abnormal   Collection Time: 07/27/24  2:30 AM  Result Value Ref Range   Iron 18 (L) 28 - 170 ug/dL   TIBC 785 (L) 749 - 549 ug/dL   Saturation Ratios 8 (L) 10.4 - 31.8 %   UIBC 196 ug/dL    Comment: Performed at [facility], [address] [place], [place] [address]  CBC     Status: Abnormal   Collection Time: 07/27/24  2:33 AM  Result Value Ref Range   WBC 9.0 4.0 - 10.5 K/uL   RBC 3.72 (L) 3.87 - 5.11 MIL/uL   Hemoglobin 10.0 (L) 12.0 - 15.0 g/dL   HCT 68.1 (L) 63.9 - 53.9 %   MCV 85.5 80.0 - 100.0 fL   MCH 26.9 26.0 - 34.0 pg   MCHC 31.4 30.0 - 36.0 g/dL   RDW 85.1 88.4 - 84.4 %   Platelets 415 (H) 150 - 400 K/uL   nRBC 0.0 0.0 - 0.2 %    Comment: Performed at [facility], [address] [place], [place] [address]  Comprehensive metabolic panel     Status: Abnormal   Collection Time: 07/27/24  2:33 AM  Result Value Ref Range  Sodium 134 (L) 135 -  145 mmol/L   Potassium 3.7 3.5 - 5.1 mmol/L   Chloride 97 (L) 98 - 111 mmol/L   CO2 21 (L) 22 - 32 mmol/L   Glucose, Bld 100 (H) 70 - 99 mg/dL    Comment: Glucose reference range applies only to samples taken after fasting for at least 8 hours.   BUN 8 8 - 23 mg/dL   Creatinine, Ser 9.40 0.44 - 1.00 mg/dL   Calcium 9.1 8.9 - 89.6 mg/dL   Total Protein 5.7 (L) 6.5 - 8.1 g/dL   Albumin 3.2 (L) 3.5 - 5.0 g/dL   AST 24 15 - 41 U/L   ALT 9 0 - 44 U/L   Alkaline Phosphatase 110 38 - 126 U/L   Total Bilirubin 0.8 0.0 - 1.2 mg/dL   GFR, Estimated >39 >39 mL/min    Comment: (NOTE) Calculated using the CKD-EPI Creatinine Equation (2021)    Anion gap 15 5 - 15    Comment: Performed at [facility], [address], [place], [place] [address]  Cortisol     Status: None   Collection Time: 07/27/24  2:33 AM  Result Value Ref Range   Cortisol, Plasma 17.9 ug/dL    Comment: (NOTE) AM    6.7 - 22.6 ug/dL PM   <89.9       ug/dL Performed at [facility], [address] [place], [place] [address]   Folate     Status: None   Collection Time: 07/27/24  2:33 AM  Result Value Ref Range   Folate >20.0 >5.9 ng/mL    Comment: Performed at [facility], [address], [place], [place] [address]    CT HEAD WO CONTRAST ( ) Result Date: 07/27/2024 CLINICAL DATA:  Headache, new onset (Age >= 51y) EXAM: CT HEAD WITHOUT CONTRAST TECHNIQUE: Contiguous axial images were obtained from the base of the skull through the vertex without intravenous contrast. RADIATION DOSE REDUCTION: This exam was performed according to the departmental dose-optimization program which includes automated exposure control, adjustment of the mA and/or kV according to patient size and/or use of iterative reconstruction technique. COMPARISON:  None Available. FINDINGS: Brain: Diffuse brain atrophy pattern and chronic white matter microvascular ischemic changes throughout both cerebral  hemispheres. No acute intracranial hemorrhage, mass lesion, infarction, midline shift, herniation, hydrocephalus, or extra-axial fluid collection. No focal mass effect or edema. Cisterns are patent. Cerebellar atrophy as well. Vascular: No hyperdense vessel or unexpected calcification. Skull: Normal. Negative for fracture or focal lesion. Sinuses/Orbits: No acute finding. Other: None. IMPRESSION: 1. Brain atrophy and chronic white matter microvascular ischemic changes. 2. No acute intracranial abnormality by noncontrast CT. Electronically Signed   By: CHRISTELLA.  Shick M.D.   On: 07/27/2024 09:59   CT Angio Chest PE W and/or Wo Contrast Result Date: 07/26/2024 CLINICAL DATA:  Chest pain with left arm swelling, initial encounter EXAM: CT ANGIOGRAPHY CHEST WITH CONTRAST TECHNIQUE: Multidetector CT imaging of the chest was performed using the standard protocol during bolus administration of intravenous contrast. Multiplanar CT image reconstructions and MIPs were obtained to evaluate the vascular anatomy. RADIATION DOSE REDUCTION: This exam was performed according to the departmental dose-optimization program which includes automated exposure control, adjustment of the mA and/or kV according to patient size and/or use of iterative reconstruction technique. CONTRAST:  75mL OMNIPAQUE  IOHEXOL  350 MG/ML SOLN COMPARISON:  None Available. FINDINGS: Cardiovascular: Thoracic aorta and its branches are well visualize without evidence of dissection. Aneurysmal dilatation  of the ascending aorta is noted to 6 cm. Normal tapering in the aortic arch is noted. The descending aorta demonstrates atherosclerotic calcification without complicating factors. The pulmonary artery shows a normal branching pattern bilaterally. No filling defect to suggest pulmonary embolism is noted. In the left neck there is considerable inflammatory change surrounding the left internal jugular vein which appears enlarged in size. This raises suspicion for  underlying deep venous thrombosis. Similar findings are seen in the left shoulder involving the subclavian vein. Mediastinum/Nodes: Thoracic inlet is otherwise within normal limits. No hilar or mediastinal adenopathy is noted. The esophagus as visualized is within normal limits. Lungs/Pleura: Lungs are well aerated bilaterally. No focal infiltrate or sizable parenchymal nodule is seen. No effusions are noted. Mild scarring is noted within the right middle lobe. Upper Abdomen: No acute abnormality. Musculoskeletal: No acute bony abnormality is noted. Review of the MIP images confirms the above findings. IMPRESSION: Changes highly suggestive of deep venous thrombosis within the left internal jugular vein as well as the left subclavian vein. This would correspond with the patient's given clinical history of left arm swelling. Duplex ultrasound is recommended for further evaluation. No evidence of pulmonary emboli. Dilatation of the ascending aorta to 6 cm. Recommend semi-annual imaging followup by CTA or MRA and referral to cardiothoracic surgery if not already obtained. This recommendation follows 2010 ACCF/AHA/AATS/ACR/ASA/SCA/SCAI/SIR/STS/SVM Guidelines for the Diagnosis and Management of Patients With Thoracic Aortic Disease. Circulation. 2010; 121: [id number]. Aortic aneurysm NOS (ICD10-I71.9) Right middle lobe scarring. Electronically Signed   By: Oneil Devonshire M.D.   On: 07/26/2024 21:13   DG Chest Port 1 View Result Date: 07/26/2024 CLINICAL DATA:  shob EXAM: PORTABLE CHEST 1 VIEW COMPARISON:  Chest x-ray 12/26/2013 FINDINGS: The heart and mediastinal contours are grossly unremarkable in the setting of patient leaning forward. No focal consolidation. No pulmonary edema. No pleural effusion. No pneumothorax. No acute osseous abnormality. IMPRESSION: No acute cardiopulmonary disease. Slightly limited evaluation due to patient positioning. Consider repeat chest x-ray PA and lateral view for further evaluation.  Electronically Signed   By: Morgane  Naveau M.D.   On: 07/26/2024 19:44    Review of Systems  Constitutional:  Positive for activity change and fatigue.  Cardiovascular:  Negative for chest pain and leg swelling.       Left arm swelling   Blood pressure 126/81, pulse 100, temperature 98.5 F (36.9 C), temperature source Oral, resp. rate 16, SpO2 100%. Physical Exam Vitals reviewed.  Constitutional:      Appearance: Normal appearance.  Eyes:     General: No scleral icterus.    Extraocular Movements: Extraocular movements intact.  Cardiovascular:     Rate and Rhythm: Normal rate and regular rhythm.     Heart sounds: Normal heart sounds. No murmur heard. Pulmonary:     Effort: Pulmonary effort is normal. No respiratory distress.     Breath sounds: Normal breath sounds. No wheezing or rales.  Abdominal:     General: There is no distension.     Palpations: Abdomen is soft.  Musculoskeletal:        General: Swelling (left upper extremity) present.  Neurological:     General: No focal deficit present.     Mental Status: She is alert and oriented to person, place, and time.     Cranial Nerves: No cranial nerve deficit.     Motor: No weakness.     Assessment/Plan: 83 year-old woman with history of breast cancer who presents with left arm swelling.  W/u revealed left internal jugular and subclavian DVT and a large ascending/ proximal arch aneurysm.  Suspect aneurysm causing mass effect on innominate vein- SVC junction contributing to DVT.  No Cp or symptoms to suggest acute aortic syndrome.  Ascending aneurysm ~ 6 cm.  Repair is indicated, but timing tricky in this situation.  Normally would wait 3 months in setting of acute DVt but I think that is too long in her case.  Discussed with my partners and consensus would be to anticoagulate for 3-4 weeks then proceed with repair if she will do so.  I talked with her about the findings and need for surgical intervention.  Briefly  described what surgery would entail.  She does not want to pursue surgery at this time.  At my age what do I have to live for?  Some of this may be due to frustration and fatigue as well as the surprise of the finding.  Will discuss with her further when she has had some time to come to grips with the situation.  She should get an echo to assess aortic valve and LV function.    Would need coronary imaging, either cath or CT, prior to surgery should she consider that.  Lindsey [name] 07/27/2024, 10:07 AM

## 2024-07-27 NOTE — ED Notes (Signed)
[facility] arrived to transport patient.  

## 2024-07-27 NOTE — ED Notes (Signed)
 Pt finishing breakfast and then we will draw blood

## 2024-07-27 NOTE — ED Notes (Signed)
 [facility] called.

## 2024-07-27 NOTE — Progress Notes (Addendum)
 PHARMACY - ANTICOAGULATION CONSULT NOTE  Pharmacy Consult for heparin  Indication: VTE treatment  No Known Allergies  Patient Measurements:    Vital Signs: Temp: 98.5 F (36.9 C) (09/17 0918) Temp Source: Oral (09/17 0918) BP: 126/81 (09/17 0918) Pulse Rate: 100 (09/17 0918)  Labs: Recent Labs    07/26/24 1657 07/27/24 0233 07/27/24 0939 07/27/24 1245  HGB 10.1* 10.0*  --   --   HCT 32.0* 31.8*  --   --   PLT 406* 415*  --   --   HEPARINUNFRC  --   --  <0.10* <0.10*  CREATININE 0.74 0.59  --   --   CKTOTAL 15*  --   --   --     CrCl cannot be calculated (Unknown ideal weight.).   Assessment: 83 year old female history of breast cancer and known ascending aortic aneurysm, comes in with chief complaint of SOB and LUE swelling x3-4 days. CTA negative for PE, but concerning for left internal jugular and subclavian thrombus. Pharmacy consulted to dose heparin  for VTE treatment; no prior AC noted.  Baseline INR, aPTT: not done Prior anticoagulation: none  Significant events:  Today, 07/27/2024: CBC: Hgb slightly low but stable; Plt elevated Initial heparin  level drawn in AM undetectable, but concern for nontrivial pump interruption just prior to level being drawn Repeat heparin  level again undetectable on 900 units/hr No bleeding or infusion issues per nursing  Goal of Therapy: Heparin  level 0.3-0.7 units/ml Monitor platelets by anticoagulation protocol: Yes  Plan: Heparin  2000 units IV bolus x 1 Increase heparin  IV infusion to 1200 units/hr Check heparin  level 8 hrs after rate change Daily CBC, daily heparin  level once stable Monitor for signs of bleeding or thrombosis Follow-up plans for long-term anticoagulation  Bard Jeans, PharmD, BCPS [phone] 07/27/2024, 1:25 PM

## 2024-07-27 NOTE — ED Notes (Signed)
 Report called to 5N

## 2024-07-27 NOTE — Progress Notes (Signed)
 Left upper extremity venous duplex has been completed. Preliminary results can be found in CV Proc through chart review.  Results were given to Dr. Raenelle.  07/27/24 10:50 AM Cathlyn Collet RVT

## 2024-07-27 NOTE — ED Notes (Signed)
 Patient used female urinal at side of the bed

## 2024-07-28 ENCOUNTER — Other Ambulatory Visit (HOSPITAL_COMMUNITY): Payer: Self-pay

## 2024-07-28 ENCOUNTER — Telehealth (HOSPITAL_COMMUNITY): Payer: Self-pay | Admitting: Pharmacy Technician

## 2024-07-28 ENCOUNTER — Inpatient Hospital Stay (HOSPITAL_COMMUNITY)

## 2024-07-28 DIAGNOSIS — I7121 Aneurysm of the ascending aorta, without rupture: Secondary | ICD-10-CM

## 2024-07-28 DIAGNOSIS — I82622 Acute embolism and thrombosis of deep veins of left upper extremity: Secondary | ICD-10-CM | POA: Diagnosis not present

## 2024-07-28 LAB — ECHOCARDIOGRAM COMPLETE
AR max vel: 2.41 cm2
AV Peak grad: 7.5 mmHg
AV Vena cont: 0.6 cm
Ao pk vel: 1.37 m/s
Area-P 1/2: 4.36 cm2
Height: 66 in
S' Lateral: 2.4 cm
Weight: 1950.63 [oz_av]

## 2024-07-28 LAB — CBC
HCT: 31.3 % — ABNORMAL LOW (ref 36.0–46.0)
Hemoglobin: 10.2 g/dL — ABNORMAL LOW (ref 12.0–15.0)
MCH: 27.9 pg (ref 26.0–34.0)
MCHC: 32.6 g/dL (ref 30.0–36.0)
MCV: 85.8 fL (ref 80.0–100.0)
Platelets: 423 K/uL — ABNORMAL HIGH (ref 150–400)
RBC: 3.65 MIL/uL — ABNORMAL LOW (ref 3.87–5.11)
RDW: 14.7 % (ref 11.5–15.5)
WBC: 9.8 K/uL (ref 4.0–10.5)
nRBC: 0 % (ref 0.0–0.2)

## 2024-07-28 LAB — HEPARIN LEVEL (UNFRACTIONATED)
Heparin Unfractionated: <0.1 [IU]/mL — ABNORMAL LOW (ref 0.30–0.70)
Heparin Unfractionated: 0.25 [IU]/mL — ABNORMAL LOW (ref 0.30–0.70)
Heparin Unfractionated: 0.21 [IU]/mL — ABNORMAL LOW (ref 0.30–0.70)

## 2024-07-28 MED ORDER — HEPARIN BOLUS VIA INFUSION
1000.0000 [IU] | Freq: Once | INTRAVENOUS | Status: AC
Start: 2024-07-28 — End: 2024-07-28
  Administered 2024-07-28: 1000 [IU] via INTRAVENOUS

## 2024-07-28 MED ORDER — HEPARIN BOLUS VIA INFUSION
3000.0000 [IU] | Freq: Once | INTRAVENOUS | Status: AC
  Administered 2024-07-28: 3000 [IU] via INTRAVENOUS
  Filled 2024-07-28: qty 3000

## 2024-07-28 NOTE — Progress Notes (Signed)
 PHARMACY - ANTICOAGULATION CONSULT NOTE  Pharmacy Consult for heparin  Indication: VTE treatment  No Known Allergies  Patient Measurements: Height: 5' 6 (167.6 cm) Weight: 55.3 kg (121 lb 14.6 oz) IBW/kg (Calculated) : 59.3 HEPARIN  DW (KG): 55.3  Vital Signs: Temp: 98.4 F (36.9 C) (09/18 2010) Temp Source: Oral (09/18 2010) BP: 89/56 (09/18 2031) Pulse Rate: 102 (09/18 2010)  Labs: Recent Labs    07/26/24 1657 07/27/24 0233 07/27/24 0939 07/27/24 2329 07/28/24 0559 07/28/24 0916 07/28/24 2117  HGB 10.1* 10.0*  --   --  10.2*  --   --   HCT 32.0* 31.8*  --   --  31.3*  --   --   PLT 406* 415*  --   --  423*  --   --   HEPARINUNFRC  --   --    < > <0.10*  --  0.25* 0.21*  CREATININE 0.74 0.59  --   --   --   --   --   CKTOTAL 15*  --   --   --   --   --   --    < > = values in this interval not displayed.    Estimated Creatinine Clearance: 46.5 mL/min (by C-G formula based on SCr of 0.59 mg/dL).   Assessment: 83 year old female history of breast cancer and known ascending aortic aneurysm, comes in with chief complaint of SOB and LUE swelling x3-4 days. CTA negative for PE, but concerning for left internal jugular and subclavian thrombus. Pharmacy consulted to dose heparin  for VTE treatment; no prior AC noted.  9/18 PM Heparin  level 0.21 under goal range on current rate. No issues with heparin  infusion or s/sx bleeding reported.   Goal of Therapy: Heparin  level 0.3-0.7 units/ml Monitor platelets by anticoagulation protocol: Yes  Plan:  IV heparin  bolus 1000 units x1 Increase heparin  infusion to 1600 units/hr Check heparin  level in 8 hours and daily while on heparin  Continue to monitor H&H and platelets  Thank you for allowing pharmacy to be a part of this patient's care.  Larraine Brazier, PharmD Clinical Pharmacist 07/28/2024  10:09 PM **Pharmacist phone directory can now be found on [email or web address] ([email or web address]).  Listed under [facility].

## 2024-07-28 NOTE — Progress Notes (Signed)
 PHARMACY - ANTICOAGULATION CONSULT NOTE  Pharmacy Consult for heparin  Indication: VTE treatment  No Known Allergies  Patient Measurements: Height: 5' 6 (167.6 cm) Weight: 55.3 kg (121 lb 14.6 oz) IBW/kg (Calculated) : 59.3 HEPARIN  DW (KG): 55.3  Vital Signs: Temp: 98.4 F (36.9 C) (09/17 2116) Temp Source: Oral (09/17 2116) BP: 126/85 (09/17 2116) Pulse Rate: 96 (09/17 2116)  Labs: Recent Labs    07/26/24 1657 07/27/24 0233 07/27/24 0939 07/27/24 1245 07/27/24 2329  HGB 10.1* 10.0*  --   --   --   HCT 32.0* 31.8*  --   --   --   PLT 406* 415*  --   --   --   HEPARINUNFRC  --   --  <0.10* <0.10* <0.10*  CREATININE 0.74 0.59  --   --   --   CKTOTAL 15*  --   --   --   --     Estimated Creatinine Clearance: 46.5 mL/min (by C-G formula based on SCr of 0.59 mg/dL).   Assessment: 83 year old female history of breast cancer and known ascending aortic aneurysm, comes in with chief complaint of SOB and LUE swelling x3-4 days. CTA negative for PE, but concerning for left internal jugular and subclavian thrombus. Pharmacy consulted to dose heparin  for VTE treatment; no prior AC noted.  Baseline INR, aPTT: not done Prior anticoagulation: none  Heparin  level came back subtherapeutic again this PM. No issue with drip per Rn. We will rebolus and increase rate. Check another level in AM.   Goal of Therapy: Heparin  level 0.3-0.7 units/ml Monitor platelets by anticoagulation protocol: Yes  Plan: Heparin  3000 units IV bolus x 1 Increase heparin  IV infusion to 1400 units/hr Check heparin  level 8 hrs after rate change Daily CBC, daily heparin  level once stable Monitor for signs of bleeding or thrombosis Follow-up plans for long-term anticoagulation  Sergio Batch, PharmD, BCIDP, AAHIVP, CPP Infectious Disease Pharmacist 07/28/2024 1:08 AM

## 2024-07-28 NOTE — Consult Note (Addendum)
 Hospital Consult    Reason for Consult:  LUE DVT Requesting Physician:  [facility] MRN #:  [id number]  History of Present Illness: Lindsey [name] is a 83 y.o. female with a past medical history of breast cancer s/p left-sided lumpectomy who [facility] on 9/16 with several days of left arm swelling and discomfort.  CTA of the chest demonstrated a 6 cm ascending aortic aneurysm and extensive DVT in the left subclavian and internal jugular veins.  Left upper extremity DVT study confirmed significant DVT in the left internal jugular, subclavian, axillary, and brachial veins.  She was started on a heparin  drip and transferred to [facility] for further management.  We were consulted for management of the patient's DVT.  The patient denies any prior history of DVT.  She says that her left arm started swelling several days ago and got progressively worse.  This causes her aching discomfort.  She denies any chest pain.  She denies any prior knowledge of her aortic aneurysm.  Overall she is frustrated about what is going on with her body and feels like she is being  railroaded into making decisions.   Since starting the heparin  drip she does not feel like her left arm swelling has gotten much better.  Past Medical History:  Diagnosis Date   Breast cancer (HCC) 01/03/14   left, 12 o'clock, upper outer   Radiation 03/09/14-03/31/14   Left Breast    Temporal arteritis [facility])     Past Surgical History:  Procedure Laterality Date   ARTERY BIOPSY Right 12/27/2013   Procedure: BIOPSY TEMPORAL ARTERY;  Surgeon: Camellia CHRISTELLA Blush, MD;  Location: [facility] OR;  Service: General;  Laterality: Right;   BREAST SURGERY  01/03/14   breast biopsy   TUBAL LIGATION     many years ago    No Known Allergies  Prior to Admission medications   Medication Sig Start Date End Date Taking? Authorizing Provider  ascorbic acid (VITAMIN C) 500 MG tablet Take 500 mg by mouth 3 (three) times a week.   Yes [provider]  B Complex Vitamins (VITAMIN B COMPLEX) TABS Take 1 tablet by mouth 3 (three) times a week.   Yes [provider]  calcium carbonate (OSCAL) 1500 (600 Ca) MG TABS tablet Take 1,500 mg by mouth 3 (three) times a week.   Yes [provider]  cholecalciferol (VITAMIN D3) 25 MCG (1000 UNIT) tablet Take 1,000 Units by mouth 3 (three) times a week.   Yes [provider]  loperamide  (IMODIUM ) 2 MG capsule Take 1 capsule (2 mg total) by mouth 4 (four) times daily as needed for diarrhea or loose stools. Patient not taking: Reported on 07/26/2024 08/02/22   Ellouise Richerd POUR, DO    Social History   Socioeconomic History   Marital status: Widowed    Spouse name: Not on file   Number of children: Not on file   Years of education: Not on file   Highest education level: Not on file  Occupational History   Not on file  Tobacco Use   Smoking status: Never    Passive exposure: Never   Smokeless tobacco: Never  Vaping Use   Vaping status: Never Used  Substance and Sexual Activity   Alcohol use: No    Comment: rare occaisons glass of wine   Drug use: No   Sexual activity: Never    Comment: menarche age 32, P21, first live birth age 31, menopause age 59,  no HRT  Other Topics Concern   Not on file  Social History Narrative   Not on file   Social Drivers of Health   Financial Resource Strain: Low Risk  (06/13/2022)   Overall Financial Resource Strain (CARDIA)    Difficulty of Paying Living Expenses: Not hard at all  Food Insecurity: No Food Insecurity (06/13/2022)   Hunger Vital Sign    Worried About Running Out of Food in the Last Year: Never true    Ran Out of Food in the Last Year: Never true  Transportation Needs: No Transportation Needs (06/13/2022)   PRAPARE - Administrator, Civil Service (Medical): No    Lack of Transportation (Non-Medical): No  Physical Activity: Inactive (06/13/2022)   Exercise Vital Sign    Days of Exercise per Week:  0 days    Minutes of Exercise per Session: 0 min  Stress: No Stress Concern Present (06/13/2022)   [employer] of Occupational Health - Occupational Stress Questionnaire    Feeling of Stress : Not at all  Social Connections: Moderately Integrated (06/13/2022)   Social Connection and Isolation Panel    Frequency of Communication with Friends and Family: More than three times a week    Frequency of Social Gatherings with Friends and Family: Twice a week    Attends Religious Services: More than 4 times per year    Active Member of [employer] or Organizations: Yes    Attends Banker Meetings: More than 4 times per year    Marital Status: Widowed  Intimate Partner Violence: Not At Risk (06/13/2022)   Humiliation, Afraid, Rape, and Kick questionnaire    Fear of Current or Ex-Partner: No    Emotionally Abused: No    Physically Abused: No    Sexually Abused: No    Family History  Problem Relation Age of Onset   Diabetes Paternal Uncle    Alzheimer's disease Mother     ROS: Otherwise negative unless mentioned in HPI  Physical Examination  Vitals:   07/28/24 0407 07/28/24 0710  BP: (!) 137/93 120/73  Pulse: (!) 115 97  Resp: 19 18  Temp: (!) 97.4 F (36.3 C) 98 F (36.7 C)  SpO2: 98% 98%   Body mass index is 19.68 kg/m.  General:  WDWN in NAD Gait: Not observed HENT: WNL, normocephalic Pulmonary: normal non-labored breathing Cardiac: regular Abdomen: nondistended Skin: without rashes Vascular Exam/Pulses: palpable left radial pulse Extremities: significant pitting edema of the LUE from the hand to the upper arm. Muscle compartments are soft Musculoskeletal: no muscle wasting or atrophy  Neurologic: A&O X 3;  No focal weakness or paresthesias are detected; speech is fluent/normal Psychiatric:  The pt has Normal affect. Lymph:  Unremarkable  CBC    Component Value Date/Time   WBC 9.8 07/28/2024 0559   RBC 3.65 (L) 07/28/2024 0559   HGB 10.2 (L) 07/28/2024  0559   HGB 12.4 06/04/2022 1212   HGB 13.0 07/21/2017 1217   HCT 31.3 (L) 07/28/2024 0559   HCT 38.6 06/04/2022 1212   HCT 39.4 07/21/2017 1217   PLT 423 (H) 07/28/2024 0559   PLT 331 06/04/2022 1212   MCV 85.8 07/28/2024 0559   MCV 87 06/04/2022 1212   MCV 91.5 07/21/2017 1217   MCH 27.9 07/28/2024 0559   MCHC 32.6 07/28/2024 0559   RDW 14.7 07/28/2024 0559   RDW 13.5 06/04/2022 1212   RDW 13.7 07/21/2017 1217   LYMPHSABS 0.9 07/26/2024 1657   LYMPHSABS 1.4  06/04/2022 1212   LYMPHSABS 1.1 07/21/2017 1217   MONOABS 1.2 (H) 07/26/2024 1657   MONOABS 0.5 07/21/2017 1217   EOSABS 0.0 07/26/2024 1657   EOSABS 0.1 06/04/2022 1212   BASOSABS 0.0 07/26/2024 1657   BASOSABS 0.0 06/04/2022 1212   BASOSABS 0.0 07/21/2017 1217    BMET    Component Value Date/Time   NA 134 (L) 07/27/2024 0233   NA 136 06/04/2022 1212   NA 138 07/21/2017 1217   K 3.7 07/27/2024 0233   K 4.4 07/21/2017 1217   CL 97 (L) 07/27/2024 0233   CO2 21 (L) 07/27/2024 0233   CO2 26 07/21/2017 1217   GLUCOSE 100 (H) 07/27/2024 0233   GLUCOSE 121 07/21/2017 1217   BUN 8 07/27/2024 0233   BUN 11 06/04/2022 1212   BUN 12.3 07/21/2017 1217   CREATININE 0.59 07/27/2024 0233   CREATININE 0.97 07/20/2019 1342   CREATININE 1.0 07/21/2017 1217   CALCIUM 9.1 07/27/2024 0233   CALCIUM 10.2 07/21/2017 1217   GFRNONAA >60 07/27/2024 0233   GFRNONAA 56 (L) 07/20/2019 1342   GFRAA >60 02/08/2020 1103   GFRAA >60 07/20/2019 1342    COAGS: No results found for: INR, PROTIME   Non-Invasive Vascular Imaging:   CTA Chest PE (07/26/2024) Ascending aorta dilation up to 6 cm  LUE DVT Study (07/27/2024) Acute thrombus in the left IJ, subclavian, axillary, and brachial veins   ASSESSMENT/PLAN: This is a 83 y.o. female admitted with left upper extremity DVT and ascending aorta aneurysm   -The patient [facility] 2 days ago with progressive left upper extremity swelling and discomfort.  CTA chest  demonstrated a 6 cm ascending aortic aneurysm and significant DVT in the left IJ and subclavian veins.  Left upper extremity DVT study confirmed DVT in the left IJ, subclavian, axillary, and brachial veins.  She was started on a heparin  drip and transferred to [facility] for further management -At this time the patient feels like her left arm swelling is about the same.  This causes her aching discomfort.  She has no known prior history of DVT - On exam she has significant pitting edema of the left upper extremity from the hand to the upper arm.  Her arm is tender to touch.  She does have a palpable left radial pulse and her compartments are soft.  No concerns for phlegmasia at this time - The patient has already been evaluated by CT surgery.  We have also discussed with the patient that her left upper extremity DVT is likely due to mass effect on the innominate vein due to her large aortic aneurysm.  The patient is aware that her aortic aneurysm warrants treatment given its size and compressive effect on the innominate vein.  At this time she does not want aneurysm repair. -Dr.Latica Hohmann was present at the bedside and also explained to the patient that left upper extremity DVT thrombectomy likely would not help with her swelling much, especially without aneurysm repair.  - We have wrapped the arm with a compressive wrap and propped it up on several pillows to help with her swelling.  She is not a candidate for DVT thrombectomy at this time.  She should continue her heparin  drip   Ahmed Holster PA-C [facility] [phone]    I have interviewed and examined patient with PA and agree with assessment and plan above.   King Pinzon C. Sheree, MD [facility] [place] Office: [phone] Pager: [phone]

## 2024-07-28 NOTE — Progress Notes (Signed)
 PHARMACY - ANTICOAGULATION CONSULT NOTE  Pharmacy Consult for heparin  Indication: VTE treatment  No Known Allergies  Patient Measurements: Height: 5' 6 (167.6 cm) Weight: 55.3 kg (121 lb 14.6 oz) IBW/kg (Calculated) : 59.3 HEPARIN  DW (KG): 55.3  Vital Signs: Temp: 98 F (36.7 C) (09/18 0710) Temp Source: Oral (09/17 2116) BP: 120/73 (09/18 0710) Pulse Rate: 97 (09/18 0710)  Labs: Recent Labs    07/26/24 1657 07/27/24 0233 07/27/24 0939 07/27/24 1245 07/27/24 2329 07/28/24 0559  HGB 10.1* 10.0*  --   --   --  10.2*  HCT 32.0* 31.8*  --   --   --  31.3*  PLT 406* 415*  --   --   --  423*  HEPARINUNFRC  --   --  <0.10* <0.10* <0.10*  --   CREATININE 0.74 0.59  --   --   --   --   CKTOTAL 15*  --   --   --   --   --     Estimated Creatinine Clearance: 46.5 mL/min (by C-G formula based on SCr of 0.59 mg/dL).   Assessment: 83 year old female history of breast cancer and known ascending aortic aneurysm, comes in with chief complaint of SOB and LUE swelling x3-4 days. CTA negative for PE, but concerning for left internal jugular and subclavian thrombus. Pharmacy consulted to dose heparin  for VTE treatment; no prior AC noted.  Heparin  level 0.25 under goal range on current rate. No issues with heparin  infusion or s/sx bleeding reported.   Goal of Therapy: Heparin  level 0.3-0.7 units/ml Monitor platelets by anticoagulation protocol: Yes  Plan:  IV heparin  bolus 1000 units x1 Increase heparin  infusion to 1500 units/hr Check heparin  level in 8 hours and daily while on heparin  Continue to monitor H&H and platelets  Thank you for allowing pharmacy to be a part of this patient's care.  Shelba Collier, PharmD, BCPS Clinical Pharmacist

## 2024-07-28 NOTE — Progress Notes (Signed)
 PROGRESS NOTE  Lindsey [name]  DOB: 06/21/1941  PCP: Joshua Debby CROME, MD FMW:[id number]  DOA: 07/26/2024  LOS: 2 days  Hospital Day: 3  Brief narrative: Lindsey [name] is a 83 y.o. female with PMH significant for left breast cancer s/p postlumpectomy, radiation, tamoxifen , h/o temporal arteritis almost treated for 10 years with prednisone .  9/16, patient presented to the ED with complaint of 4 days of progressive swelling of the left arm and discomfort.  In July, she was treated with a month-long course of prednisone  for headache.    In the ED, patient was hemodynamically stable. D-dimer was elevated CT angiogram of the chest showed -extensive DVT left subclavian, left internal jugular vein.   -6 cm aortic aneurysm without complications.  Upper extremity DVT study showed extensive DVTs of all veins involving left arm. Started on heparin  drip Admitted to [facility]  CT surgery and vascular surgery consulted   Subjective: Patient was seen and examined this afternoon. Pleasant elderly Caucasian female.  Sitting up in exam.  Not in distress.  Has tight compression wraps around her left upper extremity.  Her daughter at bedside. Per RN, she went tachycardic up to 150s on minimal ambulation earlier. Chart reviewed In the last 24 hours, remains afebrile, he medically stable, breathing on room air Labs from this morning with WC count 9.8, hemoglobin 10.2,  echocardiogram pending  Assessment and plan: Extensive left upper extremity DVTs Aortic aneurysm Presented with progressive left arm swelling and discomfort for 4 days. Imagings as above showing extensive DVT as well as 6 cm aortic aneurysm On heparin  drip Seen by vascular surgery, CT surgery Etiology -likely from the mass effect of innominate vein due to large aortic aneurysm Aneurysm repair was offered.  It seems patient refused it.  Per vascular surgery, thrombectomy not expected to help the swelling without aneurysm repair  To  continue heparin  drip for now. Compression wrap and elevation started.  Sinus tachycardia Per RN, she went tachycardic up to 150s on minimal ambulation earlier. At rest, heart rate in 90s. Although CTA chest on admission did not show PE, patient is at risk of PE at any time as well as the venous thrombosis is present. She might have thrown some clot and gotten a PE.  No other symptoms other than tachycardia on exertion.  I do not think repeating the CTA to confirm would add any value as he is already on heparin  drip.  Headache H/o temporal arteritis Currently she does not have any clinical picture consistent with temporal arteritis.   Symptomatic management.   H/o left breast cancer  s/p postlumpectomy, radiation, tamoxifen    Mobility: Independent at baseline  Goals of care   Code Status: Full Code     DVT prophylaxis: Heparin  drip    Antimicrobials: None Fluid: None Consultants: Vascular surgery, cardiothoracic surgery Family Communication: [name]  Status: Inpatient Level of care:  Telemetry Medical   Patient is from: Home Needs to continue in-hospital care: Remains on heparin  drip Anticipated d/c to: Home hopefully 1 to 2 days   Diet:  Diet Order             Diet regular Room service appropriate? Yes; Fluid consistency: Thin  Diet effective now                   Scheduled Meds:    PRN meds: acetaminophen  **OR** acetaminophen    Infusions:   heparin  1,500 Units/hr (07/28/24 1440)    Antimicrobials: Anti-infectives (From admission,  onward)    None       Objective: Vitals:   07/28/24 0407 07/28/24 0710  BP: (!) 137/93 120/73  Pulse: (!) 115 97  Resp: 19 18  Temp: (!) 97.4 F (36.3 C) 98 F (36.7 C)  SpO2: 98% 98%   No intake or output data in the 24 hours ending 07/28/24 1456 Filed Weights   07/27/24 2116  Weight: 55.3 kg   Weight change:  Body mass index is 19.68 kg/m.   Physical Exam: General exam: Pleasant,  elderly.  Not in pain Skin: No rashes, lesions or ulcers. HEENT: Atraumatic, normocephalic, no obvious bleeding Lungs: Clear to auscultation bilaterally,  CVS: S1, S2, no murmur,   GI/Abd: Soft, nontender, nondistended, bowel sound present,   CNS: Alert, awake, oriented x 3.  Hard of hearing Psychiatry: Mood appropriate Extremities: No pedal edema, no calf tenderness, left upper extremity with tight compression wrap  Data Review: I have personally reviewed the laboratory data and studies available.  F/u labs ordered Unresulted Labs (From admission, onward)     Start     Ordered   07/29/24 0500  Heparin  level (unfractionated)  Daily,   R      07/28/24 0106   07/28/24 2130  Heparin  level (unfractionated)  Once-Timed,   TIMED        07/28/24 1323   07/27/24 0500  CBC  Daily,   R      07/26/24 2225            Signed, Chapman Rota, MD [facility] 07/28/2024

## 2024-07-28 NOTE — Progress Notes (Signed)
 Mobility Specialist Progress Note:   07/28/24 1035  Mobility  Activity Ambulated with assistance  Level of Assistance Contact guard assist, steadying assist  Assistive Device Other (Comment) (IV Pole)  Distance Ambulated (ft) 100 ft  Activity Response Tolerated well  Mobility Referral Yes  Mobility visit 1 Mobility  Mobility Specialist Start Time (ACUTE ONLY) 0950  Mobility Specialist Stop Time (ACUTE ONLY) 1002  Mobility Specialist Time Calculation (min) (ACUTE ONLY) 12 min   Pt received in bed hesitant but agreeable to mobility. No physical assistance needed. Took x1 seated rest break d/t fatigue. HR increased to 155 but pt had no c/o pain. Once HR went back down returned to room. Left in bed w/ call bell and personal belongings in reach. All needs met.   Pre Mobility HR 99 During Mobility HR 115-155 Post Mobility HR 117  Thersia Minder Mobility Specialist  Please contact vis Secure Chat or  Rehab Office [phone]

## 2024-07-28 NOTE — Telephone Encounter (Signed)
 Pharmacy Patient Advocate Encounter  Insurance verification completed.    The patient is insured through [employer]. Patient has Medicare and is not eligible for a copay card, but may be able to apply for patient assistance or Medicare RX Payment Plan (Patient Must reach out to their plan, if eligible for payment plan), if available.    Ran test claim for Eliquis 5mg  tablets and the current 30 day co-pay is $200.05.  Ran test claim for Xarelto 20mg  tablets and the current 30 day co-pay is $197.97.  Copay applied to the deductible of $250.00.   This test claim was processed through [facility]- copay amounts may vary at other pharmacies due to pharmacy/plan contracts, or as the patient moves through the different stages of their insurance plan.

## 2024-07-28 NOTE — Progress Notes (Signed)
 Echocardiogram 2D Echocardiogram has been performed.  Lindsey [name] 07/28/2024, 11:57 AM

## 2024-07-29 DIAGNOSIS — R000 Tachycardia, unspecified: Secondary | ICD-10-CM

## 2024-07-29 DIAGNOSIS — I82622 Acute embolism and thrombosis of deep veins of left upper extremity: Secondary | ICD-10-CM | POA: Diagnosis not present

## 2024-07-29 LAB — CBC
HCT: 32 % — ABNORMAL LOW (ref 36.0–46.0)
Hemoglobin: 10.4 g/dL — ABNORMAL LOW (ref 12.0–15.0)
MCH: 28.1 pg (ref 26.0–34.0)
MCHC: 32.5 g/dL (ref 30.0–36.0)
MCV: 86.5 fL (ref 80.0–100.0)
Platelets: 404 K/uL — ABNORMAL HIGH (ref 150–400)
RBC: 3.7 MIL/uL — ABNORMAL LOW (ref 3.87–5.11)
RDW: 14.6 % (ref 11.5–15.5)
WBC: 9.3 K/uL (ref 4.0–10.5)
nRBC: 0 % (ref 0.0–0.2)

## 2024-07-29 LAB — HEPARIN LEVEL (UNFRACTIONATED)
Heparin Unfractionated: 0.26 [IU]/mL — ABNORMAL LOW (ref 0.30–0.70)
Heparin Unfractionated: 0.38 [IU]/mL (ref 0.30–0.70)

## 2024-07-29 MED ORDER — HEPARIN BOLUS VIA INFUSION
1000.0000 [IU] | Freq: Once | INTRAVENOUS | Status: AC
Start: 2024-07-29 — End: 2024-07-29
  Administered 2024-07-29: 1000 [IU] via INTRAVENOUS

## 2024-07-29 NOTE — Progress Notes (Addendum)
 Progress Note    07/29/2024 8:00 AM * No surgery found *  Subjective:  she says that she feels like a prisoner. She is adamant about not wanting surgery for her aneurysm    Vitals:   07/28/24 2031 07/29/24 0406  BP: (!) 89/56 117/70  Pulse:  94  Resp:  15  Temp:  98.8 F (37.1 C)  SpO2:  100%    Physical Exam: General:  sitting up in bed, eating breakfast Cardiac:  regular Lungs:  nonlabored Extremities:  left hand and arm edema unchanged  CBC    Component Value Date/Time   WBC 9.3 07/29/2024 0528   RBC 3.70 (L) 07/29/2024 0528   HGB 10.4 (L) 07/29/2024 0528   HGB 12.4 06/04/2022 1212   HGB 13.0 07/21/2017 1217   HCT 32.0 (L) 07/29/2024 0528   HCT 38.6 06/04/2022 1212   HCT 39.4 07/21/2017 1217   PLT 404 (H) 07/29/2024 0528   PLT 331 06/04/2022 1212   MCV 86.5 07/29/2024 0528   MCV 87 06/04/2022 1212   MCV 91.5 07/21/2017 1217   MCH 28.1 07/29/2024 0528   MCHC 32.5 07/29/2024 0528   RDW 14.6 07/29/2024 0528   RDW 13.5 06/04/2022 1212   RDW 13.7 07/21/2017 1217   LYMPHSABS 0.9 07/26/2024 1657   LYMPHSABS 1.4 06/04/2022 1212   LYMPHSABS 1.1 07/21/2017 1217   MONOABS 1.2 (H) 07/26/2024 1657   MONOABS 0.5 07/21/2017 1217   EOSABS 0.0 07/26/2024 1657   EOSABS 0.1 06/04/2022 1212   BASOSABS 0.0 07/26/2024 1657   BASOSABS 0.0 06/04/2022 1212   BASOSABS 0.0 07/21/2017 1217    BMET    Component Value Date/Time   NA 134 (L) 07/27/2024 0233   NA 136 06/04/2022 1212   NA 138 07/21/2017 1217   K 3.7 07/27/2024 0233   K 4.4 07/21/2017 1217   CL 97 (L) 07/27/2024 0233   CO2 21 (L) 07/27/2024 0233   CO2 26 07/21/2017 1217   GLUCOSE 100 (H) 07/27/2024 0233   GLUCOSE 121 07/21/2017 1217   BUN 8 07/27/2024 0233   BUN 11 06/04/2022 1212   BUN 12.3 07/21/2017 1217   CREATININE 0.59 07/27/2024 0233   CREATININE 0.97 07/20/2019 1342   CREATININE 1.0 07/21/2017 1217   CALCIUM 9.1 07/27/2024 0233   CALCIUM 10.2 07/21/2017 1217   GFRNONAA >60 07/27/2024 0233    GFRNONAA 56 (L) 07/20/2019 1342   GFRAA >60 02/08/2020 1103   GFRAA >60 07/20/2019 1342    INR No results found for: INR   Intake/Output Summary (Last 24 hours) at 07/29/2024 0800 Last data filed at 07/28/2024 1600 Gross per 24 hour  Intake 548.58 ml  Output --  Net 548.58 ml      Assessment/Plan:  83 y.o. female admitted with LUE DVT and 6cm ascending aneurysm   -She remains frustrated this morning. She denies any pain in her arm but says it is uncomfortable -Her left arm and hand edema is mostly unchanged  -She does not want surgery for her aortic aneurysm -Will not pursue LUE DVT thrombectomy at this time. Continue anticoagulation, compression, and arm elevation for comfort   Ahmed Holster, PA-C [facility] [phone] 07/29/2024 8:00 AM  I have independently interviewed and examined patient and agree with PA assessment and plan above.  Discussed case with the patient and her family at bedside.  Her left arm does not appear to have any measurable improvement with heparin  and with Ace wrapping yesterday.  I reiterated that  the underlying issue is the aortic aneurysm that is blocking the left innominate vein and also compressing the superior vena cava thankfully she does not have right sided symptoms.  The best treatment would be heparin  but if she does not have measurable improvement she may ultimately require thrombectomy although thrombectomy will have risks of PE and may not be effective given the ongoing obstruction.  Will continue heparin  for now.  If she needs more time she can be discharged and I can discuss with her as an outpatient if her symptoms do not improve.  Ultimately she is going to have to make a decision whether or not to have aneurysm repaired.  Kiondre Grenz C. Sheree, MD [facility] [place] Office: [phone] Pager: [phone]

## 2024-07-29 NOTE — Progress Notes (Signed)
 Express concerns about patient being on this floor as a full code patient on heparin  with an aortic aneurysm, DVT and an elevated heart rate. Dr. Chapman Dahal responded that tele med surg was appropriate care.

## 2024-07-29 NOTE — Progress Notes (Signed)
 Mobility Specialist Progress Note:    07/29/24 1107  Mobility  Activity Ambulated with assistance (In room/ to chair)  Level of Assistance Contact guard assist, steadying assist  Assistive Device Other (Comment) (IV Pole)  Distance Ambulated (ft) 30 ft  Activity Response Tolerated well  Mobility Referral Yes  Mobility visit 1 Mobility  Mobility Specialist Start Time (ACUTE ONLY) 0945  Mobility Specialist Stop Time (ACUTE ONLY) 0955  Mobility Specialist Time Calculation (min) (ACUTE ONLY) 10 min   Received pt in bed and hesitant, but agreeable to mobility. No physical assistance needed. Pt had x1 LOB, MS corrected. Attempted to ambulate in hallway, but pt c/o SOB. SPO2 was >90%. Further mobility deferred d/t this. Pt left in chair with personal belongings and call light within reach. All needs met. RN present.  Lavanda Pollack Mobility Specialist  Please contact via [employer] or  Rehab Office [phone]

## 2024-07-29 NOTE — Progress Notes (Signed)
 PHARMACY - ANTICOAGULATION CONSULT NOTE  Pharmacy Consult for heparin  Indication: VTE treatment  No Known Allergies  Patient Measurements: Height: 5' 6 (167.6 cm) Weight: 55.3 kg (121 lb 14.6 oz) IBW/kg (Calculated) : 59.3 HEPARIN  DW (KG): 55.3  Vital Signs: Temp: 98.8 F (37.1 C) (09/19 0406) Temp Source: Oral (09/19 0406) BP: 117/70 (09/19 0406) Pulse Rate: 94 (09/19 0406)  Labs: Recent Labs    07/26/24 1657 07/27/24 0233 07/27/24 0939 07/28/24 0559 07/28/24 0916 07/28/24 2117 07/29/24 0528  HGB 10.1* 10.0*  --  10.2*  --   --  10.4*  HCT 32.0* 31.8*  --  31.3*  --   --  32.0*  PLT 406* 415*  --  423*  --   --  404*  HEPARINUNFRC  --   --    < >  --  0.25* 0.21* 0.26*  CREATININE 0.74 0.59  --   --   --   --   --   CKTOTAL 15*  --   --   --   --   --   --    < > = values in this interval not displayed.    Estimated Creatinine Clearance: 46.5 mL/min (by C-G formula based on SCr of 0.59 mg/dL).   Assessment: 83 year old female history of breast cancer and known ascending aortic aneurysm, comes in with chief complaint of SOB and LUE swelling x3-4 days. CTA negative for PE, but concerning for left internal jugular and subclavian thrombus. Pharmacy consulted to dose heparin  for VTE treatment; no prior AC noted.  Heparin  level 0.26 under goal range on current rate. Confirmed with RN, no known issues or interruptions with heparin  infusion or s/sx bleeding reported.   Goal of Therapy: Heparin  level 0.3-0.7 units/ml Monitor platelets by anticoagulation protocol: Yes  Plan:  IV heparin  bolus 1000 units x1 Increase heparin  infusion to 1800 units/hr Check heparin  level in 8 hours and daily while on heparin  Continue to monitor H&H and platelets  Thank you for allowing pharmacy to be a part of this patient's care.  Shelba Collier, PharmD, BCPS Clinical Pharmacist

## 2024-07-29 NOTE — Progress Notes (Signed)
   [address] [place] [address]             [phone]   ' Sitting up in chair  BP (!) 82/57 (BP Location: Right Arm)   Pulse 88   Temp 98.5 F (36.9 C) (Oral)   Resp 16   Ht 5' 6 (1.676 m)   Wt 55.3 kg   SpO2 98%   BMI 19.68 kg/m   Has been tachycardic much of the day.  She remains adamant that she does not want to consider repair of her large ascending aneurysm.  She understands that she is at high risk of rupture and that would be fatal.  Would strongly consider discussion of resuscitation status if that has not already been done  McFall C. Kerrin, MD [facility] [phone]

## 2024-07-29 NOTE — Evaluation (Signed)
 Physical Therapy Evaluation Patient Details Name: Lindsey [name] MRN: [id number] DOB: January 18, 1941 Today's Date: 07/29/2024  History of Present Illness  83 yo F adm 07/26/24 with LUE edema and DVT of Lt subclavian and IJ with ascending aorta aneurysm. PMHx: breast CA s/p lumpectomy, temporal arteritis, HLD, HTN  Clinical Impression  Pt standing on arrival and states frustration with being in the hospital and getting weaker. Pt lives alone, is very independent and daughter lives in [place]. Pt with HR 140 with static standing with HR maintained 140-146 with ambulation. With seated rest HR down to 110 but highly fluctuant at rest from 97-130. HR greatest limiting factor with mobility as pt concerned with flight of stairs she has to manage in home but did not feel comfortable with that additional cardiac stress at this time. Pt and daughter aware of HR and that pt is moving well for strength and balance but activity tolerance highly dependent on cardiopulmonary function. Pt adamant that she does not want anyone in her home and she plans to return home. Pt with decreased activity tolerance and cardiopulmonary function who will benefit from acute therapy to maximize independence. Encouraged continued mobility with HR monitoring.         If plan is discharge home, recommend the following: Assistance with cooking/housework;A little help with bathing/dressing/bathroom   Can travel by private vehicle        Equipment Recommendations None recommended by PT  Recommendations for Other Services       Functional Status Assessment Patient has had a recent decline in their functional status and demonstrates the ability to make significant improvements in function in a reasonable and predictable amount of time.     Precautions / Restrictions Precautions Precautions: Fall;Other (comment) Recall of Precautions/Restrictions: Intact Precaution/Restrictions Comments: watch HR      Mobility  Bed Mobility                General bed mobility comments: standing on arrival and to chair end of session    Transfers Overall transfer level: Independent                      Ambulation/Gait Ambulation/Gait assistance: Supervision Gait Distance (Feet): 150 Feet Assistive device: None Gait Pattern/deviations: Step-through pattern, Decreased stride length   Gait velocity interpretation: 1.31 - 2.62 ft/sec, indicative of limited community ambulator   General Gait Details: pt with steady gait without use of AD or UB support. HR 140-146 maintained throughout gait with sPO2 100% on RA. Limited by fatigue  Stairs            Wheelchair Mobility     Tilt Bed    Modified Rankin (Stroke Patients Only)       Balance Overall balance assessment: No apparent balance deficits (not formally assessed)                                           Pertinent Vitals/Pain Pain Assessment Pain Assessment: No/denies pain    Home Living Family/patient expects to be discharged to:: Private residence Living Arrangements: Alone   Type of Home: House Home Access: Level entry     Alternate Level Stairs-Number of Steps: 11 Home Layout: Two level;Bed/bath upstairs Home Equipment: None      Prior Function Prior Level of Function : Independent/Modified Independent;Driving  Extremity/Trunk Assessment   Upper Extremity Assessment Upper Extremity Assessment: LUE deficits/detail LUE Deficits / Details: edema with decreased ROM    Lower Extremity Assessment Lower Extremity Assessment: Overall WFL for tasks assessed    Cervical / Trunk Assessment Cervical / Trunk Assessment: Normal  Communication   Communication Communication: No apparent difficulties    Cognition Arousal: Alert Behavior During Therapy: WFL for tasks assessed/performed   PT - Cognitive impairments: Memory                       PT - Cognition Comments: decreased  memory when relaying medical information and suggestions Following commands: Intact       Cueing Cueing Techniques: Verbal cues     General Comments      Exercises     Assessment/Plan    PT Assessment Patient needs continued PT services  PT Problem List Cardiopulmonary status limiting activity;Decreased activity tolerance;Decreased mobility       PT Treatment Interventions Gait training;Stair training;Functional mobility training;Patient/family education;Therapeutic activities    PT Goals (Current goals can be found in the Care Plan section)  Acute Rehab PT Goals Patient Stated Goal: return home, read and take care of myself PT Goal Formulation: With patient Time For Goal Achievement: 08/12/24 Potential to Achieve Goals: Good    Frequency Min 2X/week     Co-evaluation               AM-PAC PT 6 Clicks Mobility  Outcome Measure Help needed turning from your back to your side while in a flat bed without using bedrails?: None Help needed moving from lying on your back to sitting on the side of a flat bed without using bedrails?: None Help needed moving to and from a bed to a chair (including a wheelchair)?: None Help needed standing up from a chair using your arms (e.g., wheelchair or bedside chair)?: None Help needed to walk in hospital room?: A Little Help needed climbing 3-5 steps with a railing? : A Little 6 Click Score: 22    End of Session   Activity Tolerance: Patient tolerated treatment well Patient left: in chair;with call bell/phone within reach;with family/visitor present Nurse Communication: Mobility status PT Visit Diagnosis: Other abnormalities of gait and mobility (R26.89)    Time: 8776-8756 PT Time Calculation (min) (ACUTE ONLY): 20 min   Charges:   PT Evaluation $PT Eval Low Complexity: 1 Low   PT General Charges $$ ACUTE PT VISIT: 1 Visit         Lenoard SQUIBB, PT Acute Rehabilitation Services Office: [phone]   Lenoard NOVAK  Saurav Crumble 07/29/2024, 1:17 PM

## 2024-07-29 NOTE — Progress Notes (Signed)
 PHARMACY - ANTICOAGULATION CONSULT NOTE  Pharmacy Consult for heparin  Indication: VTE treatment  No Known Allergies  Patient Measurements: Height: 5' 6 (167.6 cm) Weight: 55.3 kg (121 lb 14.6 oz) IBW/kg (Calculated) : 59.3 HEPARIN  DW (KG): 55.3  Vital Signs: Temp: 98.5 F (36.9 C) (09/19 1426) Temp Source: Oral (09/19 1426) BP: 82/57 (09/19 1426) Pulse Rate: 88 (09/19 1426)  Labs: Recent Labs    07/27/24 0233 07/27/24 0939 07/28/24 0559 07/28/24 0916 07/28/24 2117 07/29/24 0528 07/29/24 1737  HGB 10.0*  --  10.2*  --   --  10.4*  --   HCT 31.8*  --  31.3*  --   --  32.0*  --   PLT 415*  --  423*  --   --  404*  --   HEPARINUNFRC  --    < >  --    < > 0.21* 0.26* 0.38  CREATININE 0.59  --   --   --   --   --   --    < > = values in this interval not displayed.    Estimated Creatinine Clearance: 46.5 mL/min (by C-G formula based on SCr of 0.59 mg/dL).   Assessment: 83 year old female history of breast cancer and known ascending aortic aneurysm, comes in with chief complaint of SOB and LUE swelling x3-4 days. CTA negative for PE, but concerning for left internal jugular and subclavian thrombus. Pharmacy consulted to dose heparin  for VTE treatment; no prior AC noted.  Heparin  level within therapeutic range at 0.38. Confirmed with RN, no known issues or interruptions with heparin  infusion or s/sx bleeding reported.   Goal of Therapy: Heparin  level 0.3-0.7 units/ml Monitor platelets by anticoagulation protocol: Yes  Plan: Continue IV heparin  infusion at 1800 units/hr Check heparin  level daily while on heparin  Continue to monitor H&H and platelets  Thank you for allowing pharmacy to be a part of this patient's care.  Harlene Barlow, Berdine JONETTA CORP, BCCP Clinical Pharmacist  07/29/2024 7:06 PM   [facility] pharmacy phone numbers are listed on [email or web address]

## 2024-07-29 NOTE — Care Management Important Message (Signed)
 Important Message  Patient Details  Name: [name] MRN: [id number] Date of Birth: 1941-03-12   Important Message Given:  Yes - Medicare IM     Jon Cruel 07/29/2024, 3:38 PM

## 2024-07-29 NOTE — Progress Notes (Signed)
 PROGRESS NOTE  Lindsey [name]  DOB: 10-16-1941  PCP: Joshua Debby CROME, MD FMW:[id number]  DOA: 07/26/2024  LOS: 3 days  Hospital Day: 4  Brief narrative: Lindsey [name] is a 83 y.o. female with PMH significant for left breast cancer s/p postlumpectomy, radiation, tamoxifen , h/o temporal arteritis almost treated for 10 years with prednisone .  9/16, patient presented to the ED with complaint of 4 days of progressive swelling of the left arm and discomfort.  In July, she was treated with a month-long course of prednisone  for headache.    In the ED, patient was hemodynamically stable. D-dimer was elevated CT angiogram of the chest showed -extensive DVT left subclavian, left internal jugular vein.   -6 cm aortic aneurysm without complications.  Upper extremity DVT study showed extensive DVTs of all veins involving left arm. Started on heparin  drip Admitted to [facility]  CT surgery and vascular surgery consulted   Subjective: Patient was seen and examined this morning. Sitting up in recliner.  [name] Patient has compression wrappings off since last night. Denies any physical pain but anxious, frustrated, irritated because of the new limitations and dependence to other people. Yesterday and today, she has been tachycardic and short of breath on minimal exertion. She definitely does not want surgery for aneurysm. It seems given her persistent symptoms, she seems to be getting positive towards thrombectomy at least. She is concerned that she is not can to be able to manage herself at home like this. Pending PT eval  Assessment and plan: Extensive left upper extremity DVTs Aortic aneurysm Presented with progressive left arm swelling and discomfort for 4 days. Imagings as above showing extensive DVT as well as 6 cm aortic aneurysm Started on on heparin  drip Seen by vascular surgery, CT surgery Etiology of LUE DVT-likely from the mass effect of innominate vein due to large  aortic aneurysm Aneurysm repair was offered but patient adamantly refusing it. Patient so far has refused thrombectomy as well She definitely does not want surgery for aneurysm. It seems given her persistent symptoms, she seems to be getting positive towards thrombectomy at least. She is concerned that she is not can to be able to manage herself at home like this. Pending PT eval Will continue heparin  drip for now  Sinus tachycardia Denies any physical pain but anxious, frustrated, irritated because of the new limitations and dependence to other people. Yesterday and today, she has been tachycardic and short of breath on minimal exertion. Although CTA chest on admission did not show PE, patient is at risk of PE at any time as well as the venous thrombosis is present. She might have thrown some clot and gotten a PE.  I do not think repeating the CTA to confirm would add any value as he is already on heparin  drip.  Headache H/o temporal arteritis Currently she does not have any clinical picture consistent with temporal arteritis.   As needed Tylenol    H/o left breast cancer  s/p postlumpectomy, radiation, tamoxifen    Mobility: Independent at baseline  Goals of care   Code Status: Full Code     DVT prophylaxis: Heparin  drip    Antimicrobials: None Fluid: None Consultants: Vascular surgery, cardiothoracic surgery Family Communication: [name]  Status: Inpatient Level of care:  Telemetry Medical   Patient is from: Home Needs to continue in-hospital care: Remains on heparin  drip Anticipated d/c to: Home hopefully 1 to 2 days   Diet:  Diet Order  Diet regular Room service appropriate? Yes; Fluid consistency: Thin  Diet effective now                   Scheduled Meds:    PRN meds: acetaminophen  **OR** acetaminophen    Infusions:   heparin  1,800 Units/hr (07/29/24 1000)    Antimicrobials: Anti-infectives (From admission, onward)     None       Objective: Vitals:   07/28/24 2031 07/29/24 0406  BP: (!) 89/56 117/70  Pulse:  94  Resp:  15  Temp:  98.8 F (37.1 C)  SpO2:  100%    Intake/Output Summary (Last 24 hours) at 07/29/2024 1131 Last data filed at 07/28/2024 1600 Gross per 24 hour  Intake 548.58 ml  Output --  Net 548.58 ml   Filed Weights   07/27/24 2116  Weight: 55.3 kg   Weight change:  Body mass index is 19.68 kg/m.   Physical Exam: General exam: Pleasant, elderly.  Not in pain Skin: No rashes, lesions or ulcers. HEENT: Atraumatic, normocephalic, no obvious bleeding Lungs: Clear to auscultation bilaterally,  CVS: S1, S2, no murmur,   GI/Abd: Soft, nontender, nondistended, bowel sound present,   CNS: Alert, awake, oriented x 3.  Hard of hearing Psychiatry: Mood appropriate Extremities: No pedal edema, no calf tenderness, left upper extremity grossly swollen  Data Review: I have personally reviewed the laboratory data and studies available.  F/u labs ordered Unresulted Labs (From admission, onward)     Start     Ordered   07/30/24 0500  Heparin  level (unfractionated)  Daily,   R     Question:  Specimen collection method  Answer:  Lab=Lab collect   07/28/24 2215   07/29/24 1800  Heparin  level (unfractionated)  Once-Timed,   TIMED       Question:  Specimen collection method  Answer:  Lab=Lab collect   07/29/24 0938   07/27/24 0500  CBC  Daily,   R      07/26/24 2225            Signed, Chapman Rota, MD [facility] 07/29/2024

## 2024-07-30 DIAGNOSIS — I82622 Acute embolism and thrombosis of deep veins of left upper extremity: Secondary | ICD-10-CM | POA: Diagnosis not present

## 2024-07-30 LAB — CBC
HCT: 30.1 % — ABNORMAL LOW (ref 36.0–46.0)
Hemoglobin: 9.8 g/dL — ABNORMAL LOW (ref 12.0–15.0)
MCH: 28.1 pg (ref 26.0–34.0)
MCHC: 32.6 g/dL (ref 30.0–36.0)
MCV: 86.2 fL (ref 80.0–100.0)
Platelets: 451 K/uL — ABNORMAL HIGH (ref 150–400)
RBC: 3.49 MIL/uL — ABNORMAL LOW (ref 3.87–5.11)
RDW: 14.8 % (ref 11.5–15.5)
WBC: 8.8 K/uL (ref 4.0–10.5)
nRBC: 0 % (ref 0.0–0.2)

## 2024-07-30 LAB — HEPARIN LEVEL (UNFRACTIONATED): Heparin Unfractionated: 0.42 [IU]/mL (ref 0.30–0.70)

## 2024-07-30 NOTE — Progress Notes (Signed)
 PHARMACY - ANTICOAGULATION CONSULT NOTE  Pharmacy Consult for heparin  Indication: VTE treatment  No Known Allergies  Patient Measurements: Height: 5' 6 (167.6 cm) Weight: 55.3 kg (121 lb 14.6 oz) IBW/kg (Calculated) : 59.3 HEPARIN  DW (KG): 55.3  Vital Signs: Temp: 98.2 F (36.8 C) (09/20 0428) Temp Source: Oral (09/20 0428) BP: 103/66 (09/20 0428) Pulse Rate: 108 (09/20 0428)  Labs: Recent Labs    07/28/24 0559 07/28/24 0916 07/29/24 0528 07/29/24 1737 07/30/24 0205  HGB 10.2*  --  10.4*  --  9.8*  HCT 31.3*  --  32.0*  --  30.1*  PLT 423*  --  404*  --  451*  HEPARINUNFRC  --    < > 0.26* 0.38 0.42   < > = values in this interval not displayed.    Estimated Creatinine Clearance: 46.5 mL/min (by C-G formula based on SCr of 0.59 mg/dL).   Assessment: 83 year old female history of breast cancer and known ascending aortic aneurysm, comes in with chief complaint of SOB and LUE swelling x3-4 days. CTA negative for PE, but concerning for left internal jugular and subclavian thrombus. Pharmacy consulted to dose heparin  for VTE treatment; no prior AC noted.  Heparin  level within therapeutic range at 0.42. No known issues or interruptions with heparin  infusion or s/sx bleeding reported.   Goal of Therapy: Heparin  level 0.3-0.7 units/ml Monitor platelets by anticoagulation protocol: Yes  Plan: Continue IV heparin  infusion at 1800 units/hr Check heparin  level daily while on heparin  Continue to monitor H&H and platelets  Thank you for allowing pharmacy to be a part of this patient's care.  Donny Alert, PharmD, [facility] Clinical Pharmacist Please see AMION for all Pharmacists' Contact Phone Numbers 07/30/2024, 8:01 AM     [facility] pharmacy phone numbers are listed on [email or web address]

## 2024-07-30 NOTE — Progress Notes (Signed)
 PROGRESS NOTE  Lindsey [name]  DOB: [birth year]  PCP: Joshua Debby CROME, MD FMW:[id number]  DOA: 07/26/2024  LOS: 4 days  Hospital Day: 5  Brief narrative: Lindsey [name] is a 83 y.o. female with PMH significant for left breast cancer s/p postlumpectomy, radiation, tamoxifen , h/o temporal arteritis almost treated for 10 years with prednisone .  9/16, patient presented to the ED with complaint of 4 days of progressive swelling of the left arm and discomfort.  In July, she was treated with a month-long course of prednisone  for headache.    In the ED, patient was hemodynamically stable. D-dimer was elevated CT angiogram of the chest showed -extensive DVT left subclavian, left internal jugular vein.   -6 cm aortic aneurysm without complications.  Upper extremity DVT study showed extensive DVTs of all veins involving left arm. Started on heparin  drip Admitted to [facility]  CT surgery and vascular surgery consulted   Subjective: Patient was seen and examined this morning. Propped up in bed.  Not in physical distress.  [name]. Patient feels that overall left lower extremity swelling is improving.  Bracelet  left that is loose and she can see wrinkles. Patient confirmed DNR/DNI status.  Assessment and plan: Extensive left upper extremity DVTs Aortic aneurysm Presented with progressive left arm swelling and discomfort for 4 days. Imagings as above showing extensive DVT as well as 6 cm aortic aneurysm Started on on heparin  drip Seen by vascular surgery, CT surgery Etiology of LUE DVT-likely from the mass effect of innominate vein due to large aortic aneurysm Patient has refused thrombectomy as well as aneurysm repair. Currently on heparin  drip.  It seems left upper extremity swelling is gradually improving.  I will continue heparin  drip for next 24 to 48 hours. Ultimate plan to switch to oral Eliquis /Xarelto at discharge  Sinus tachycardia Denies any physical pain but  persistently anxious, frustrated, irritated because of the new limitations and dependence to other people. For the last 2 days, she has been tachycardic in the low 100s at rest and up to 140s on ambulation. Patient states she always has baseline high heart rate but tachycardia to 140s is concerning. I have requested nursing staff to continue ambulation attempt and monitor oxygen and heart rate today as well. Pending PT eval  Mild chronic anemia Hemoglobin since admission has been running close to 10.  Continue to monitor while on heparin  drip Recent Labs    07/26/24 1657 07/27/24 0145 07/27/24 0230 07/27/24 0233 07/28/24 0559 07/29/24 0528 07/30/24 0205  HGB 10.1*  --   --  10.0* 10.2* 10.4* 9.8*  MCV 86.3  --   --  85.5 85.8 86.5 86.2  VITAMINB12  --   --  >4,000*  --   --   --   --   FOLATE  --   --   --  >20.0  --   --   --   FERRITIN  --   --  364*  --   --   --   --   TIBC  --   --  214*  --   --   --   --   IRON  --   --  18*  --   --   --   --   RETICCTPCT  --  1.3  --   --   --   --   --    Headache H/o temporal arteritis Currently she does not have any clinical picture consistent with  temporal arteritis.   As needed Tylenol    H/o left breast cancer  s/p postlumpectomy, radiation, tamoxifen    Mobility: Independent at baseline  Goals of care   Code Status: Limited: Do not attempt resuscitation (DNR) -DNR-LIMITED -Do Not Intubate/DNI   I discussed her CODE STATUS today.  Patient clearly stated that at her age, she does not want any resuscitation attempts or intubation.  She stated, ' if I have a choice, I would prefer to die on my sleep.'    DVT prophylaxis: Heparin  drip    Antimicrobials: None Fluid: None Consultants: Vascular surgery, cardiothoracic surgery Family Communication: [name]  Status: Inpatient Level of care:  Telemetry Medical   Patient is from: Home Needs to continue in-hospital care: Remains on heparin  drip Anticipated d/c to:  Home hopefully 1 to 2 days   Diet:  Diet Order             Diet regular Room service appropriate? Yes; Fluid consistency: Thin  Diet effective now                   Scheduled Meds:    PRN meds: acetaminophen  **OR** acetaminophen    Infusions:   heparin  1,800 Units/hr (07/29/24 1000)    Antimicrobials: Anti-infectives (From admission, onward)    None       Objective: Vitals:   07/30/24 0428 07/30/24 0811  BP: 103/66 104/70  Pulse: (!) 108 (!) 109  Resp: 15 18  Temp: 98.2 F (36.8 C) 98.8 F (37.1 C)  SpO2: 100% 98%    Intake/Output Summary (Last 24 hours) at 07/30/2024 1018 Last data filed at 07/29/2024 1700 Gross per 24 hour  Intake 360 ml  Output --  Net 360 ml   Filed Weights   07/27/24 2116  Weight: 55.3 kg   Weight change:  Body mass index is 19.68 kg/m.   Physical Exam: General exam: Pleasant, elderly.  Not in pain Skin: No rashes, lesions or ulcers. HEENT: Atraumatic, normocephalic, no obvious bleeding Lungs: Clear to auscultation bilaterally,  CVS: S1, S2, no murmur,   GI/Abd: Soft, nontender, nondistended, bowel sound present,   CNS: Alert, awake, oriented x 3.  Hard of hearing Psychiatry: Less anxious today Extremities: No pedal edema, no calf tenderness, left upper extremity swelling improving.  Data Review: I have personally reviewed the laboratory data and studies available.  F/u labs ordered Unresulted Labs (From admission, onward)     Start     Ordered   07/30/24 0500  Heparin  level (unfractionated)  Daily,   R     Question:  Specimen collection method  Answer:  Lab=Lab collect   07/28/24 2215   07/27/24 0500  CBC  Daily,   R      07/26/24 2225            Signed, Chapman Rota, MD [facility] 07/30/2024

## 2024-07-30 NOTE — Plan of Care (Signed)

## 2024-07-30 NOTE — Progress Notes (Signed)
 Physical Therapy Treatment Patient Details Name: Lindsey [name] MRN: [id number] DOB: 03/14/1941 Today's Date: 07/30/2024   History of Present Illness 83 yo F adm 07/26/24 with LUE edema and DVT of Lt subclavian and IJ with ascending aorta aneurysm. PMHx: breast CA s/p lumpectomy, temporal arteritis, HLD, HTN    PT Comments  Pt resting in bed on arrival, pleasant and agreeable to session. Pt demonstrating progress towards acute goals with increased cardiopulmomary endurance this session with HR 96 at rest on arrival and up to 122bpm during activity. HR continues to fluctuate at rest immediately post activity 96-136bpm. Pt demonstrating OOB mobility with grossly CGA-supervision with unilateral UE support with no overt LOB noted, however pt with guarded quality to gait with pt stating she feels like her Les are stiff. Encourage frequent short bouts of activity with seated LE exercises and up to bathroom with pt verbalizing understanding. Pt continues to benefit from skilled PT services to progress toward functional mobility goals.     If plan is discharge home, recommend the following: Assistance with cooking/housework;A little help with bathing/dressing/bathroom   Can travel by private vehicle        Equipment Recommendations  None recommended by PT    Recommendations for Other Services       Precautions / Restrictions Precautions Precautions: Fall;Other (comment) Recall of Precautions/Restrictions: Intact Precaution/Restrictions Comments: watch HR Restrictions Weight Bearing Restrictions Per Provider Order: No     Mobility  Bed Mobility Overal bed mobility: Needs Assistance Bed Mobility: Supine to Sit     Supine to sit: Contact guard     General bed mobility comments: CGA fro safety with increased time    Transfers Overall transfer level: Needs assistance Equipment used: None Transfers: Sit to/from Stand Sit to Stand: Contact guard assist           General transfer  comment: CGA for safety as pt needing increased time for hand placemet and cues to not utilize IV pole to pull up    Ambulation/Gait Ambulation/Gait assistance: Supervision Gait Distance (Feet): 85 Feet (x2 with seated rest break) Assistive device: IV Pole Gait Pattern/deviations: Step-through pattern, Decreased stride length Gait velocity: decr     General Gait Details: pt with steady gait without unilateral UE support on IV pole,  HR up to 122bpm with activity, through variable, from [employer]             Wheelchair Mobility     Tilt Bed    Modified Rankin (Stroke Patients Only)       Balance Overall balance assessment: No apparent balance deficits (not formally assessed)                                          Communication Communication Communication: No apparent difficulties  Cognition Arousal: Alert Behavior During Therapy: WFL for tasks assessed/performed   PT - Cognitive impairments: Memory                       PT - Cognition Comments: decreased memory when relaying medical information and suggestions Following commands: Intact      Cueing Cueing Techniques: Verbal cues  Exercises General Exercises - Lower Extremity Long Arc Quad: AROM, Right, Left, 20 reps, Seated Hip Flexion/Marching: AROM, Right, Left, 20 reps, Seated Toe Raises: AROM, Right, Left, 10 reps, Seated Heel Raises: AROM, Right, Left, 10 reps,  Seated    General Comments        Pertinent Vitals/Pain Pain Assessment Pain Assessment: Faces Faces Pain Scale: Hurts a little bit Pain Location: BLEs Pain Descriptors / Indicators:  (stiff) Pain Intervention(s): Monitored during session, Limited activity within patient's tolerance, Repositioned    Home Living                          Prior Function            PT Goals (current goals can now be found in the care plan section) Acute Rehab PT Goals Patient Stated Goal: return home,  read and take care of myself PT Goal Formulation: With patient Time For Goal Achievement: 08/12/24 Progress towards PT goals: Progressing toward goals    Frequency    Min 2X/week      PT Plan      Co-evaluation              AM-PAC PT 6 Clicks Mobility   Outcome Measure  Help needed turning from your back to your side while in a flat bed without using bedrails?: None Help needed moving from lying on your back to sitting on the side of a flat bed without using bedrails?: None Help needed moving to and from a bed to a chair (including a wheelchair)?: None Help needed standing up from a chair using your arms (e.g., wheelchair or bedside chair)?: None Help needed to walk in hospital room?: A Little Help needed climbing 3-5 steps with a railing? : A Little 6 Click Score: 22    End of Session   Activity Tolerance: Patient tolerated treatment well Patient left: in chair;with call bell/phone within reach;with family/visitor present Nurse Communication: Mobility status PT Visit Diagnosis: Other abnormalities of gait and mobility (R26.89)     Time: 9056-8996 PT Time Calculation (min) (ACUTE ONLY): 20 min  Charges:    $Therapeutic Exercise: 8-22 mins PT General Charges $$ ACUTE PT VISIT: 1 Visit                     Therisa R. PTA Acute Rehabilitation Services Office: [phone]   Therisa CHRISTELLA Boor 07/30/2024, 12:50 PM

## 2024-07-30 NOTE — Progress Notes (Addendum)
 Progress Note    07/30/2024 7:25 AM Hospital Day 4  Subjective:  says she can see the crease in her arm at the elbow and she can now see her knuckles.  She says the restricted bracelet is also a little looser.   Afebrile  Vitals:   07/29/24 1934 07/30/24 0428  BP: 107/68 103/66  Pulse:  (!) 108  Resp: 15 15  Temp: 98.1 F (36.7 C) 98.2 F (36.8 C)  SpO2:  100%    Physical Exam: General:  no distress Lungs:  non labored Extremities:  palpable left radial pulse  CBC    Component Value Date/Time   WBC 8.8 07/30/2024 0205   RBC 3.49 (L) 07/30/2024 0205   HGB 9.8 (L) 07/30/2024 0205   HGB 12.4 06/04/2022 1212   HGB 13.0 07/21/2017 1217   HCT 30.1 (L) 07/30/2024 0205   HCT 38.6 06/04/2022 1212   HCT 39.4 07/21/2017 1217   PLT 451 (H) 07/30/2024 0205   PLT 331 06/04/2022 1212   MCV 86.2 07/30/2024 0205   MCV 87 06/04/2022 1212   MCV 91.5 07/21/2017 1217   MCH 28.1 07/30/2024 0205   MCHC 32.6 07/30/2024 0205   RDW 14.8 07/30/2024 0205   RDW 13.5 06/04/2022 1212   RDW 13.7 07/21/2017 1217   LYMPHSABS 0.9 07/26/2024 1657   LYMPHSABS 1.4 06/04/2022 1212   LYMPHSABS 1.1 07/21/2017 1217   MONOABS 1.2 (H) 07/26/2024 1657   MONOABS 0.5 07/21/2017 1217   EOSABS 0.0 07/26/2024 1657   EOSABS 0.1 06/04/2022 1212   BASOSABS 0.0 07/26/2024 1657   BASOSABS 0.0 06/04/2022 1212   BASOSABS 0.0 07/21/2017 1217    BMET    Component Value Date/Time   NA 134 (L) 07/27/2024 0233   NA 136 06/04/2022 1212   NA 138 07/21/2017 1217   K 3.7 07/27/2024 0233   K 4.4 07/21/2017 1217   CL 97 (L) 07/27/2024 0233   CO2 21 (L) 07/27/2024 0233   CO2 26 07/21/2017 1217   GLUCOSE 100 (H) 07/27/2024 0233   GLUCOSE 121 07/21/2017 1217   BUN 8 07/27/2024 0233   BUN 11 06/04/2022 1212   BUN 12.3 07/21/2017 1217   CREATININE 0.59 07/27/2024 0233   CREATININE 0.97 07/20/2019 1342   CREATININE 1.0 07/21/2017 1217   CALCIUM 9.1 07/27/2024 0233   CALCIUM 10.2 07/21/2017 1217   GFRNONAA  >60 07/27/2024 0233   GFRNONAA 56 (L) 07/20/2019 1342   GFRAA >60 02/08/2020 1103   GFRAA >60 07/20/2019 1342    INR No results found for: INR   Intake/Output Summary (Last 24 hours) at 07/30/2024 0725 Last data filed at 07/29/2024 1700 Gross per 24 hour  Intake 360 ml  Output --  Net 360 ml     Assessment/Plan:  83 y.o. female LUE DVT and with 6cm ascending aneurysm   Hospital Day 4  -subjectively, swelling in left arm may be a little better.  -palpable left radial pulse -ace wrap off due to being uncomfortable as the day progressed -discussed continuing to elevate arm -continue heparin  gtt -agree with Dr. Kerrin resuscitation status should be discussed if not already done   Lucie Apt, PA-C [facility] [phone] 07/30/2024 7:25 AM   I have interviewed and examined patient with PA and agree with assessment and plan above.  Her left upper extremity does appear to be improving from an edema standpoint.  We again discussed attempting to not intervene on left upper extremity extensive thrombosis due to risk  of creating pulmonary embolus and inability to relieve outflow obstruction.  She will need to remain anticoagulated.  She seems adamant to avoid repair of her aneurysm and would need indefinite anticoagulation.  Byrne Capek C. Sheree, MD [facility] [place] Office: [phone] Pager: [phone]

## 2024-07-31 ENCOUNTER — Other Ambulatory Visit (HOSPITAL_COMMUNITY): Payer: Self-pay

## 2024-07-31 DIAGNOSIS — I82622 Acute embolism and thrombosis of deep veins of left upper extremity: Secondary | ICD-10-CM | POA: Diagnosis not present

## 2024-07-31 LAB — HEPARIN LEVEL (UNFRACTIONATED): Heparin Unfractionated: 0.37 [IU]/mL (ref 0.30–0.70)

## 2024-07-31 LAB — CBC
HCT: 30.1 % — ABNORMAL LOW (ref 36.0–46.0)
Hemoglobin: 9.9 g/dL — ABNORMAL LOW (ref 12.0–15.0)
MCH: 28.1 pg (ref 26.0–34.0)
MCHC: 32.9 g/dL (ref 30.0–36.0)
MCV: 85.5 fL (ref 80.0–100.0)
Platelets: 457 K/uL — ABNORMAL HIGH (ref 150–400)
RBC: 3.52 MIL/uL — ABNORMAL LOW (ref 3.87–5.11)
RDW: 14.8 % (ref 11.5–15.5)
WBC: 8.4 K/uL (ref 4.0–10.5)
nRBC: 0 % (ref 0.0–0.2)

## 2024-07-31 MED ORDER — APIXABAN (ELIQUIS) VTE STARTER PACK (10MG AND 5MG)
ORAL_TABLET | ORAL | 0 refills | Status: DC
  Filled 2024-07-31: qty 74, 30d supply, fill #0

## 2024-07-31 MED ORDER — APIXABAN 5 MG PO TABS
10.0000 mg | ORAL_TABLET | Freq: Two times a day (BID) | ORAL | Status: DC
Start: 2024-07-31 — End: 2024-07-31
  Administered 2024-07-31: 10 mg via ORAL
  Filled 2024-07-31: qty 2

## 2024-07-31 MED ORDER — APIXABAN 5 MG PO TABS: 5.0000 mg | ORAL_TABLET | Freq: Two times a day (BID) | ORAL | Status: DC

## 2024-07-31 NOTE — Discharge Instructions (Signed)
 Information on my medicine - ELIQUIS  (apixaban )  This medication education was reviewed with me or my healthcare representative as part of my discharge preparation.  T\  Why was Eliquis  prescribed for you? Eliquis  was prescribed to treat blood clots that may have been found in the veins of your legs (deep vein thrombosis) or in your lungs (pulmonary embolism) and to reduce the risk of them occurring again.  What do You need to know about Eliquis  ? The starting dose is 10 mg (two 5 mg tablets) taken TWICE daily for the FIRST SEVEN (7) DAYS, then on (enter date)  08/07/24  the dose is reduced to ONE 5 mg tablet taken TWICE daily.  Eliquis  may be taken with or without food.   Try to take the dose about the same time in the morning and in the evening. If you have difficulty swallowing the tablet whole please discuss with your pharmacist how to take the medication safely.  Take Eliquis  exactly as prescribed and DO NOT stop taking Eliquis  without talking to the doctor who prescribed the medication.  Stopping may increase your risk of developing a new blood clot.  Refill your prescription before you run out.  After discharge, you should have regular check-up appointments with your healthcare provider that is prescribing your Eliquis .    What do you do if you miss a dose? If a dose of ELIQUIS  is not taken at the scheduled time, take it as soon as possible on the same day and twice-daily administration should be resumed. The dose should not be doubled to make up for a missed dose.  Important Safety Information A possible side effect of Eliquis  is bleeding. You should call your healthcare provider right away if you experience any of the following: Bleeding from an injury or your nose that does not stop. Unusual colored urine (red or dark brown) or unusual colored stools (red or black). Unusual bruising for unknown reasons. A serious fall or if you hit your head (even if there is no  bleeding).  Some medicines may interact with Eliquis  and might increase your risk of bleeding or clotting while on Eliquis . To help avoid this, consult your healthcare provider or pharmacist prior to using any new prescription or non-prescription medications, including herbals, vitamins, non-steroidal anti-inflammatory drugs (NSAIDs) and supplements.  This website has more information on Eliquis  (apixaban ): [email or web address] .[email or web address] dena

## 2024-07-31 NOTE — Progress Notes (Addendum)
 PHARMACY - ANTICOAGULATION CONSULT NOTE  Pharmacy Consult for heparin >>Apixaban  Indication: VTE treatment  No Known Allergies  Patient Measurements: Height: 5' 6 (167.6 cm) Weight: 55.3 kg (121 lb 14.6 oz) IBW/kg (Calculated) : 59.3 HEPARIN  DW (KG): 55.3  Vital Signs: Temp: 98 F (36.7 C) (09/21 0435) Temp Source: Oral (09/21 0435) BP: 113/61 (09/21 0435) Pulse Rate: 92 (09/21 0435)  Labs: Recent Labs    07/29/24 0528 07/29/24 1737 07/30/24 0205 07/31/24 0440  HGB 10.4*  --  9.8* 9.9*  HCT 32.0*  --  30.1* 30.1*  PLT 404*  --  451* 457*  HEPARINUNFRC 0.26* 0.38 0.42 0.37    Estimated Creatinine Clearance: 46.5 mL/min (by C-G formula based on SCr of 0.59 mg/dL).   Assessment: 83 year old female history of breast cancer and known ascending aortic aneurysm, comes in with chief complaint of SOB and LUE swelling x3-4 days. CTA negative for PE, but concerning for left internal jugular and subclavian thrombus. Pharmacy consulted to dose heparin  for VTE treatment; no prior AC noted.  Heparin  level within therapeutic range at 037. No known issues or interruptions with heparin  infusion or s/sx bleeding reported. CBC stable.   Goal of Therapy: Heparin  level 0.3-0.7 units/ml Monitor platelets by anticoagulation protocol: Yes  Plan: Continue IV heparin  infusion at 1800 units/hr Check heparin  level daily while on heparin  Continue to monitor H&H and platelets  Jovany Disano A. Lyle, PharmD, BCPS, FNKF Clinical Pharmacist [place] Please utilize Amion for appropriate phone number to reach the unit pharmacist [facility])  Addendum: MD wishes to transition to DOAC and discharge today. Called [facility] pharmacy: apixaban  copay $82.43 and xarelto $142.36. [facility] has discount copay cards available that patient can use. D/w Dr. Arlice. Will start apixban now and send RX to [facility] prior to close at 1400 today. Heparin  drip to remain on until first dose of apixaban  is given.   Charl Wellen A. Lyle,  PharmD, BCPS, FNKF Clinical Pharmacist [place] Please utilize Amion for appropriate phone number to reach the unit pharmacist [facility])

## 2024-07-31 NOTE — Discharge Summary (Signed)
 Physician Discharge Summary  Lindsey [name] FMW:[id number] DOB: 09-27-1941 DOA: 07/26/2024  PCP: Joshua Debby CROME, MD  Admit date: 07/26/2024 Discharge date: 07/31/2024  Admitted from: Home Discharge disposition: Home  Recommendations at discharge:  You have been started on oral blood thinner.  Please watch out for any obvious bleeding, black stool or easy bruisability. Follow-up with with your PCP in 1 to 2 weeks to check for improvement in left upper extremity swelling as well as monitor your heart rate. Contact information up thoracic surgeon and vascular surgeon given..   Brief narrative: Lindsey [name] is a 83 y.o. female with PMH significant for left breast cancer s/p postlumpectomy, radiation, tamoxifen , h/o temporal arteritis almost treated for 10 years with prednisone .  9/16, patient presented to the ED with complaint of 4 days of progressive swelling of the left arm and discomfort.  In July, she was treated with a month-long course of prednisone  for headache.    In the ED, patient was hemodynamically stable. D-dimer was elevated CT angiogram of the chest showed -extensive DVT left subclavian, left internal jugular vein.   -6 cm aortic aneurysm without complications.  Upper extremity DVT study showed extensive DVTs of all veins involving left arm. Started on heparin  drip Admitted to [facility]  CT surgery and vascular surgery consulted   Subjective: Patient was seen and examined this morning. Lying on bed.  Not in any physical pain. But anxious, frustrated with blood clots and the need to follow-up. [name]. Yesterday, patient was able to ambulate to the end of the hallway yesterday with heart rate upto 122 which is overall better than previous few days.  Hospital course: Extensive left upper extremity DVTs Aortic aneurysm Presented with progressive left arm swelling and discomfort for 4 days. Imagings as above showed extensive DVT as well as 6 cm aortic  aneurysm Patient was started on heparin  drip Seen by vascular surgery, CT surgery Etiology of LUE DVT-likely from the mass effect of innominate vein due to large aortic aneurysm Patient has refused thrombectomy as well as aneurysm repair. Hence he was continued on heparin  drip.  In the next 72 hours with heparin  drip, left upper extremity swelling gradually started to improve. Per my discussion with family today.  Plan to switch to Eliquis  today.  Pharmacy checked insurance benefits.  Acute on chronic sinus tachycardia Patient states he always had elevated heart rate but unable to tell how high.  Reports she is not on any AV nodal blocking agent.   In the hospital, her heart rate went as high as 150s on ambulation.  There was also suspicion of PE but the initial CT scan ruled it out.   During the course of hospitalization, tachycardia on ambulation gradually improved.   Echocardiogram ruled out any evidence of tachycardia induced cardiomyopathy. Still tachycardic to 110s on ambulation but patient feels comfortable going home.  I have suggested her to follow-up with PCP as an outpatient.   Mild chronic anemia Hemoglobin since admission has been running close to 10.  Continue to monitor while on heparin  drip Recent Labs    07/27/24 0145 07/27/24 0230 07/27/24 0233 07/28/24 0559 07/29/24 0528 07/30/24 0205 07/31/24 0440  HGB  --   --  10.0* 10.2* 10.4* 9.8* 9.9*  MCV  --   --  85.5 85.8 86.5 86.2 85.5  VITAMINB12  --  >4,000*  --   --   --   --   --   FOLATE  --   --  >  20.0  --   --   --   --   FERRITIN  --  364*  --   --   --   --   --   TIBC  --  214*  --   --   --   --   --   IRON  --  18*  --   --   --   --   --   RETICCTPCT 1.3  --   --   --   --   --   --    Headache H/o temporal arteritis Currently she does not have any clinical picture consistent with temporal arteritis.   As needed Tylenol    H/o left breast cancer  s/p postlumpectomy, radiation,  tamoxifen    Mobility: Independent at baseline.  Able to ambulate without assistive device  Goals of care   Code Status: Limited: Do not attempt resuscitation (DNR) -DNR-LIMITED -Do Not Intubate/DNI    Diet:  Diet Order             Diet general           Diet general           Diet regular Room service appropriate? Yes; Fluid consistency: Thin  Diet effective now                   Nutritional status:  Body mass index is 19.68 kg/m.       Wounds: -    Discharge Exam:   Vitals:   07/30/24 1457 07/30/24 2044 07/31/24 0435 07/31/24 0802  BP: 108/70 125/80 113/61 (!) 94/59  Pulse: 92 97 92 (!) 106  Resp: 18 15 15 18   Temp: 98.6 F (37 C) 97.7 F (36.5 C) 98 F (36.7 C) 98.5 F (36.9 C)  TempSrc: Oral Oral Oral Oral  SpO2: 100% 100% 91% 99%  Weight:      Height:        Body mass index is 19.68 kg/m.  General exam: Pleasant, elderly.  Not in pain Skin: No rashes, lesions or ulcers. HEENT: Atraumatic, normocephalic, no obvious bleeding Lungs: Clear to auscultation bilaterally,  CVS: Remains slightly tachycardic, no murmur,   GI/Abd: Soft, nontender, nondistended, bowel sound present,   CNS: Alert, awake, oriented x 3.  Hard of hearing Psychiatry: Seem to have some degree of anxiety/frustration all the time Extremities: No pedal edema, no calf tenderness, left upper extremity swelling gradually improving.  Follow ups:    Follow-up Information     Joshua Debby CROME, MD Follow up.   Specialty: Internal Medicine Contact information: [address] [place] [address] [phone]         Sheree Penne Bruckner, MD Follow up.   Specialties: Vascular Surgery, Cardiology Contact information: [address] [place] [address] [phone]         Kerrin Elspeth BROCKS, MD Follow up.   Specialty: Cardiothoracic Surgery Contact information: [address] [place] [address] [phone]                  Discharge Instructions:   Discharge Instructions     Call MD for:  difficulty breathing, headache or visual disturbances   Complete by: As directed    Call MD for:  difficulty breathing, headache or visual disturbances   Complete by: As directed    Call MD for:  extreme fatigue   Complete by: As directed    Call MD for:  extreme fatigue   Complete  by: As directed    Call MD for:  hives   Complete by: As directed    Call MD for:  hives   Complete by: As directed    Call MD for:  persistant dizziness or light-headedness   Complete by: As directed    Call MD for:  persistant dizziness or light-headedness   Complete by: As directed    Call MD for:  persistant nausea and vomiting   Complete by: As directed    Call MD for:  persistant nausea and vomiting   Complete by: As directed    Call MD for:  severe uncontrolled pain   Complete by: As directed    Call MD for:  severe uncontrolled pain   Complete by: As directed    Call MD for:  temperature >100.4   Complete by: As directed    Call MD for:  temperature >100.4   Complete by: As directed    Diet general   Complete by: As directed    Diet general   Complete by: As directed    Discharge instructions   Complete by: As directed    Recommendations at discharge:   You have been started on oral blood thinner.  Please watch out for any obvious bleeding, black stool or easy bruisability.  Follow-up with with your PCP in 1 to 2 weeks to check for improvement in left upper extremity swelling as well as monitor your heart rate.  Contact information up thoracic surgeon and vascular surgeon given..   You have been started on a blood thinner.  Please watch out for any obvious bleeding, black stool or easy bruisability.  Please contact your primary care provider for further recommendation.    General discharge instructions: Follow with Primary MD Joshua Debby CROME, MD in 7 days  Please request your PCP  to go over your hospital  tests, procedures, radiology results at the follow up. Please get your medicines reviewed and adjusted.  Your PCP may decide to repeat certain labs or tests as needed. Do not drive, operate heavy machinery, perform activities at heights, swimming or participation in water activities or provide baby sitting services if your were admitted for syncope or siezures until you have seen by Primary MD or a Neurologist and advised to do so again. [place]  Controlled Substance Reporting System database was reviewed. Do not drive, operate heavy machinery, perform activities at heights, swim, participate in water activities or provide baby-sitting services while on medications for pain, sleep and mood until your outpatient physician has reevaluated you and advised to do so again.  You are strongly recommended to comply with the dose, frequency and duration of prescribed medications. Activity: As tolerated with Full fall precautions use walker/cane & assistance as needed Avoid using any recreational substances like cigarette, tobacco, alcohol, or non-prescribed drug. If you experience worsening of your admission symptoms, develop shortness of breath, life threatening emergency, suicidal or homicidal thoughts you must seek medical attention immediately by calling 911 or calling your MD immediately  if symptoms less severe. You must read complete instructions/literature along with all the possible adverse reactions/side effects for all the medicines you take and that have been prescribed to you. Take any new medicine only after you have completely understood and accepted all the possible adverse reactions/side effects.  Wear Seat belts while driving. You were cared for by a hospitalist during your hospital stay. If you have any questions about your discharge medications or the care you received while you  were in the hospital after you are discharged, you can call the unit and ask to speak with the hospitalist or the  covering physician. Once you are discharged, your primary care physician will handle any further medical issues. Please note that NO REFILLS for any discharge medications will be authorized once you are discharged, as it is imperative that you return to your primary care physician (or establish a relationship with a primary care physician if you do not have one).   Discharge instructions   Complete by: As directed    Recommendations at discharge:   You have been started on oral blood thinner.  Please watch out for any obvious bleeding, black stool or easy bruisability.  Follow-up with with your PCP in 1 to 2 weeks to check for improvement in left upper extremity swelling as well as monitor your heart rate.  Contact information up thoracic surgeon and vascular surgeon given..   You have been started on a blood thinner.  Please watch out for any obvious bleeding, black stool or easy bruisability.  Please contact your primary care provider for further recommendation.    General discharge instructions: Follow with Primary MD Joshua Debby CROME, MD in 7 days  Please request your PCP  to go over your hospital tests, procedures, radiology results at the follow up. Please get your medicines reviewed and adjusted.  Your PCP may decide to repeat certain labs or tests as needed. Do not drive, operate heavy machinery, perform activities at heights, swimming or participation in water activities or provide baby sitting services if your were admitted for syncope or siezures until you have seen by Primary MD or a Neurologist and advised to do so again.   Controlled Substance Reporting System database was reviewed. Do not drive, operate heavy machinery, perform activities at heights, swim, participate in water activities or provide baby-sitting services while on medications for pain, sleep and mood until your outpatient physician has reevaluated you and advised to do so again.  You are strongly  recommended to comply with the dose, frequency and duration of prescribed medications. Activity: As tolerated with Full fall precautions use walker/cane & assistance as needed Avoid using any recreational substances like cigarette, tobacco, alcohol, or non-prescribed drug. If you experience worsening of your admission symptoms, develop shortness of breath, life threatening emergency, suicidal or homicidal thoughts you must seek medical attention immediately by calling 911 or calling your MD immediately  if symptoms less severe. You must read complete instructions/literature along with all the possible adverse reactions/side effects for all the medicines you take and that have been prescribed to you. Take any new medicine only after you have completely understood and accepted all the possible adverse reactions/side effects.  Wear Seat belts while driving. You were cared for by a hospitalist during your hospital stay. If you have any questions about your discharge medications or the care you received while you were in the hospital after you are discharged, you can call the unit and ask to speak with the hospitalist or the covering physician. Once you are discharged, your primary care physician will handle any further medical issues. Please note that NO REFILLS for any discharge medications will be authorized once you are discharged, as it is imperative that you return to your primary care physician (or establish a relationship with a primary care physician if you do not have one).   Increase activity slowly   Complete by: As directed    Increase activity slowly   Complete by:  As directed        Discharge Medications:   Allergies as of 07/31/2024   No Known Allergies      Medication List     TAKE these medications    Apixaban  Starter Pack (10mg  and 5mg ) Commonly known as: ELIQUIS  STARTER PACK Take as directed on package: start with two-5mg  tablets twice daily for 7 days. On day 8, switch to  one-5mg  tablet twice daily.   ascorbic acid 500 MG tablet Commonly known as: VITAMIN C Take 500 mg by mouth 3 (three) times a week.   calcium carbonate 1500 (600 Ca) MG Tabs tablet Commonly known as: OSCAL Take 1,500 mg by mouth 3 (three) times a week.   cholecalciferol 25 MCG (1000 UNIT) tablet Commonly known as: VITAMIN D3 Take 1,000 Units by mouth 3 (three) times a week.   loperamide  2 MG capsule Commonly known as: IMODIUM  Take 1 capsule (2 mg total) by mouth 4 (four) times daily as needed for diarrhea or loose stools.   Vitamin B Complex Tabs Take 1 tablet by mouth 3 (three) times a week.         The results of significant diagnostics from this hospitalization (including imaging, microbiology, ancillary and laboratory) are listed below for reference.    Procedures and Diagnostic Studies:   VAS US  UPPER EXTREMITY VENOUS DUPLEX Result Date: 07/27/2024 UPPER VENOUS STUDY  Patient Name:  KIONA BLUME  Date of Exam:   07/27/2024 Medical Rec #: [id number]        Accession #:    [id number] Date of Birth: [birth year]       Patient Gender: F Patient Age:   40 years Exam Location:  [facility] Procedure:      VAS US  UPPER EXTREMITY VENOUS DUPLEX Referring Phys: REDIA CLEAVER --------------------------------------------------------------------------------  Indications: Edema Risk Factors: None identified. Comparison       07/27/2024 - CT Angio Chest PE W and/or Wo Contrast Study:                  Changes highly suggestive of deep venous thrombosis within the                  left                  internal jugular vein as well as the left subclavian vein.                  This                  would correspond with the patient's given clinical history of                  left                  arm swelling. Duplex ultrasound is recommended for further                  evaluation. Performing Technologist: Cordella Collet RVT  Examination Guidelines: A complete evaluation includes  B-mode imaging, spectral Doppler, color Doppler, and power Doppler as needed of all accessible portions of each vessel. Bilateral testing is considered an integral part of a complete examination. Limited examinations for reoccurring indications may be performed as noted.  Right Findings: +----------+------------+---------+-----------+----------+-------+ RIGHT     CompressiblePhasicitySpontaneousPropertiesSummary +----------+------------+---------+-----------+----------+-------+ Subclavian               Yes       Yes                      +----------+------------+---------+-----------+----------+-------+  Left Findings: +----------+------------+---------+-----------+----------+-------+ LEFT      CompressiblePhasicitySpontaneousPropertiesSummary +----------+------------+---------+-----------+----------+-------+ IJV           None       No        No                Acute  +----------+------------+---------+-----------+----------+-------+ Subclavian               No        No                Acute  +----------+------------+---------+-----------+----------+-------+ Axillary      None       No        No                Acute  +----------+------------+---------+-----------+----------+-------+ Brachial      None       No        No                Acute  +----------+------------+---------+-----------+----------+-------+ Radial        Full                                          +----------+------------+---------+-----------+----------+-------+ Ulnar         Full                                          +----------+------------+---------+-----------+----------+-------+ Cephalic    Partial                                  Acute  +----------+------------+---------+-----------+----------+-------+ Basilic       Full                                          +----------+------------+---------+-----------+----------+-------+  Summary:  Right: No evidence of  thrombosis in the subclavian.  Left: Findings consistent with acute deep vein thrombosis involving the left internal jugular vein, left subclavian vein, left axillary vein and left brachial veins. Findings consistent with acute superficial vein thrombosis involving the left cephalic vein.  *See table(s) above for measurements and observations.  Diagnosing physician: Fonda Rim Electronically signed by Fonda Rim on 07/27/2024 at 2:48:33 PM.    Final    CT HEAD WO CONTRAST ( ) Result Date: 07/27/2024 CLINICAL DATA:  Headache, new onset (Age >= 51y) EXAM: CT HEAD WITHOUT CONTRAST TECHNIQUE: Contiguous axial images were obtained from the base of the skull through the vertex without intravenous contrast. RADIATION DOSE REDUCTION: This exam was performed according to the departmental dose-optimization program which includes automated exposure control, adjustment of the mA and/or kV according to patient size and/or use of iterative reconstruction technique. COMPARISON:  None Available. FINDINGS: Brain: Diffuse brain atrophy pattern and chronic white matter microvascular ischemic changes throughout both cerebral hemispheres. No acute intracranial hemorrhage, mass lesion, infarction, midline shift, herniation, hydrocephalus, or extra-axial fluid collection. No focal mass effect or edema. Cisterns are patent. Cerebellar atrophy as well. Vascular: No hyperdense vessel or unexpected calcification. Skull: Normal. Negative for fracture or focal lesion. Sinuses/Orbits: No acute finding. Other: None. IMPRESSION: 1. Brain atrophy and  chronic white matter microvascular ischemic changes. 2. No acute intracranial abnormality by noncontrast CT. Electronically Signed   By: CHRISTELLA.  Shick M.D.   On: 07/27/2024 09:59   CT Angio Chest PE W and/or Wo Contrast Result Date: 07/26/2024 CLINICAL DATA:  Chest pain with left arm swelling, initial encounter EXAM: CT ANGIOGRAPHY CHEST WITH CONTRAST TECHNIQUE: Multidetector CT imaging of the  chest was performed using the standard protocol during bolus administration of intravenous contrast. Multiplanar CT image reconstructions and MIPs were obtained to evaluate the vascular anatomy. RADIATION DOSE REDUCTION: This exam was performed according to the departmental dose-optimization program which includes automated exposure control, adjustment of the mA and/or kV according to patient size and/or use of iterative reconstruction technique. CONTRAST:  75mL OMNIPAQUE  IOHEXOL  350 MG/ML SOLN COMPARISON:  None Available. FINDINGS: Cardiovascular: Thoracic aorta and its branches are well visualize without evidence of dissection. Aneurysmal dilatation of the ascending aorta is noted to 6 cm. Normal tapering in the aortic arch is noted. The descending aorta demonstrates atherosclerotic calcification without complicating factors. The pulmonary artery shows a normal branching pattern bilaterally. No filling defect to suggest pulmonary embolism is noted. In the left neck there is considerable inflammatory change surrounding the left internal jugular vein which appears enlarged in size. This raises suspicion for underlying deep venous thrombosis. Similar findings are seen in the left shoulder involving the subclavian vein. Mediastinum/Nodes: Thoracic inlet is otherwise within normal limits. No hilar or mediastinal adenopathy is noted. The esophagus as visualized is within normal limits. Lungs/Pleura: Lungs are well aerated bilaterally. No focal infiltrate or sizable parenchymal nodule is seen. No effusions are noted. Mild scarring is noted within the right middle lobe. Upper Abdomen: No acute abnormality. Musculoskeletal: No acute bony abnormality is noted. Review of the MIP images confirms the above findings. IMPRESSION: Changes highly suggestive of deep venous thrombosis within the left internal jugular vein as well as the left subclavian vein. This would correspond with the patient's given clinical history of left arm  swelling. Duplex ultrasound is recommended for further evaluation. No evidence of pulmonary emboli. Dilatation of the ascending aorta to 6 cm. Recommend semi-annual imaging followup by CTA or MRA and referral to cardiothoracic surgery if not already obtained. This recommendation follows 2010 ACCF/AHA/AATS/ACR/ASA/SCA/SCAI/SIR/STS/SVM Guidelines for the Diagnosis and Management of Patients With Thoracic Aortic Disease. Circulation. 2010; 121: E266-e369TAA. Aortic aneurysm NOS (ICD10-I71.9) Right middle lobe scarring. Electronically Signed   By: Oneil Devonshire M.D.   On: 07/26/2024 21:13   DG Chest Port 1 View Result Date: 07/26/2024 CLINICAL DATA:  shob EXAM: PORTABLE CHEST 1 VIEW COMPARISON:  Chest x-ray 12/26/2013 FINDINGS: The heart and mediastinal contours are grossly unremarkable in the setting of patient leaning forward. No focal consolidation. No pulmonary edema. No pleural effusion. No pneumothorax. No acute osseous abnormality. IMPRESSION: No acute cardiopulmonary disease. Slightly limited evaluation due to patient positioning. Consider repeat chest x-ray PA and lateral view for further evaluation. Electronically Signed   By: Morgane  Naveau M.D.   On: 07/26/2024 19:44     Labs:   Basic Metabolic Panel: Recent Labs  Lab 07/26/24 1657 07/27/24 0233  NA 131* 134*  K 4.2 3.7  CL 95* 97*  CO2 21* 21*  GLUCOSE 109* 100*  BUN 11 8  CREATININE 0.74 0.59  CALCIUM 9.1 9.1   GFR Estimated Creatinine Clearance: 46.5 mL/min (by C-G formula based on SCr of 0.59 mg/dL). Liver Function Tests: Recent Labs  Lab 07/27/24 0233  AST 24  ALT 9  ALKPHOS  110  BILITOT 0.8  PROT 5.7*  ALBUMIN 3.2*   No results for input(s): LIPASE, AMYLASE in the last 168 hours. No results for input(s): AMMONIA in the last 168 hours. Coagulation profile No results for input(s): INR, PROTIME in the last 168 hours.  CBC: Recent Labs  Lab 07/26/24 1657 07/27/24 0233 07/28/24 0559 07/29/24 0528  07/30/24 0205 07/31/24 0440  WBC 10.5 9.0 9.8 9.3 8.8 8.4  NEUTROABS 8.2*  --   --   --   --   --   HGB 10.1* 10.0* 10.2* 10.4* 9.8* 9.9*  HCT 32.0* 31.8* 31.3* 32.0* 30.1* 30.1*  MCV 86.3 85.5 85.8 86.5 86.2 85.5  PLT 406* 415* 423* 404* 451* 457*   Cardiac Enzymes: Recent Labs  Lab 07/26/24 1657  CKTOTAL 15*   BNP: Invalid input(s): POCBNP CBG: No results for input(s): GLUCAP in the last 168 hours. D-Dimer No results for input(s): DDIMER in the last 72 hours. Hgb A1c No results for input(s): HGBA1C in the last 72 hours. Lipid Profile No results for input(s): CHOL, HDL, LDLCALC, TRIG, CHOLHDL, LDLDIRECT in the last 72 hours. Thyroid  function studies No results for input(s): TSH, T4TOTAL, T3FREE, THYROIDAB in the last 72 hours.  Invalid input(s): FREET3 Anemia work up No results for input(s): VITAMINB12, FOLATE, FERRITIN, TIBC, IRON, RETICCTPCT in the last 72 hours. Microbiology No results found for this or any previous visit (from the past 240 hours).  Time coordinating discharge: 45 minutes  Signed: Nija Koopman  [facility] Hospitalists 07/31/2024, 10:50 AM

## 2024-07-31 NOTE — Plan of Care (Signed)

## 2024-07-31 NOTE — Plan of Care (Signed)
  Problem: Education: Goal: Knowledge of General Education information will improve Description: Including pain rating scale, medication(s)/side effects and non-pharmacologic comfort measures 07/31/2024 1102 by Rosalynn Warren DASEN, RN Outcome: Completed/Met 07/31/2024 1102 by Rosalynn Warren DASEN, RN Outcome: Progressing   Problem: Health Behavior/Discharge Planning: Goal: Ability to manage health-related needs will improve 07/31/2024 1102 by Rosalynn Warren DASEN, RN Outcome: Completed/Met 07/31/2024 1102 by Rosalynn Warren DASEN, RN Outcome: Progressing   Problem: Clinical Measurements: Goal: Ability to maintain clinical measurements within normal limits will improve 07/31/2024 1102 by Rosalynn Warren DASEN, RN Outcome: Completed/Met 07/31/2024 1102 by Rosalynn Warren DASEN, RN Outcome: Progressing Goal: Will remain free from infection 07/31/2024 1102 by Rosalynn Warren DASEN, RN Outcome: Completed/Met 07/31/2024 1102 by Rosalynn Warren DASEN, RN Outcome: Progressing Goal: Diagnostic test results will improve 07/31/2024 1102 by Rosalynn Warren DASEN, RN Outcome: Completed/Met 07/31/2024 1102 by Rosalynn Warren DASEN, RN Outcome: Progressing Goal: Respiratory complications will improve 07/31/2024 1102 by Rosalynn Warren DASEN, RN Outcome: Completed/Met 07/31/2024 1102 by Rosalynn Warren DASEN, RN Outcome: Progressing Goal: Cardiovascular complication will be avoided 07/31/2024 1102 by Rosalynn Warren DASEN, RN Outcome: Completed/Met 07/31/2024 1102 by Rosalynn Warren DASEN, RN Outcome: Progressing   Problem: Activity: Goal: Risk for activity intolerance will decrease 07/31/2024 1102 by Rosalynn Warren DASEN, RN Outcome: Completed/Met 07/31/2024 1102 by Rosalynn Warren DASEN, RN Outcome: Progressing   Problem: Nutrition: Goal: Adequate nutrition will be maintained 07/31/2024 1102 by Rosalynn Warren DASEN, RN Outcome: Completed/Met 07/31/2024 1102 by Rosalynn Warren DASEN, RN Outcome: Progressing   Problem: Coping: Goal: Level of anxiety will decrease 07/31/2024  1102 by Rosalynn Warren DASEN, RN Outcome: Completed/Met 07/31/2024 1102 by Rosalynn Warren DASEN, RN Outcome: Progressing   Problem: Elimination: Goal: Will not experience complications related to bowel motility 07/31/2024 1102 by Rosalynn Warren DASEN, RN Outcome: Completed/Met 07/31/2024 1102 by Rosalynn Warren DASEN, RN Outcome: Progressing Goal: Will not experience complications related to urinary retention 07/31/2024 1102 by Rosalynn Warren DASEN, RN Outcome: Completed/Met 07/31/2024 1102 by Rosalynn Warren DASEN, RN Outcome: Progressing   Problem: Pain Managment: Goal: General experience of comfort will improve and/or be controlled 07/31/2024 1102 by Rosalynn Warren DASEN, RN Outcome: Completed/Met 07/31/2024 1102 by Rosalynn Warren DASEN, RN Outcome: Progressing   Problem: Safety: Goal: Ability to remain free from injury will improve 07/31/2024 1102 by Rosalynn Warren DASEN, RN Outcome: Completed/Met 07/31/2024 1102 by Rosalynn Warren DASEN, RN Outcome: Progressing   Problem: Skin Integrity: Goal: Risk for impaired skin integrity will decrease 07/31/2024 1102 by Rosalynn Warren DASEN, RN Outcome: Completed/Met 07/31/2024 1102 by Rosalynn Warren DASEN, RN Outcome: Progressing

## 2024-08-01 ENCOUNTER — Telehealth: Payer: Self-pay

## 2024-08-01 ENCOUNTER — Telehealth: Payer: Self-pay | Admitting: Vascular Surgery

## 2024-08-01 NOTE — Transitions of Care (Post Inpatient/ED Visit) (Signed)
   08/01/2024  Name: Lindsey [name] MRN: [id number] DOB: 05/15/1941  Today's TOC FU Call Status: Today's TOC FU Call Status:: Successful TOC FU Call Completed TOC FU Call Complete Date: 08/01/24 Patient's Name and Date of Birth confirmed.  Transition Care Management Follow-up Telephone Call Date of Discharge: 07/31/24 Discharge Facility: [facility]) Type of Discharge: Inpatient Admission Primary Inpatient Discharge Diagnosis:: DVT Left Upper Extremity How have you been since you were released from the hospital?: Better Any questions or concerns?: No  Items Reviewed: Did you receive and understand the discharge instructions provided?: Yes Medications obtained,verified, and reconciled?: Yes (Medications Reviewed) Any new allergies since your discharge?: No Dietary orders reviewed?: NA Do you have support at home?: Yes People in Home [RPT]: child(ren), adult  Medications Reviewed Today: Medications Reviewed Today     Reviewed by Lavelle Charmaine NOVAK, LPN (Licensed Practical Nurse) on 08/01/24 at 1203  Med List Status: <None>   Medication Order Taking? Sig Documenting Provider Last Dose Status Informant  APIXABAN  (ELIQUIS ) VTE STARTER PACK (10MG  AND 5MG ) [id number] Yes Take as directed on package: start with two-5mg  tablets twice daily for 7 days. On day 8, switch to one-5mg  tablet twice daily. Arlice Reichert, MD  Active   ascorbic acid (VITAMIN C) 500 MG tablet [id number] Yes Take 500 mg by mouth 3 (three) times a week. [provider]  Active Self  B Complex Vitamins (VITAMIN B COMPLEX) TABS [id number] Yes Take 1 tablet by mouth 3 (three) times a week. [provider]  Active Self  calcium carbonate (OSCAL) 1500 (600 Ca) MG TABS tablet [id number] Yes Take 1,500 mg by mouth 3 (three) times a week. [provider]  Active Self  cholecalciferol (VITAMIN D3) 25 MCG (1000 UNIT) tablet [id number] Yes Take 1,000 Units by mouth 3 (three) times a week. [provider]  Active Self  loperamide  (IMODIUM ) 2 MG capsule [id number]  Take 1 capsule (2 mg total) by mouth 4 (four) times daily as needed for diarrhea or loose stools.  Patient not taking: Reported on 08/01/2024   Kingsley, Victoria K, DO  Active Self            Home Care and Equipment/Supplies: Were Home Health Services Ordered?: NA Any new equipment or medical supplies ordered?: NA  Functional Questionnaire: Do you need assistance with bathing/showering or dressing?: Yes Do you need assistance with meal preparation?: Yes Do you need assistance with eating?: No Do you have difficulty maintaining continence: No Do you need assistance with getting out of bed/getting out of a chair/moving?: No Do you have difficulty managing or taking your medications?: Yes  Follow up appointments reviewed: PCP Follow-up appointment confirmed?: Yes Date of PCP follow-up appointment?: 08/05/24 Follow-up Provider: Dr. Glade Hope Specialist [facility] Follow-up appointment confirmed?: Yes Follow-Up Specialty Provider:: Vascular and Thoracic Surgeon Do you need transportation to your follow-up appointment?: No Do you understand care options if your condition(s) worsen?: Yes-patient verbalized understanding    SIGNATURE Charmaine Lavelle, LPN [facility] Advisor [facility] You Are. We Are. [facility] Direct Dial [phone]

## 2024-08-04 ENCOUNTER — Ambulatory Visit: Admission: EM | Admit: 2024-08-04 | Discharge: 2024-08-04 | Disposition: A | Discharge status: Home / Self Care

## 2024-08-04 ENCOUNTER — Encounter: Payer: Self-pay | Admitting: Emergency Medicine

## 2024-08-04 ENCOUNTER — Encounter: Payer: Self-pay | Admitting: Internal Medicine

## 2024-08-04 DIAGNOSIS — Z86718 Personal history of other venous thrombosis and embolism: Secondary | ICD-10-CM | POA: Diagnosis not present

## 2024-08-04 DIAGNOSIS — I82622 Acute embolism and thrombosis of deep veins of left upper extremity: Secondary | ICD-10-CM

## 2024-08-04 DIAGNOSIS — M7989 Other specified soft tissue disorders: Secondary | ICD-10-CM | POA: Diagnosis not present

## 2024-08-04 DIAGNOSIS — R000 Tachycardia, unspecified: Secondary | ICD-10-CM | POA: Insufficient documentation

## 2024-08-04 DIAGNOSIS — S41112A Laceration without foreign body of left upper arm, initial encounter: Secondary | ICD-10-CM

## 2024-08-04 DIAGNOSIS — Z7901 Long term (current) use of anticoagulants: Secondary | ICD-10-CM

## 2024-08-04 NOTE — ED Triage Notes (Addendum)
 Pt reports she was informed she has a large blood clot in left side of her neck which has caused major swelling in left arm. Pt states,  I have just gotten out of the hospital on Sunday and they put me on Eliquis . I have been taking the drug and this morning I bumped into the light switch on the wall and now I have a small incision the size of a dime that won't stop draining some kind of clear fluid. I tried gauze, bandaid, and kleenex. Nothing is working. Now I have this sticky wrap here that stopped the leaking but is cutting off my circulation. I need help to stop the leaking.  Pt's daughter states the arm has swollen even more since applying the bandage to stop the leaking.

## 2024-08-04 NOTE — ED Provider Notes (Signed)
 [facility]    CSN: [id number] Arrival date & time: 08/04/24  1204      History   Chief Complaint Chief Complaint  Patient presents with   Wound Check    HPI Lindsey [name] is a 83 y.o. female.   Discussed the use of AI scribe software for clinical note transcription with the patient, who gave verbal consent to proceed.   History provided by patient and her daughter  The patient presents for evaluation following a recent hospital discharge four days ago. She was admitted for extensive left upper extremity DVTs, a 6 cm aortic aneurysm, and acute on chronic sinus tachycardia. During her hospitalization, she was evaluated by vascular surgery and cardiothoracic surgery. The DVTs were thought to be secondary to mass effect from the large aneurysm. The patient declined both thrombectomy and aneurysm repair. She was treated with a heparin  drip while inpatient and discharged on Eliquis .  Currently, she denies pain in the arm but describes a burning sensation at the site where her daughter applied a pressure dressing, along with occasional numbness in her fingers. She believes the dressing is too tight and has noticed increased swelling of the arm since it was applied. She has a follow-up appointment scheduled tomorrow with Dr. Geofm at [place], [place] at 11 a.m. for ongoing management.  Patient  The following sections of the patient's history were reviewed and updated as appropriate: allergies, current medications, past family history, past medical history, past social history, past surgical history, and problem list.        Past Medical History:  Diagnosis Date   Breast cancer (HCC) 01/03/14   left, 12 o'clock, upper outer   Radiation 03/09/14-03/31/14   Left Breast    Temporal arteritis (HCC)     Patient Active Problem List   Diagnosis Date Noted   Anemia 07/27/2024   Hyponatremia 07/27/2024   DVT (deep venous thrombosis) (HCC) 07/26/2024   Ascending aortic  aneurysm 07/26/2024   Encounter for general adult medical examination with abnormal findings 07/21/2023   Bilateral hearing loss 05/15/2022   Bunion, right foot 05/15/2022   DOE (dyspnea on exertion) 10/31/2021   Essential hypertension 01/31/2016   Estrogen deficiency 01/31/2016   PMR (polymyalgia rheumatica) 08/28/2015   Hyperglycemia 03/02/2014   Hyperlipidemia with target LDL less than 160 03/02/2014   Routine general medical examination at a health care facility 03/02/2014   Malignant neoplasm of upper-outer quadrant of left breast in female, estrogen receptor positive (HCC) 02/13/2014   Temporal arteritis (HCC) 12/26/2013    Past Surgical History:  Procedure Laterality Date   ARTERY BIOPSY Right 12/27/2013   Procedure: BIOPSY TEMPORAL ARTERY;  Surgeon: Camellia CHRISTELLA Blush, MD;  Location: [facility];  Service: General;  Laterality: Right;   BREAST SURGERY  01/03/14   breast biopsy   TUBAL LIGATION     many years ago    OB History   No obstetric history on file.      Home Medications    Prior to Admission medications   Medication Sig Start Date End Date Taking? Authorizing Provider  APIXABAN  (ELIQUIS ) VTE STARTER PACK (10MG  AND 5MG ) Take as directed on package: start with two-5mg  tablets twice daily for 7 days. On day 8, switch to one-5mg  tablet twice daily. 07/31/24  Yes Dahal, Chapman, MD  predniSONE  (DELTASONE ) 20 MG tablet Take 60 mg by mouth daily. 04/29/24  Yes [provider]  ascorbic acid (VITAMIN C) 500 MG tablet Take 500 mg by mouth 3 (  three) times a week.    [provider]  B Complex Vitamins (VITAMIN B COMPLEX) TABS Take 1 tablet by mouth 3 (three) times a week.    [provider]  calcium carbonate (OSCAL) 1500 (600 Ca) MG TABS tablet Take 1,500 mg by mouth 3 (three) times a week.    [provider]  cholecalciferol (VITAMIN D3) 25 MCG (1000 UNIT) tablet Take 1,000 Units by mouth 3 (three) times a week.    [provider]   colestipol (COLESTID) 1 g tablet 2 tablets at night Orally Once a day; Duration: 30 days    [provider]  loperamide  (IMODIUM ) 2 MG capsule Take 1 capsule (2 mg total) by mouth 4 (four) times daily as needed for diarrhea or loose stools. Patient not taking: Reported on 08/01/2024 08/02/22   Kingsley, Victoria K, DO  Psyllium (METAMUCIL) 0.36 g CAPS as directed Orally    [provider]    Family History Family History  Problem Relation Age of Onset   Diabetes Paternal Uncle    Alzheimer's disease Mother     Social History Social History   Tobacco Use   Smoking status: Never    Passive exposure: Never   Smokeless tobacco: Never  Vaping Use   Vaping status: Never Used  Substance Use Topics   Alcohol use: No    Comment: rare occaisons glass of wine   Drug use: No     Allergies   Patient has no known allergies.   Review of Systems Review of Systems  Skin:  Positive for wound.  Neurological:  Positive for numbness.  All other systems reviewed and are negative.    Physical Exam Triage Vital Signs ED Triage Vitals  Encounter Vitals Group     BP 08/04/24 1335 104/70     Girls Systolic BP Percentile --      Girls Diastolic BP Percentile --      Boys Systolic BP Percentile --      Boys Diastolic BP Percentile --      Pulse Rate 08/04/24 1335 97     Resp 08/04/24 1335 18     Temp 08/04/24 1335 97.7 F (36.5 C)     Temp Source 08/04/24 1335 Oral     SpO2 08/04/24 1335 98 %     Weight 08/04/24 1335 121 lb 14.6 oz (55.3 kg)     Height --      Head Circumference --      Peak Flow --      Pain Score 08/04/24 1332 0     Pain Loc --      Pain Education --      Exclude from Growth Chart --    No data found.  Updated Vital Signs BP 104/70 (BP Location: Right Arm)   Pulse 97   Temp 97.7 F (36.5 C) (Oral)   Resp 18   Wt 121 lb 14.6 oz (55.3 kg)   SpO2 98%   BMI 19.68 kg/m   Visual Acuity Right Eye Distance:   Left Eye Distance:    Bilateral Distance:    Right Eye Near:   Left Eye Near:    Bilateral Near:     Physical Exam Vitals reviewed.  Constitutional:      General: She is awake. She is not in acute distress.    Appearance: Normal appearance. She is well-developed. She is not ill-appearing, toxic-appearing or diaphoretic.  HENT:     Head: Normocephalic.  Right Ear: Hearing normal.     Left Ear: Hearing normal.     Nose: Nose normal.     Mouth/Throat:     Mouth: Mucous membranes are moist.  Eyes:     General: Vision grossly intact.     Conjunctiva/sclera: Conjunctivae normal.  Cardiovascular:     Rate and Rhythm: Normal rate and regular rhythm.     Heart sounds: Normal heart sounds.  Pulmonary:     Effort: Pulmonary effort is normal.     Breath sounds: Normal breath sounds and air entry.  Musculoskeletal:        General: Normal range of motion.     Cervical back: Normal range of motion and neck supple.     Comments: Swelling noted in the left upper arm extending into the dorsal aspect of the hand. The extremity demonstrates full strength, intact sensation, and preserved range of motion. Neurovascular status remains intact.  Skin:    General: Skin is warm and dry.     Findings: Wound present.     Comments: A small skin tear is present on the left upper arm just below the elbow. There is a scant amount of clear drainage from the site without surrounding erythema, induration, or fluctuance.  Neurological:     General: No focal deficit present.     Mental Status: She is alert and oriented to person, place, and time.  Psychiatric:        Speech: Speech normal.        Behavior: Behavior is cooperative.           UC Treatments / Results  Labs (all labs ordered are listed, but only abnormal results are displayed) Labs Reviewed - No data to display  EKG   Radiology No results found.  Procedures Procedures (including critical care time)  Medications Ordered in UC Medications - No  data to display  Initial Impression / Assessment and Plan / UC Course  I have reviewed the triage vital signs and the nursing notes.  Pertinent labs & imaging results that were available during my care of the patient were reviewed by me and considered in my medical decision making (see chart for details).     Patient presents for evaluation after recent hospitalization for extensive left upper extremity DVTs, 6 cm aortic aneurysm, and acute on chronic sinus tachycardia. She remains alert, oriented, nontoxic, and in no acute distress. Exam shows a small superficial skin tear on the left arm with scant serous drainage and no surrounding erythema. Noted swelling of the upper arm, slightly worsened after a tight dressing was applied at home, now improving after removal of the dressing in clinic. No indication for wound repair at this time. A nonadherent dressing with loose Coban wrap was placed over the wound, and the arm was supported with a comfortably snug Ace bandage to assist with swelling. Patient instructed to keep the dressing in place until her follow-up visit tomorrow with Dr. Geofm, elevate the arm to or above heart level, and continue Eliquis  as prescribed. Given clinical stability and established follow-up, she is safe for discharge with strict return precautions for worsening swelling, pain, bleeding, redness, fever, chest pain, or shortness of breath.  Today's evaluation has revealed no signs of a dangerous process. Discussed diagnosis with patient and/or guardian. Patient and/or guardian aware of their diagnosis, possible red flag symptoms to watch out for and need for close follow up. Patient and/or guardian understands verbal and written discharge instructions. Patient and/or guardian  comfortable with plan and disposition.  Patient and/or guardian has a clear mental status at this time, good insight into illness (after discussion and teaching) and has clear judgment to make decisions  regarding their care  Documentation was completed with the aid of voice recognition software. Transcription may contain typographical errors.  Final Clinical Impressions(s) / UC Diagnoses   Final diagnoses:  Skin tear of left upper arm without complication, initial encounter  Left arm swelling  Current long-term use of anticoagulant medication with history of deep venous thrombosis (DVT)  Acute deep vein thrombosis (DVT) of left upper extremity, unspecified vein (HCC)     Discharge Instructions      You were seen today for a small wound on your left arm. Your exam showed a very small skin tear with a small amount of clear drainage but no signs of infection. The swelling in your arm was likely made worse by a tight dressing that was applied at home. After removing that dressing in the clinic, your swelling has already started to improve. No stitches or repair are needed for the wound. A new non-stick dressing with a loose wrap was placed, along with an Ace bandage that is snug but not too tight to help control swelling. At home, keep the dressing and wrap in place until your follow-up appointment tomorrow morning with Dr. Geofm. Try to keep your arm elevated to the level of your heart or higher as much as possible to reduce swelling. Continue taking Eliquis  exactly as prescribed. Go to the emergency department right away if you notice worsening swelling, pain, redness, warmth, fever, bleeding, sudden shortness of breath, or chest pain.      ED Prescriptions   None    PDMP not reviewed this encounter.   Iola Lukes, [place] 08/04/24 1527

## 2024-08-04 NOTE — Patient Instructions (Incomplete)
   Elevate your left arm as much as possible    Follow up with Dr Sheree - Vascular surgery.      Medications changes include :   None     Return for folow up with Dr Joshua in 3-4 weeks.

## 2024-08-04 NOTE — Progress Notes (Unsigned)
 Subjective:    Patient ID: Lindsey [name], female    DOB: 08/06/1941, 83 y.o.   MRN: [id number]     HPI Marianne is here for follow up from the hospital.  She is here with her daughter who helps supplement the history.   Hospitalized 9/17-9/21.  She has a history of breast cancer s/p lumpectomy, radiation and tamoxifen  treatment.  She has a history of temporal arteritis s/p prednisone .  Went to urgent care 9/16 and was directed to the emergency room.  She presented with swelling in the left upper extremity which had progressively worsened over the past 4 days.  She denied trauma or recent procedures.  She did have some shortness of breath.  She also stated some left-sided headaches which have been going on for a few months.  She did not have any fevers or chills.  In the ED D-dimer was elevated.  Hgb 10.1.  Na 131. CT angio chest showed DVT in left subclavian and left internal jugular vein.  Ct scan also show asc aortic aneurysm measuring 6 cm.  ED doc discussed this with Dr Sheree  and Dr Kerrin with [facility]- advised to be placed on heparin  infusion and transferred to [facility].    Upper extremity ultrasound showed extensive DVTs of all veins involving the left arm.   Extensive left upper extremity DVTs, aortic aneurysm: CT surgery and vascular surgery consulted Started on heparin  drip Etiology of left upper extremity DVT likely from mass effect of the innominate vein due to large aortic aneurysm Patient refused thrombectomy as well as aneurysm repair Was on heparin  drip for 72 hours-left upper extremity swelling gradually started to improve Switched to Eliquis  5 mg twice daily  Acute on chronic sinus tachycardia: In hospital heart rate in the 150s on ambulation During hospitalization tachycardia on ambulation gradually improved Echocardiogram done-no evidence of tachycardia induced cardiomyopathy On discharge was still tachycardic on ambulation  Chronic mild anemia: Hemoglobin stable  around 10 while in the hospital  Headache, history of temporal arteritis: Headache not suggestive of temporal arteritis Tylenol  as needed  Discharged to home.  Yesterday went to [facility] for skin tear of LUE.  She brushed her arm up against the light switch and there is a small tear in her left forearm and it has been leaking fluid since then.   She denies any fevers or chills.  She does have shortness of breath with exertion which is new since this all started.  She denies any coughing or wheezing.  She does have some mild leg swelling which is not necessarily new.  She has had some mild lightheadedness at times.  She is very concerned about when the swelling in her left upper extremity will be down even if the leaking will stop.   Medications and allergies reviewed with patient and updated if appropriate.  Current Outpatient Medications on File Prior to Visit  Medication Sig Dispense Refill   APIXABAN  (ELIQUIS ) VTE STARTER PACK (10MG  AND 5MG ) Take as directed on package: start with two-5mg  tablets twice daily for 7 days. On day 8, switch to one-5mg  tablet twice daily. 74 each 0   ascorbic acid (VITAMIN C) 500 MG tablet Take 500 mg by mouth 3 (three) times a week.     B Complex Vitamins (VITAMIN B COMPLEX) TABS Take 1 tablet by mouth 3 (three) times a week.     calcium carbonate (OSCAL) 1500 (600 Ca) MG TABS tablet Take 1,500 mg by mouth 3 (three)  times a week.     cholecalciferol (VITAMIN D3) 25 MCG (1000 UNIT) tablet Take 1,000 Units by mouth 3 (three) times a week.     colestipol (COLESTID) 1 g tablet 2 tablets at night Orally Once a day; Duration: 30 days     predniSONE  (DELTASONE ) 20 MG tablet Take 60 mg by mouth daily.     Psyllium (METAMUCIL) 0.36 g CAPS as directed Orally     loperamide  (IMODIUM ) 2 MG capsule Take 1 capsule (2 mg total) by mouth 4 (four) times daily as needed for diarrhea or loose stools. (Patient not taking: Reported on 08/05/2024) 12 capsule 0   No current  facility-administered medications on file prior to visit.     Review of Systems  Constitutional:  Negative for chills and fever.  Respiratory:  Positive for shortness of breath (DOE - new - same). Negative for cough and wheezing.   Cardiovascular:  Positive for leg swelling (mild). Negative for chest pain and palpitations.  Neurological:  Positive for light-headedness (mild). Negative for headaches.       Objective:   Vitals:   08/05/24 1112  BP: 106/70  Pulse: (!) 120  Temp: 97.9 F (36.6 C)  SpO2: 97%   BP Readings from Last 3 Encounters:  08/05/24 106/70  08/04/24 104/70  07/31/24 (!) 94/59   Wt Readings from Last 3 Encounters:  08/05/24 129 lb (58.5 kg)  08/04/24 121 lb 14.6 oz (55.3 kg)  07/27/24 121 lb 14.6 oz (55.3 kg)   Body mass index is 20.82 kg/m.    Physical Exam Constitutional:      General: She is not in acute distress.    Appearance: Normal appearance.  HENT:     Head: Normocephalic and atraumatic.  Eyes:     Conjunctiva/sclera: Conjunctivae normal.  Cardiovascular:     Rate and Rhythm: Normal rate and regular rhythm.     Heart sounds: Normal heart sounds.  Pulmonary:     Effort: Pulmonary effort is normal. No respiratory distress.     Breath sounds: Normal breath sounds. No wheezing.  Musculoskeletal:     Cervical back: Neck supple.     Right lower leg: No edema.     Left lower leg: No edema.  Lymphadenopathy:     Cervical: No cervical adenopathy.  Skin:    General: Skin is warm and dry.     Findings: No rash.  Neurological:     Mental Status: She is alert. Mental status is at baseline.  Psychiatric:        Mood and Affect: Mood normal.        Behavior: Behavior normal.        Lab Results  Component Value Date   WBC 8.4 07/31/2024   HGB 9.9 (L) 07/31/2024   HCT 30.1 (L) 07/31/2024   PLT 457 (H) 07/31/2024   GLUCOSE 100 (H) 07/27/2024   CHOL 175 05/14/2022   TRIG 73.0 05/14/2022   HDL 73.90 05/14/2022   LDLCALC 86 05/14/2022    ALT 9 07/27/2024   AST 24 07/27/2024   NA 134 (L) 07/27/2024   K 3.7 07/27/2024   CL 97 (L) 07/27/2024   CREATININE 0.59 07/27/2024   BUN 8 07/27/2024   CO2 21 (L) 07/27/2024   TSH 2.760 07/27/2024   HGBA1C 5.9 07/21/2023     Assessment & Plan:    Encounter Diagnoses  Name Primary?   Aneurysm of ascending aorta without rupture Yes   Acute deep vein thrombosis (DVT) of  left upper extremity, unspecified vein (HCC)    Anemia, unspecified type    Tachycardia    Acute deep vein thrombosis of left upper extremity: Acute Left upper extremity and subclavian DVT With significant swelling of left upper extremity Secondary to compression from lower EXTR thoracic ascending aortic aneurysm On Eliquis  starter pack-Will need to continue Eliquis  potentially long-term given high risk Advised to call vascular and schedule an appointment for follow-up Advised elevating arm is much as possible  Skin tear left upper extremity Acute Tear is very minimal and is clean without evidence of infection Unfortunately this is leaking a large amount of clear fluid because of the edema in her wrist left upper extremity Wound wrapped Discussed they will need to change the bandage multiple times a day because of the amount of leakage Discussed the only way this will stop is when the swelling goes down  Ascending aortic aneurysm: New-seen on CT scan recently Very large-6 cm She deferred surgery when she talked to the cardiothoracic surgeon He does understand that if this ruptures it will be fatal   Tachycardia: Mild At this point would just monitor-no changes in medication now Recommended follow-up with PCP in 3-4 weeks  Anemia: With hospitalization Needs repeat CBC, but can do this in 3-4 weeks when she follows up with PCP On Eliquis  No symptoms consistent with active bleed  Encouraged her to be active inground is much as possible, but when sitting she needs to be elevating her left arm to  help with the swelling

## 2024-08-04 NOTE — Discharge Instructions (Addendum)
 You were seen today for a small wound on your left arm. Your exam showed a very small skin tear with a small amount of clear drainage but no signs of infection. The swelling in your arm was likely made worse by a tight dressing that was applied at home. After removing that dressing in the clinic, your swelling has already started to improve. No stitches or repair are needed for the wound. A new non-stick dressing with a loose wrap was placed, along with an Ace bandage that is snug but not too tight to help control swelling. At home, keep the dressing and wrap in place until your follow-up appointment tomorrow morning with Dr. Geofm. Try to keep your arm elevated to the level of your heart or higher as much as possible to reduce swelling. Continue taking Eliquis  exactly as prescribed. Go to the emergency department right away if you notice worsening swelling, pain, redness, warmth, fever, bleeding, sudden shortness of breath, or chest pain.

## 2024-08-05 ENCOUNTER — Ambulatory Visit (INDEPENDENT_AMBULATORY_CARE_PROVIDER_SITE_OTHER): Admitting: Internal Medicine

## 2024-08-05 VITALS — BP 106/70 | HR 120 | Temp 97.9°F | Ht 66.0 in | Wt 129.0 lb

## 2024-08-05 DIAGNOSIS — S41112A Laceration without foreign body of left upper arm, initial encounter: Secondary | ICD-10-CM | POA: Diagnosis not present

## 2024-08-05 DIAGNOSIS — I82622 Acute embolism and thrombosis of deep veins of left upper extremity: Secondary | ICD-10-CM | POA: Diagnosis not present

## 2024-08-05 DIAGNOSIS — D649 Anemia, unspecified: Secondary | ICD-10-CM

## 2024-08-05 DIAGNOSIS — I7121 Aneurysm of the ascending aorta, without rupture: Secondary | ICD-10-CM

## 2024-08-05 DIAGNOSIS — R000 Tachycardia, unspecified: Secondary | ICD-10-CM | POA: Diagnosis not present

## 2024-08-19 ENCOUNTER — Telehealth: Payer: Self-pay

## 2024-08-19 NOTE — Telephone Encounter (Signed)
 Copied from CRM [id number]. Topic: Clinical - Prescription Issue >> Aug 19, 2024  2:39 PM Lindsey [name] ORN wrote: Reason for CRM: Patient called requesting to be prescribed ELIQUIS  5 mg tablets by her PCP. Would like to take one tablet in the AM and one in the PM. Please call the patient first to discuss.

## 2024-08-19 NOTE — Telephone Encounter (Signed)
**Note De-identified  [place] Obfuscation** Please advise 

## 2024-08-20 ENCOUNTER — Other Ambulatory Visit: Payer: Self-pay | Admitting: Internal Medicine

## 2024-08-20 DIAGNOSIS — I82622 Acute embolism and thrombosis of deep veins of left upper extremity: Secondary | ICD-10-CM

## 2024-08-20 DIAGNOSIS — I82B12 Acute embolism and thrombosis of left subclavian vein: Secondary | ICD-10-CM | POA: Insufficient documentation

## 2024-08-20 MED ORDER — APIXABAN 5 MG PO TABS: 5.0000 mg | ORAL_TABLET | Freq: Two times a day (BID) | ORAL | 0 refills | Status: DC

## 2024-08-25 ENCOUNTER — Ambulatory Visit: Payer: Self-pay | Admitting: Internal Medicine

## 2024-08-25 DIAGNOSIS — I82622 Acute embolism and thrombosis of deep veins of left upper extremity: Secondary | ICD-10-CM

## 2024-08-25 DIAGNOSIS — I82B12 Acute embolism and thrombosis of left subclavian vein: Secondary | ICD-10-CM

## 2024-08-25 NOTE — Telephone Encounter (Signed)
 FYI Only or Action Required?: Action required by provider: medication refill request.  Patient was last seen in primary care on 08/05/2024 by Geofm Glade PARAS, MD.  Called Nurse Triage reporting Medication Refill ([facility] [address]. Pt requesting call back ).  Symptoms began today.  Interventions attempted: Prescription medications: eliquis .  Symptoms are: unchanged.  Triage Disposition: Call PCP Now  Patient/caregiver understands and will follow disposition?: Yes     Reason for Disposition  [1] Prescription refill request for ESSENTIAL medicine (i.e., likelihood of harm to patient if not taken) AND [2] triager unable to refill per department policy  Answer Assessment - Initial Assessment Questions Pt needs a new script for eliquis . Pt has called a couple of times and is getting anxious she is going to run out before she gets the medication. RN advised would send the order to MD.  Answer Assessment - Initial Assessment Questions Pt requesting new script for eliquis . It was prescribed in the hospital. Pt is upset it hasn't been sent in yet. Pt is having anxiety that it hasn't been sent it and she will run out before she gets a new script.  Protocols used: PCP Call - No Triage-A-AH, Medication Refill and Renewal Call-A-AH

## 2024-08-26 ENCOUNTER — Telehealth: Payer: Self-pay

## 2024-08-26 MED ORDER — APIXABAN 5 MG PO TABS
5.0000 mg | ORAL_TABLET | Freq: Two times a day (BID) | ORAL | 0 refills | Status: DC
Start: 2024-08-26 — End: 2024-09-15

## 2024-08-26 NOTE — Telephone Encounter (Signed)
 Copied from CRM [id number]. Topic: Clinical - Prescription Issue >> Aug 26, 2024  9:19 AM Berneda FALCON wrote: Reason for CRM: Daughter, Asberry is calling concerned that the Eloquis has not yet been refilled for this patient. Initial request was done 10/10, followed by pink word sent on 10/16 with no response from PCP.  Is this something we can assist her with, please?  Daughter's callback-  I could not get my phone to retrieve the call on hold so I could not confirm the callback number. If you speak to the patient or daughter, please apologize on my behalf. I attempted to call back but did not reach her.

## 2024-08-26 NOTE — Telephone Encounter (Signed)
>>   Aug 26, 2024  9:37 AM Montie POUR wrote: I transferred daughter, Asberry, to [employer]. [facility] did receive the order on 08/19/24 and was waiting for [place] to okay the copay. Asberry did speak with [facility]. No need to call Asberry back. Thanks

## 2024-09-01 NOTE — Telephone Encounter (Signed)
 Unable to schedule pt appt.

## 2024-09-07 ENCOUNTER — Ambulatory Visit: Admitting: Internal Medicine

## 2024-09-15 ENCOUNTER — Encounter: Payer: Self-pay | Admitting: Internal Medicine

## 2024-09-15 ENCOUNTER — Ambulatory Visit: Payer: Self-pay | Admitting: Internal Medicine

## 2024-09-15 ENCOUNTER — Ambulatory Visit: Admitting: Internal Medicine

## 2024-09-15 VITALS — BP 146/84 | HR 94 | Temp 98.5°F | Resp 16 | Ht 66.0 in | Wt 127.0 lb

## 2024-09-15 DIAGNOSIS — I10 Essential (primary) hypertension: Secondary | ICD-10-CM

## 2024-09-15 DIAGNOSIS — D75839 Thrombocytosis, unspecified: Secondary | ICD-10-CM | POA: Diagnosis not present

## 2024-09-15 DIAGNOSIS — Z23 Encounter for immunization: Secondary | ICD-10-CM | POA: Diagnosis not present

## 2024-09-15 DIAGNOSIS — I82B12 Acute embolism and thrombosis of left subclavian vein: Secondary | ICD-10-CM

## 2024-09-15 DIAGNOSIS — D508 Other iron deficiency anemias: Secondary | ICD-10-CM

## 2024-09-15 DIAGNOSIS — I82622 Acute embolism and thrombosis of deep veins of left upper extremity: Secondary | ICD-10-CM | POA: Insufficient documentation

## 2024-09-15 LAB — BASIC METABOLIC PANEL WITH GFR
BUN: 9 mg/dL (ref 6–23)
CO2: 25 meq/L (ref 19–32)
Calcium: 9.3 mg/dL (ref 8.4–10.5)
Chloride: 101 meq/L (ref 96–112)
Creatinine, Ser: 0.71 mg/dL (ref 0.40–1.20)
GFR: 78.32 mL/min (ref >60.00)
Glucose, Bld: 97 mg/dL (ref 70–99)
Potassium: 4.2 meq/L (ref 3.5–5.1)
Sodium: 136 meq/L (ref 135–145)

## 2024-09-15 LAB — IBC + FERRITIN
Ferritin: 67.7 ng/mL (ref 10.0–291.0)
Iron: 19 ug/dL — ABNORMAL LOW (ref 42–145)
Saturation Ratios: 5.2 % — ABNORMAL LOW (ref 20.0–50.0)
TIBC: 364 ug/dL (ref 250.0–450.0)
Transferrin: 260 mg/dL (ref 212.0–360.0)

## 2024-09-15 LAB — D-DIMER, QUANTITATIVE: D-Dimer, Quant: 0.91 ug{FEU}/mL — ABNORMAL HIGH (ref <0.50)

## 2024-09-15 LAB — CBC WITH DIFFERENTIAL/PLATELET
Basophils Absolute: 0.1 K/uL (ref 0.0–0.1)
Basophils Relative: 1.1 % (ref 0.0–3.0)
Eosinophils Absolute: 0.1 K/uL (ref 0.0–0.7)
Eosinophils Relative: 1.2 % (ref 0.0–5.0)
HCT: 38.2 % (ref 36.0–46.0)
Hemoglobin: 12.2 g/dL (ref 12.0–15.0)
Lymphocytes Relative: 16.3 % (ref 12.0–46.0)
Lymphs Abs: 1 K/uL (ref 0.7–4.0)
MCHC: 32 g/dL (ref 30.0–36.0)
MCV: 84.1 fl (ref 78.0–100.0)
Monocytes Absolute: 0.7 K/uL (ref 0.1–1.0)
Monocytes Relative: 11.3 % (ref 3.0–12.0)
Neutro Abs: 4.4 K/uL (ref 1.4–7.7)
Neutrophils Relative %: 70.1 % (ref 43.0–77.0)
Platelets: 334 K/uL (ref 150.0–400.0)
RBC: 4.54 Mil/uL (ref 3.87–5.11)
RDW: 15.5 % (ref 11.5–15.5)
WBC: 6.2 K/uL (ref 4.0–10.5)

## 2024-09-15 MED ORDER — APIXABAN 5 MG PO TABS: 5.0000 mg | ORAL_TABLET | Freq: Two times a day (BID) | ORAL | 1 refills | Status: AC

## 2024-09-15 MED ORDER — ACCRUFER 30 MG PO CAPS: 1.0000 | ORAL_CAPSULE | Freq: Two times a day (BID) | ORAL | 1 refills | Status: AC

## 2024-09-15 NOTE — Patient Instructions (Signed)
Iron Deficiency Anemia, Adult  Iron deficiency anemia is a condition in which the concentration of red blood cells or hemoglobin in the blood is below normal because of too little iron. Hemoglobin is a substance in red blood cells that carries oxygen to the body's tissues. When the concentration of red blood cells or hemoglobin is too low, not enough oxygen reaches these tissues. Iron deficiency anemia is usually long-lasting, and it develops over time. It may or may not cause symptoms. It is a common type of anemia. What are the causes? This condition may be caused by: Not enough iron in the diet. Abnormal absorption in the gut. Blood loss. What increases the risk? You are more likely to develop this condition if you get menstrual periods (menstruate) or are pregnant. What are the signs or symptoms? Symptoms of this condition may include: Pale skin, lips, and nail beds. Weakness, dizziness, and getting tired easily. Shortness of breath when moving or exercising. Cold hands or feet. Mild anemia may not cause any symptoms. How is this diagnosed? This condition is diagnosed based on: Your medical history. A physical exam. Blood tests. How is this treated? This condition is treated by correcting the cause of your iron deficiency. Treatment may involve: Adding iron-rich foods to your diet. Taking iron supplements. If you are pregnant or breastfeeding, you may need to take extra iron because your normal diet usually does not provide the amount of iron that you need. Increasing vitamin C intake. Vitamin C helps your body absorb iron. Your health care provider may recommend that you take iron supplements along with a glass of orange juice or a vitamin C supplement. Medicines to make heavy menstrual flow lighter. Surgery or additional testing procedures to determine the cause of your anemia. You may need repeat blood tests to determine whether treatment is working. If the treatment does not  seem to be working, you may need more tests. Follow these instructions at home: Medicines Take over-the-counter and prescription medicines only as told by your health care provider. This includes iron supplements and vitamins. This is important because too much iron can be harmful. For the best iron absorption, you should take iron supplements when your stomach is empty. If you cannot tolerate them on an empty stomach, you may need to take them with food. Do not drink milk or take antacids at the same time as your iron supplements. Milk and antacids may interfere with how your body absorbs iron. Iron supplements may turn stool (feces) a darker color and it may appear black. If you cannot tolerate taking iron supplements by mouth, talk with your health care provider about taking them through an IV or through an injection into a muscle. Eating and drinking Talk with your health care provider before changing your diet. Your provider may recommend that you eat foods that contain a lot of iron, such as: Liver. Low-fat (lean) beef. Breads and cereals that have iron added to them (are fortified). Eggs. Dried fruit. Dark green, leafy vegetables. To help your body use the iron from iron-rich foods, eat those foods at the same time as fresh fruits and vegetables that are high in vitamin C. Foods that are high in vitamin C include: Oranges. Peppers. Tomatoes. Mangoes. Managing constipation If you are taking an iron supplement, it may cause constipation. To prevent or treat constipation, you may need to: Drink enough fluid to keep your urine pale yellow. Take over-the-counter or prescription medicines. Eat foods that are high in fiber, such   as beans, whole grains, and fresh fruits and vegetables. Limit foods that are high in fat and processed sugars, such as fried or sweet foods. General instructions Return to your normal activities as told by your health care provider. Ask your health care provider  what activities are safe for you. Keep all follow-up visits. Contact a health care provider if: You feel nauseous or you vomit. You feel weak. You become light-headed when getting up from a sitting or lying down position. You have unexplained sweating. You develop symptoms of constipation. You have a heaviness in your chest. You have trouble breathing with physical activity. Get help right away if: You faint. If this happens, do not drive yourself to the hospital. You have an irregular or rapid heartbeat. Summary Iron deficiency anemia is a condition in which the concentration of red blood cells or hemoglobin in the blood is below normal because of too little iron. This condition is treated by correcting the cause of your iron deficiency. Take over-the-counter and prescription medicines only as told by your health care provider. This includes iron supplements and vitamins. To help your body use the iron from iron-rich foods, eat those foods at the same time as fresh fruits and vegetables that are high in vitamin C. Seek medical help if you have signs or symptoms of worsening anemia. This information is not intended to replace advice given to you by your health care provider. Make sure you discuss any questions you have with your health care provider. Document Revised: 12/04/2021 Document Reviewed: 12/04/2021 Elsevier Patient Education  2024 Elsevier Inc.  

## 2024-09-15 NOTE — Progress Notes (Signed)
 Subjective:  Patient ID: Lindsey [name], female    DOB: 10/08/1941  Age: 83 y.o. MRN: [id number]  CC: Anemia and Hypertension   HPI Lindsey [name] presents for f/up ----  Discussed the use of AI scribe software for clinical note transcription with the patient, who gave verbal consent to proceed.  History of Present Illness Lindsey [name] is an 83 year old female who presents for follow-up of blood clots.  In September 2025, she was diagnosed with a blood clot in her left upper arm. Initial investigations, including a chest x-ray and scans, ruled out pulmonary embolism, but a large blood clot was found in her left arm and another in the left side of her neck. She experienced significant swelling in her left arm from the shoulder to the knuckles, which prompted her to seek urgent care.  During her hospital stay, an aneurysm on her aorta was discovered. She is currently on blood thinners. She is concerned about the prescription expiring in January 2026. An ultrasound performed during her hospital stay confirmed the presence of blood clots.  She has not received a flu vaccine this year due to her hospitalization in September but is willing to receive it now.  She reports a history of elevated heart rates at home post-hospitalization, with resting rates around 139-140 bpm, which have since decreased. She denies any bleeding or significant bruising, though she notes small red marks under her skin.  She takes B complex, C, and D vitamins. She experiences swelling in her legs, particularly around the ankles, which she associates with wearing tight elastic socks.     Outpatient Medications Prior to Visit  Medication Sig Dispense Refill   ascorbic acid (VITAMIN C) 500 MG tablet Take 500 mg by mouth 3 (three) times a week.     B Complex Vitamins (VITAMIN B COMPLEX) TABS Take 1 tablet by mouth 3 (three) times a week.     calcium carbonate (OSCAL) 1500 (600 Ca) MG TABS tablet Take 1,500 mg by  mouth 3 (three) times a week.     cholecalciferol (VITAMIN D3) 25 MCG (1000 UNIT) tablet Take 1,000 Units by mouth 3 (three) times a week.     Psyllium (METAMUCIL) 0.36 g CAPS as directed Orally     apixaban  (ELIQUIS ) 5 MG TABS tablet Take 1 tablet (5 mg total) by mouth 2 (two) times daily. 180 tablet 0   colestipol (COLESTID) 1 g tablet 2 tablets at night Orally Once a day; Duration: 30 days     predniSONE  (DELTASONE ) 20 MG tablet Take 60 mg by mouth daily.     No facility-administered medications prior to visit.    ROS Review of Systems  Constitutional:  Negative for appetite change, chills, diaphoresis, fatigue and fever.  HENT:  Positive for hearing loss. Negative for sore throat and trouble swallowing.   Eyes: Negative.   Respiratory:  Negative for cough, chest tightness, shortness of breath and wheezing.   Cardiovascular:  Negative for chest pain, palpitations and leg swelling.  Gastrointestinal:  Negative for abdominal pain, constipation, diarrhea, nausea and vomiting.  Endocrine: Negative.   Genitourinary: Negative.  Negative for difficulty urinating.  Musculoskeletal: Negative.  Negative for joint swelling.  Skin: Negative.   Neurological: Negative.  Negative for dizziness and weakness.  Hematological:  Negative for adenopathy. Does not bruise/bleed easily.  Psychiatric/Behavioral:  Positive for confusion and decreased concentration. The patient is nervous/anxious.     Objective:  BP (!) 146/84 (BP Location: Left Arm, Patient  Position: Sitting, Cuff Size: Normal)   Pulse 94   Temp 98.5 F (36.9 C) (Oral)   Resp 16   Ht 5' 6 (1.676 m)   Wt 127 lb (57.6 kg)   SpO2 97%   BMI 20.50 kg/m   BP Readings from Last 3 Encounters:  09/15/24 (!) 146/84  08/05/24 106/70  08/04/24 104/70    Wt Readings from Last 3 Encounters:  09/15/24 127 lb (57.6 kg)  08/05/24 129 lb (58.5 kg)  08/04/24 121 lb 14.6 oz (55.3 kg)    Physical Exam Vitals reviewed.  Constitutional:       Appearance: Normal appearance.  HENT:     Nose: Nose normal.     Mouth/Throat:     Mouth: Mucous membranes are moist. Mucous membranes are pale.  Eyes:     General: No scleral icterus.    Conjunctiva/sclera: Conjunctivae normal.  Cardiovascular:     Rate and Rhythm: Normal rate and regular rhythm. Occasional Extrasystoles are present.    Pulses: Normal pulses.     Heart sounds: No murmur heard.    No friction rub. No gallop.  Pulmonary:     Effort: Pulmonary effort is normal.     Breath sounds: No stridor. No wheezing, rhonchi or rales.  Abdominal:     General: Abdomen is flat.     Palpations: There is no mass.     Tenderness: There is no abdominal tenderness. There is no guarding.     Hernia: No hernia is present.  Musculoskeletal:        General: Normal range of motion.     Cervical back: Neck supple.     Right lower leg: No edema.     Left lower leg: No edema.  Skin:    General: Skin is dry.  Neurological:     General: No focal deficit present.     Mental Status: She is alert. Mental status is at baseline.  Psychiatric:        Mood and Affect: Mood normal.        Behavior: Behavior normal.     Lab Results  Component Value Date   WBC 6.2 09/15/2024   HGB 12.2 09/15/2024   HCT 38.2 09/15/2024   PLT 334.0 09/15/2024   GLUCOSE 97 09/15/2024   CHOL 175 05/14/2022   TRIG 73.0 05/14/2022   HDL 73.90 05/14/2022   LDLCALC 86 05/14/2022   ALT 9 07/27/2024   AST 24 07/27/2024   NA 136 09/15/2024   K 4.2 09/15/2024   CL 101 09/15/2024   CREATININE 0.71 09/15/2024   BUN 9 09/15/2024   CO2 25 09/15/2024   TSH 2.760 07/27/2024   HGBA1C 5.9 07/21/2023    No results found.  Assessment & Plan:   Left subclavian vein thrombosis (HCC)- Will continue the DOAC for 6 months. -     Apixaban ; Take 1 tablet (5 mg total) by mouth 2 (two) times daily.  Dispense: 180 tablet; Refill: 1 -     D-dimer, quantitative; Future  Thrombocytosis -     IBC + Ferritin; Future -      CBC with Differential/Platelet; Future  Essential hypertension- BP is adequately well controlled. -     Basic metabolic panel with GFR; Future  Need for immunization against influenza -     Flu vaccine HIGH DOSE PF(Fluzone Trivalent)  Iron deficiency anemia secondary to inadequate dietary iron intake -     ACCRUFeR; Take 1 capsule (30 mg total) by mouth in  the morning and at bedtime.  Dispense: 180 capsule; Refill: 1     Follow-up: Return in about 4 months (around 01/13/2025).  Debby Molt, MD

## 2024-11-28 ENCOUNTER — Ambulatory Visit: Admitting: Family Medicine

## 2025-01-16 ENCOUNTER — Ambulatory Visit: Admitting: Internal Medicine
